# Patient Record
Sex: Female | Born: 1950 | Race: White | Hispanic: No | Marital: Married | Smoking: Former smoker
Health system: Southern US, Community
[De-identification: ages and names within clinical notes are randomized; demographics above are authoritative.]

## PROBLEM LIST (undated history)

## (undated) DIAGNOSIS — F419 Anxiety disorder, unspecified: Secondary | ICD-10-CM

## (undated) DIAGNOSIS — I10 Essential (primary) hypertension: Secondary | ICD-10-CM

## (undated) DIAGNOSIS — M1990 Unspecified osteoarthritis, unspecified site: Secondary | ICD-10-CM

## (undated) DIAGNOSIS — E7800 Pure hypercholesterolemia, unspecified: Secondary | ICD-10-CM

## (undated) DIAGNOSIS — N183 Chronic kidney disease, stage 3 unspecified: Secondary | ICD-10-CM

## (undated) DIAGNOSIS — E119 Type 2 diabetes mellitus without complications: Secondary | ICD-10-CM

## (undated) DIAGNOSIS — J45909 Unspecified asthma, uncomplicated: Secondary | ICD-10-CM

## (undated) HISTORY — DX: Chronic kidney disease, stage 3 unspecified: N18.30

---

## 1997-10-17 ENCOUNTER — Other Ambulatory Visit: Admission: RE | Admit: 1997-10-17 | Discharge: 1997-10-17 | Payer: Self-pay | Admitting: *Deleted

## 1998-11-12 ENCOUNTER — Other Ambulatory Visit: Admission: RE | Admit: 1998-11-12 | Discharge: 1998-11-12 | Payer: Self-pay | Admitting: *Deleted

## 1999-11-18 ENCOUNTER — Other Ambulatory Visit: Admission: RE | Admit: 1999-11-18 | Discharge: 1999-11-18 | Payer: Self-pay | Admitting: *Deleted

## 2000-11-23 ENCOUNTER — Other Ambulatory Visit: Admission: RE | Admit: 2000-11-23 | Discharge: 2000-11-23 | Payer: Self-pay | Admitting: *Deleted

## 2016-08-04 NOTE — Patient Instructions (Addendum)
Jeanne Medina  08/04/2016     @   Your procedure is scheduled on 08/18/2016.  Report to [facility]ina at 7:30 A.M.  Call this number if you have problems the morning of surgery:  [phone]   Remember:  Do not eat food or drink liquids after midnight.  Take these medicines the morning of surgery with A SIP OF WATER Amlodipine, Celexa   Do not wear jewelry, make-up or nail polish.  Do not wear lotions, powders, or perfumes, or deoderant.  Do not shave 48 hours prior to surgery.  Men may shave face and neck.  Do not bring valuables to the hospital.  [facility] is not responsible for any belongings or valuables.  Contacts, dentures or bridgework may not be worn into surgery.  Leave your suitcase in the car.  After surgery it may be brought to your room.  For patients admitted to the hospital, discharge time will be determined by your treatment team.  Patients discharged the day of surgery will not be allowed to drive home.    Please read over the following fact sheets that you were given. Anesthesia Post-op Instructions     PATIENT INSTRUCTIONS POST-ANESTHESIA  IMMEDIATELY FOLLOWING SURGERY:  Do not drive or operate machinery for the first twenty four hours after surgery.  Do not make any important decisions for twenty four hours after surgery or while taking narcotic pain medications or sedatives.  If you develop intractable nausea and vomiting or a severe headache please notify your doctor immediately.  FOLLOW-UP:  Please make an appointment with your surgeon as instructed. You do not need to follow up with anesthesia unless specifically instructed to do so.  WOUND CARE INSTRUCTIONS (if applicable):  Keep a dry clean dressing on the anesthesia/puncture wound site if there is drainage.  Once the wound has quit draining you may leave it open to air.  Generally you should leave the bandage intact for twenty four hours unless there is drainage.  If the  epidural site drains for more than 36-48 hours please call the anesthesia department.  QUESTIONS?:  Please feel free to call your physician or the hospital operator if you have any questions, and they will be happy to assist you.      Cataract Surgery Cataract surgery is a procedure to remove a cataract from your eye. A cataract is cloudiness on the lens of your eye. The lens focuses light inside the eye. When a lens becomes cloudy, your vision is affected. Cataract surgery is a procedure to remove the cloudy lens. A substitute lens (intraocular lens or IOL) is usually inserted as a replacement for the cloudy lens. Tell a health care provider about:  Any allergies you have.  All medicines you are taking, including vitamins, herbs, eye drops, creams, and over-the-counter medicines.  Any problems you or family members have had with anesthetic medicines.  Any blood disorders you have.  Any surgeries you have had, especially eye surgeries that include refractive surgery, such as PRK and LASIK.  Any medical conditions you have.  Whether you are pregnant or may be pregnant. What are the risks? Generally, this is a safe procedure. However, problems may occur, including:  Infection.  Bleeding.  Glaucoma.  Retinal detachment.  Allergic reactions to medicines.  Damage to other structures or organs.  Inflammation of the eye.  Clouding of the part of your eye that holds an IOL in place (after-cataract), if an IOL was inserted. This is fairly common.  An IOL moving out of position, if an IOL was inserted. This is very rare.  Loss of vision. This is rare. What happens before the procedure?  Follow instructions from your health care provider about eating or drinking restrictions.  Ask your health care provider about:  Changing or stopping your regular medicines, including any eye drops you have been prescribed. This is especially important if you are taking diabetes medicines or  blood thinners.  Taking medicines such as aspirin and ibuprofen. These medicines can thin your blood. Do not take these medicines before your procedure if your health care provider instructs you not to.  Do not put contact lenses in either eye on the day of your surgery.  Plan for someone to drive you to and from the procedure.  If you will be going home right after the procedure, plan to have someone with you for 24 hours. What happens during the procedure?  An IV tube may be inserted into one of your veins.  You will be given one or more of the following:  A medicine to help you relax (sedative).  A medicine to numb the area (local anesthetic). This may be numbing eye drops or an injection that is given behind the eye.  A small cut (incision) will be made to the edge of the clear, dome-shaped surface that covers the front of the eye (cornea).  A small probe will be inserted into the eye. This device gives off ultrasound waves that soften and break up the cloudy center of the lens. This makes it easier for the cloudy lens to be removed by suction.  An IOL may be implanted.  Part of the capsule that surrounds the lens will be left in the eye to support the IOL.  Your surgeon may use stitches (sutures) to close the incision. The procedure may vary among health care providers and hospitals. What happens after the procedure?  Your blood pressure, heart rate, breathing rate, and blood oxygen level will be monitored often until the medicines you were given have worn off.  You may be given a protective shield to wear over your eyes.  Do not drive for 24 hours if you received a sedative. This information is not intended to replace advice given to you by your health care provider. Make sure you discuss any questions you have with your health care provider. Document Released: 04/02/2011 Document Revised: 09/19/2015 Document Reviewed: 02/21/2015 Elsevier Interactive Patient Education   2017 [employer].

## 2016-08-10 ENCOUNTER — Encounter (HOSPITAL_COMMUNITY)
Admission: RE | Admit: 2016-08-10 | Discharge: 2016-08-10 | Disposition: A | Discharge status: Home / Self Care | Payer: Medicare Other | Source: Ambulatory Visit | Attending: Ophthalmology | Admitting: Ophthalmology

## 2016-08-10 ENCOUNTER — Encounter (HOSPITAL_COMMUNITY): Payer: Self-pay

## 2016-08-10 DIAGNOSIS — Z01810 Encounter for preprocedural cardiovascular examination: Secondary | ICD-10-CM | POA: Insufficient documentation

## 2016-08-10 DIAGNOSIS — Z01812 Encounter for preprocedural laboratory examination: Secondary | ICD-10-CM | POA: Insufficient documentation

## 2016-08-10 DIAGNOSIS — H269 Unspecified cataract: Secondary | ICD-10-CM | POA: Insufficient documentation

## 2016-08-12 DIAGNOSIS — Z01810 Encounter for preprocedural cardiovascular examination: Secondary | ICD-10-CM | POA: Diagnosis present

## 2016-08-12 DIAGNOSIS — Z01812 Encounter for preprocedural laboratory examination: Secondary | ICD-10-CM | POA: Diagnosis present

## 2016-08-12 DIAGNOSIS — H269 Unspecified cataract: Secondary | ICD-10-CM | POA: Diagnosis not present

## 2016-08-12 MED ORDER — KETOROLAC TROMETHAMINE 0.5 % OP SOLN: 1.0000 [drp] | OPHTHALMIC | Status: DC

## 2016-08-12 MED ORDER — CYCLOPENTOLATE-PHENYLEPHRINE 0.2-1 % OP SOLN: 1.0000 [drp] | OPHTHALMIC | Status: DC

## 2016-08-12 MED ORDER — PHENYLEPHRINE HCL 2.5 % OP SOLN: 1.0000 [drp] | OPHTHALMIC | Status: DC

## 2016-08-12 MED ORDER — TETRACAINE HCL 0.5 % OP SOLN: 1.0000 [drp] | OPHTHALMIC | Status: DC

## 2016-08-12 NOTE — Patient Instructions (Signed)
Your procedure is scheduled on: 08/18/2016  Report to [facility] at  730  AM.  Call this number if you have problems the morning of surgery: [phone]   Do not eat food or drink liquids :After Midnight.      Take these medicines the morning of surgery with A SIP OF WATER: amlodipine, celexa, lisinopril.   Do not wear jewelry, make-up or nail polish.  Do not wear lotions, powders, or perfumes. You may wear deodorant.  Do not shave 48 hours prior to surgery.  Do not bring valuables to the hospital.  Contacts, dentures or bridgework may not be worn into surgery.  Leave suitcase in the car. After surgery it may be brought to your room.  For patients admitted to the hospital, checkout time is 11:00 AM the day of discharge.   Patients discharged the day of surgery will not be allowed to drive home.  :     Please read over the following fact sheets that you were given: Coughing and Deep Breathing, Surgical Site Infection Prevention, Anesthesia Post-op Instructions and Care and Recovery After Surgery    Cataract A cataract is a clouding of the lens of the eye. When a lens becomes cloudy, vision is reduced based on the degree and nature of the clouding. Many cataracts reduce vision to some degree. Some cataracts make people more near-sighted as they develop. Other cataracts increase glare. Cataracts that are ignored and become worse can sometimes look white. The white color can be seen through the pupil. CAUSES   Aging. However, cataracts may occur at any age, even in newborns.   Certain drugs.   Trauma to the eye.   Certain diseases such as diabetes.   Specific eye diseases such as chronic inflammation inside the eye or a sudden attack of a rare form of glaucoma.   Inherited or acquired medical problems.  SYMPTOMS   Gradual, progressive drop in vision in the affected eye.   Severe, rapid visual loss. This most often happens when trauma is the cause.  DIAGNOSIS  To detect a  cataract, an eye doctor examines the lens. Cataracts are best diagnosed with an exam of the eyes with the pupils enlarged (dilated) by drops.  TREATMENT  For an early cataract, vision may improve by using different eyeglasses or stronger lighting. If that does not help your vision, surgery is the only effective treatment. A cataract needs to be surgically removed when vision loss interferes with your everyday activities, such as driving, reading, or watching TV. A cataract may also have to be removed if it prevents examination or treatment of another eye problem. Surgery removes the cloudy lens and usually replaces it with a substitute lens (intraocular lens, IOL).  At a time when both you and your doctor agree, the cataract will be surgically removed. If you have cataracts in both eyes, only one is usually removed at a time. This allows the operated eye to heal and be out of danger from any possible problems after surgery (such as infection or poor wound healing). In rare cases, a cataract may be doing damage to your eye. In these cases, your caregiver may advise surgical removal right away. The vast majority of people who have cataract surgery have better vision afterward. HOME CARE INSTRUCTIONS  If you are not planning surgery, you may be asked to do the following:  Use different eyeglasses.   Use stronger or brighter lighting.   Ask your eye doctor about reducing your medicine  dose or changing medicines if it is thought that a medicine caused your cataract. Changing medicines does not make the cataract go away on its own.   Become familiar with your surroundings. Poor vision can lead to injury. Avoid bumping into things on the affected side. You are at a higher risk for tripping or falling.   Exercise extreme care when driving or operating machinery.   Wear sunglasses if you are sensitive to bright light or experiencing problems with glare.  SEEK IMMEDIATE MEDICAL CARE IF:   You have a  worsening or sudden vision loss.   You notice redness, swelling, or increasing pain in the eye.   You have a fever.  Document Released: 04/13/2005 Document Revised: 04/02/2011 Document Reviewed: 12/05/2010 [facility] Patient Information 2012 [place].PATIENT INSTRUCTIONS POST-ANESTHESIA  IMMEDIATELY FOLLOWING SURGERY:  Do not drive or operate machinery for the first twenty four hours after surgery.  Do not make any important decisions for twenty four hours after surgery or while taking narcotic pain medications or sedatives.  If you develop intractable nausea and vomiting or a severe headache please notify your doctor immediately.  FOLLOW-UP:  Please make an appointment with your surgeon as instructed. You do not need to follow up with anesthesia unless specifically instructed to do so.  WOUND CARE INSTRUCTIONS (if applicable):  Keep a dry clean dressing on the anesthesia/puncture wound site if there is drainage.  Once the wound has quit draining you may leave it open to air.  Generally you should leave the bandage intact for twenty four hours unless there is drainage.  If the epidural site drains for more than 36-48 hours please call the anesthesia department.  QUESTIONS?:  Please feel free to call your physician or the hospital operator if you have any questions, and they will be happy to assist you.

## 2016-08-13 ENCOUNTER — Encounter (HOSPITAL_COMMUNITY): Payer: Self-pay

## 2016-08-13 ENCOUNTER — Other Ambulatory Visit: Payer: Self-pay

## 2016-08-13 ENCOUNTER — Encounter (HOSPITAL_COMMUNITY)
Admission: RE | Admit: 2016-08-13 | Discharge: 2016-08-13 | Disposition: A | Discharge status: Home / Self Care | Payer: Medicare Other | Source: Ambulatory Visit | Attending: Ophthalmology | Admitting: Ophthalmology

## 2016-08-13 DIAGNOSIS — Z01812 Encounter for preprocedural laboratory examination: Secondary | ICD-10-CM | POA: Diagnosis not present

## 2016-08-13 DIAGNOSIS — H269 Unspecified cataract: Secondary | ICD-10-CM | POA: Diagnosis not present

## 2016-08-13 DIAGNOSIS — Z01810 Encounter for preprocedural cardiovascular examination: Secondary | ICD-10-CM | POA: Diagnosis not present

## 2016-08-13 HISTORY — DX: Anxiety disorder, unspecified: F41.9

## 2016-08-13 HISTORY — DX: Unspecified asthma, uncomplicated: J45.909

## 2016-08-13 HISTORY — DX: Unspecified osteoarthritis, unspecified site: M19.90

## 2016-08-13 HISTORY — DX: Pure hypercholesterolemia, unspecified: E78.00

## 2016-08-13 HISTORY — DX: Type 2 diabetes mellitus without complications: E11.9

## 2016-08-13 HISTORY — DX: Essential (primary) hypertension: I10

## 2016-08-13 LAB — CBC WITH DIFFERENTIAL/PLATELET
BASOS ABS: 0.1 10*3/uL (ref 0.0–0.1)
Basophils Relative: 1 %
EOS PCT: 5 %
Eosinophils Absolute: 0.5 10*3/uL (ref 0.0–0.7)
HEMATOCRIT: 40.3 % (ref 36.0–46.0)
HEMOGLOBIN: 13.6 g/dL (ref 12.0–15.0)
LYMPHS ABS: 2.9 10*3/uL (ref 0.7–4.0)
LYMPHS PCT: 27 %
MCH: 29.1 pg (ref 26.0–34.0)
MCHC: 33.7 g/dL (ref 30.0–36.0)
MCV: 86.3 fL (ref 78.0–100.0)
Monocytes Absolute: 0.7 10*3/uL (ref 0.1–1.0)
Monocytes Relative: 7 %
NEUTROS ABS: 6.4 10*3/uL (ref 1.7–7.7)
Neutrophils Relative %: 60 %
Platelets: 422 10*3/uL — ABNORMAL HIGH (ref 150–400)
RBC: 4.67 MIL/uL (ref 3.87–5.11)
RDW: 13.4 % (ref 11.5–15.5)
WBC: 10.5 10*3/uL (ref 4.0–10.5)

## 2016-08-13 LAB — BASIC METABOLIC PANEL
ANION GAP: 12 (ref 5–15)
BUN: 16 mg/dL (ref 6–20)
CHLORIDE: 100 mmol/L — AB (ref 101–111)
CO2: 25 mmol/L (ref 22–32)
Calcium: 9.5 mg/dL (ref 8.9–10.3)
Creatinine, Ser: 0.97 mg/dL (ref 0.44–1.00)
GFR calc Af Amer: >60 mL/min (ref >60)
GFR, EST NON AFRICAN AMERICAN: 60 mL/min — AB (ref >60)
GLUCOSE: 198 mg/dL — AB (ref 65–99)
POTASSIUM: 4 mmol/L (ref 3.5–5.1)
Sodium: 137 mmol/L (ref 135–145)

## 2016-08-18 ENCOUNTER — Encounter (HOSPITAL_COMMUNITY): Payer: Self-pay | Admitting: *Deleted

## 2016-08-18 ENCOUNTER — Ambulatory Visit (HOSPITAL_COMMUNITY)
Admission: RE | Admit: 2016-08-18 | Discharge: 2016-08-18 | Disposition: A | Discharge status: Home / Self Care | Payer: Medicare Other | Source: Ambulatory Visit | Attending: Ophthalmology | Admitting: Ophthalmology

## 2016-08-18 ENCOUNTER — Ambulatory Visit (HOSPITAL_COMMUNITY): Payer: Medicare Other | Admitting: Anesthesiology

## 2016-08-18 ENCOUNTER — Encounter (HOSPITAL_COMMUNITY)
Admission: RE | Disposition: A | Discharge status: Home / Self Care | Payer: Self-pay | Source: Ambulatory Visit | Attending: Ophthalmology

## 2016-08-18 DIAGNOSIS — Z87891 Personal history of nicotine dependence: Secondary | ICD-10-CM | POA: Diagnosis not present

## 2016-08-18 DIAGNOSIS — I10 Essential (primary) hypertension: Secondary | ICD-10-CM | POA: Diagnosis not present

## 2016-08-18 DIAGNOSIS — Z79899 Other long term (current) drug therapy: Secondary | ICD-10-CM | POA: Insufficient documentation

## 2016-08-18 DIAGNOSIS — F419 Anxiety disorder, unspecified: Secondary | ICD-10-CM | POA: Diagnosis not present

## 2016-08-18 DIAGNOSIS — J45909 Unspecified asthma, uncomplicated: Secondary | ICD-10-CM | POA: Insufficient documentation

## 2016-08-18 DIAGNOSIS — E1136 Type 2 diabetes mellitus with diabetic cataract: Secondary | ICD-10-CM | POA: Diagnosis not present

## 2016-08-18 HISTORY — PX: CATARACT EXTRACTION W/PHACO: SHX586

## 2016-08-18 LAB — GLUCOSE, CAPILLARY: GLUCOSE-CAPILLARY: 137 mg/dL — AB (ref 65–99)

## 2016-08-18 SURGERY — PHACOEMULSIFICATION, CATARACT, WITH IOL INSERTION
Anesthesia: Monitor Anesthesia Care | Site: Eye | Laterality: Left

## 2016-08-18 MED ORDER — FENTANYL CITRATE (PF) 100 MCG/2ML IJ SOLN
INTRAMUSCULAR | Status: AC
  Filled 2016-08-18: qty 2

## 2016-08-18 MED ORDER — TETRACAINE HCL 0.5 % OP SOLN
1.0000 [drp] | OPHTHALMIC | Status: AC
  Administered 2016-08-18 (×3): 1 [drp] via OPHTHALMIC

## 2016-08-18 MED ORDER — LIDOCAINE HCL (PF) 1 % IJ SOLN
INTRAMUSCULAR | Status: DC | PRN
  Administered 2016-08-18: 1 mL

## 2016-08-18 MED ORDER — CYCLOPENTOLATE-PHENYLEPHRINE 0.2-1 % OP SOLN
1.0000 [drp] | OPHTHALMIC | Status: AC
Start: 2016-08-18 — End: 2016-08-18
  Administered 2016-08-18 (×3): 1 [drp] via OPHTHALMIC

## 2016-08-18 MED ORDER — PROVISC 10 MG/ML IO SOLN
INTRAOCULAR | Status: DC | PRN
  Administered 2016-08-18: 0.85 mL via INTRAOCULAR

## 2016-08-18 MED ORDER — EPINEPHRINE PF 1 MG/ML IJ SOLN
INTRAMUSCULAR | Status: DC | PRN
  Administered 2016-08-18: 09:00:00

## 2016-08-18 MED ORDER — MIDAZOLAM HCL 2 MG/2ML IJ SOLN
INTRAMUSCULAR | Status: AC
  Filled 2016-08-18: qty 2

## 2016-08-18 MED ORDER — LIDOCAINE HCL (PF) 1 % IJ SOLN
INTRAMUSCULAR | Status: AC
Start: 2016-08-18 — End: ?
  Filled 2016-08-18: qty 2

## 2016-08-18 MED ORDER — LACTATED RINGERS IV SOLN
INTRAVENOUS | Status: DC
  Administered 2016-08-18: 09:00:00 via INTRAVENOUS

## 2016-08-18 MED ORDER — PHENYLEPHRINE HCL 2.5 % OP SOLN
1.0000 [drp] | OPHTHALMIC | Status: AC
  Administered 2016-08-18 (×3): 1 [drp] via OPHTHALMIC

## 2016-08-18 MED ORDER — MIDAZOLAM HCL 2 MG/2ML IJ SOLN
1.0000 mg | INTRAMUSCULAR | Status: AC
Start: 2016-08-18 — End: 2016-08-18
  Administered 2016-08-18: 2 mg via INTRAVENOUS

## 2016-08-18 MED ORDER — FENTANYL CITRATE (PF) 100 MCG/2ML IJ SOLN
25.0000 ug | Freq: Once | INTRAMUSCULAR | Status: AC
  Administered 2016-08-18: 25 ug via INTRAVENOUS

## 2016-08-18 MED ORDER — KETOROLAC TROMETHAMINE 0.5 % OP SOLN
1.0000 [drp] | OPHTHALMIC | Status: AC
  Administered 2016-08-18 (×3): 1 [drp] via OPHTHALMIC

## 2016-08-18 MED ORDER — BSS IO SOLN
INTRAOCULAR | Status: DC | PRN
  Administered 2016-08-18: 15 mL via INTRAOCULAR

## 2016-08-18 SURGICAL SUPPLY — 9 items
CLOTH BEACON ORANGE TIMEOUT ST (SAFETY) ×2 IMPLANT
EYE SHIELD UNIVERSAL CLEAR (GAUZE/BANDAGES/DRESSINGS) ×2 IMPLANT
GLOVE EXAM NITRILE MD LF STRL (GLOVE) ×2 IMPLANT
LENS ALC ACRYL/TECN (Ophthalmic Related) ×3 IMPLANT
PAD ARMBOARD 7.5X6 YLW CONV (MISCELLANEOUS) ×2 IMPLANT
RING MALYGIN (MISCELLANEOUS) ×2 IMPLANT
TAPE SURG TRANSPARENT 2IN (GAUZE/BANDAGES/DRESSINGS) IMPLANT
TAPE TRANSPARENT 2IN (GAUZE/BANDAGES/DRESSINGS) ×2
WATER STERILE IRR 250ML POUR (IV SOLUTION) ×2 IMPLANT

## 2016-08-18 NOTE — Discharge Instructions (Signed)
PATIENT INSTRUCTIONS °POST-ANESTHESIA ° °IMMEDIATELY FOLLOWING SURGERY:  Do not drive or operate machinery for the first twenty four hours after surgery.  Do not make any important decisions for twenty four hours after surgery or while taking narcotic pain medications or sedatives.  If you develop intractable nausea and vomiting or a severe headache please notify your doctor immediately. ° °FOLLOW-UP:  Please make an appointment with your surgeon as instructed. You do not need to follow up with anesthesia unless specifically instructed to do so. ° °WOUND CARE INSTRUCTIONS (if applicable):  Keep a dry clean dressing on the anesthesia/puncture wound site if there is drainage.  Once the wound has quit draining you may leave it open to air.  Generally you should leave the bandage intact for twenty four hours unless there is drainage.  If the epidural site drains for more than 36-48 hours please call the anesthesia department. ° ° ° °Cataract °A cataract is cloudiness on the lens of your eye. The lens is the clear part of your eye that is behind your iris and pupil. The lens focuses light on the retina, which lets you see clearly. °When a lens becomes cloudy, vision may become blurry. The clouding can range from a tiny dot to complete cloudiness. As some cataracts develop, they make a person more nearsighted. Other cataracts increase glare. Cataracts can worsen over time, and sometimes the pupil can look white. Cataracts get bigger and they cloud more of the lens, making it difficult to see. Cataracts can affect one eye or both eyes. °What are the causes? °Most cataracts are associated with age-related eye changes. The eye lens is mostly made up of water and protein. Normally, this protein is arranged in a way that keeps the lens clear. Cataracts develop when protein begins to clump together over time. This clouds the lens and lets less light pass through to the retina, which causes blurry vision. °What increases the  risk? °This condition is more likely to develop in people who: °· Are 60 years of age or older. °· Have diabetes. °· Have high blood pressure. °· Take certain medicines, such as steroids or hormone replacement therapy. °· Have had an eye injury. °· Have or have had eye inflammation. °· Have a family history of cataracts. °· Smoke. °· Drink alcohol heavily. °· Are frequently exposed to sun or very strong light without eye protection. °· Are obese. °· Have been exposed to large amounts of radiation, lead, or other toxic substances. °· Have had eye surgery. °What are the signs or symptoms? °The main symptom of a cataract is blurry vision. Your vision may change or get worse over time. Other symptoms include: °· Increased glare. °· Seeing a bright ring or halo around light. °· Poor night vision. °· Double vision in one eye. °· Having trouble seeing, even while wearing contact lenses or glasses. °· Seeing colors that appear faded. °· Trouble telling the difference between blue and purple. °· Needing frequent changes to your prescription glasses or contacts. °How is this diagnosed? °This condition is diagnosed with a medical history and eye exam. You may need to see an eye specialist (optometrist or ophthalmologist). Your health care provider may enlarge (dilate) your pupils with eye drops to see the back of your eye more clearly and look for signs of cataracts or other damage. °You may also have tests, including: °· A visual acuity test. This uses a chart to determine the smallest letters that you can see from a specific distance. °·   A slit-lamp exam. This uses a microscope to examine small sections of your eye for abnormalities. °· Tonometry. This test measures the pressure of the fluid inside your eye. °How is this treated? °Treatment depends on the stage of your cataract. For an early cataract, vision may improve by using different eyeglasses or stronger lighting. If that does not help your vision, surgery may be  recommended to remove the cataract. °If your health care provider thinks your cataract may be linked to any medicines that you are taking, he or she may change your medicines. °Follow these instructions at home: °Lifestyle  °· Use stronger or brighter lighting. °· Consider using a magnifying glass for reading or other activities. °· Become familiar with your surroundings. Having poor vision can put you at a greater risk for tripping, falling, or bumping into things. °· Wear sunglasses and a hat if you are sensitive to bright light or are having problems with glare. °· Quit smoking if you smoke. If you need help quitting, talk with your health care provider. °General instructions  °· If you are prescribed new eyeglasses, wear them as told by your health care provider. °· Take over-the-counter and prescription medicines only as told by your health care provider. Do not change your medicines unless told by your health care provider. °· Do not drive or operate heavy machinery if your vision is blurry, particularly at night. °· Keep your blood sugar under control, if you have diabetes. °· Keep all follow-up visits as told by your health care provider. This is important. °Contact a health care provider if: °· Your symptoms get worse. °· Your vision affects your ability to perform daily activities. °· You have new symptoms. °· You have a fever. °Get help right away if: °· You have sudden vision loss. °· You have redness, swelling, or increasing pain in your eye. °· You develop a headache and sensitivity to light. °This information is not intended to replace advice given to you by your health care provider. Make sure you discuss any questions you have with your health care provider. °Document Released: 04/13/2005 Document Revised: 08/22/2015 Document Reviewed: 10/17/2014 °Elsevier Interactive Patient Education © 2017 [employer] ° °QUESTIONS?:  Please feel free to call your physician or the hospital operator if you have  any questions, and they will be happy to assist you.    ° °  °  °        [facility] Instructions °[address]- [place] [address] °    ° °1. Avoid closing eyes tightly. One often closes the eye tightly when laughing, talking, sneezing, coughing or if they feel irritated. At these times, you should be careful not to close your eyes tightly. ° °2. Instill eye drops as instructed. To instill drops in your eye, open it, look up and have someone gently pull the lower lid down and instill a couple of drops inside the lower lid. ° °3. Do not touch upper lid. ° °4. Take Advil or Tylenol for pain. ° °5. You may use either eye for near work, such as reading or sewing and you may watch television. ° °6. You may have your hair done at the beauty parlor at any time. ° °7. Wear dark glasses with or without your own glasses if you are in bright light. ° °8. Call our office at [phone] or [phone] if you have sharp pain in your eye or unusual symptoms. ° °9.  FOLLOW UP WITH DR. SHAPIRO   TODAY IN HIS [place] OFFICE AT 3:15 pm. ° °  °I have received a copy of the above instructions and will follow them.  ° ° ° °IF YOU ARE IN IMMEDIATE DANGER CALL 911! ° °It is important for you to keep your follow-up appointment with your physician after discharge, OR, for you /your caregiver to make a follow-up appointment with your physician / medical provider after discharge. ° °Show these instructions to the next healthcare provider you see. ° ° ° °

## 2016-08-18 NOTE — Anesthesia Preprocedure Evaluation (Signed)
Anesthesia Evaluation  Patient identified by MRN, date of birth, ID band Patient awake    Reviewed: Allergy & Precautions, NPO status , Patient's Chart, lab work & pertinent test results  Airway Mallampati: II  TM Distance: >3 FB     Dental  (+) Teeth Intact   Pulmonary asthma , former smoker,    breath sounds clear to auscultation       Cardiovascular hypertension, Pt. on medications  Rhythm:Regular Rate:Normal     Neuro/Psych PSYCHIATRIC DISORDERS Anxiety    GI/Hepatic negative GI ROS,   Endo/Other  diabetes, Type 2  Renal/GU      Musculoskeletal   Abdominal   Peds  Hematology   Anesthesia Other Findings   Reproductive/Obstetrics                             Anesthesia Physical Anesthesia Plan  ASA: III  Anesthesia Plan: MAC   Post-op Pain Management:    Induction: Intravenous  Airway Management Planned: Nasal Cannula  Additional Equipment:   Intra-op Plan:   Post-operative Plan:   Informed Consent: I have reviewed the patients History and Physical, chart, labs and discussed the procedure including the risks, benefits and alternatives for the proposed anesthesia with the patient or authorized representative who has indicated his/her understanding and acceptance.     Plan Discussed with:   Anesthesia Plan Comments:         Anesthesia Quick Evaluation  

## 2016-08-18 NOTE — H&P (Signed)
The patient was re examined and there is no change in the patients condition since the original H and P. 

## 2016-08-18 NOTE — Transfer of Care (Signed)
Immediate Anesthesia Transfer of Care Note  Patient: Jeanne Medina  Procedure(s) Performed: Procedure(s) with comments: CATARACT EXTRACTION PHACO AND INTRAOCULAR LENS PLACEMENT (IOC) (Left) - CDE: 6.05  Patient Location: Short Stay  Anesthesia Type:MAC  Level of Consciousness: awake and alert   Airway & Oxygen Therapy: Patient Spontanous Breathing  Post-op Assessment: Report given to RN  Post vital signs: Reviewed  Last Vitals:  Vitals:   08/18/16 0915 08/18/16 0920  BP: 124/71 (!) 148/67  Resp: 11 19  Temp:      Last Pain:  Vitals:   08/18/16 0848  TempSrc: Oral         Complications: No apparent anesthesia complications

## 2016-08-18 NOTE — Anesthesia Postprocedure Evaluation (Signed)
Anesthesia Post Note  Patient: Jeanne Medina  Procedure(s) Performed: Procedure(s) (LRB): CATARACT EXTRACTION PHACO AND INTRAOCULAR LENS PLACEMENT (IOC) (Left)  Patient location during evaluation: Short Stay Anesthesia Type: MAC Level of consciousness: awake and alert and oriented Pain management: pain level controlled Vital Signs Assessment: post-procedure vital signs reviewed and stable Respiratory status: spontaneous breathing Cardiovascular status: blood pressure returned to baseline Postop Assessment: no signs of nausea or vomiting Anesthetic complications: no     Last Vitals:  Vitals:   08/18/16 0915 08/18/16 0920  BP: 124/71 (!) 148/67  Resp: 11 19  Temp:      Last Pain:  Vitals:   08/18/16 0848  TempSrc: Oral                 Paralee Pendergrass

## 2016-08-18 NOTE — Op Note (Signed)
Patient brought to the operating room and prepped and draped in the usual manner.  Lid speculum inserted in left eye.  Stab incision made at the twelve o'clock position.  Intraocular Xylocaine instilled. Provisc instilled in the anterior chamber.   A 2.4 mm. Stab incision was made temporally. Due to a small pupil, a Malugyn Ring was inserted.  An anterior capsulotomy was done with a bent 25 gauge needle.  The nucleus was hydrodissected.  The Phaco tip was inserted in the anterior chamber and the nucleus was emulsified.  CDE was 6.05.  The cortical material was then removed with the I and A tip.  Posterior capsule was the polished.  The anterior chamber was deepened with Provisc.  A 19.5 Diopter Alcon [id number] IOL was then inserted in the capsular bag.  Provisc was then removed with the I and A tip.  The wound was then hydrated.  Patient sent to the Recovery Room in good condition with follow up in my office.  Preoperative Diagnosis:  Nuclear and PSC Cataract OS Postoperative Diagnosis:  Same Procedure name: Kelman Phacoemulsification OS with  IOL

## 2016-08-19 ENCOUNTER — Encounter (HOSPITAL_COMMUNITY): Payer: Self-pay | Admitting: Ophthalmology

## 2016-08-27 ENCOUNTER — Encounter (HOSPITAL_COMMUNITY)
Admission: RE | Admit: 2016-08-27 | Discharge: 2016-08-27 | Disposition: A | Discharge status: Home / Self Care | Payer: Medicare Other | Source: Ambulatory Visit | Attending: Ophthalmology | Admitting: Ophthalmology

## 2016-08-27 ENCOUNTER — Encounter (HOSPITAL_COMMUNITY): Payer: Self-pay

## 2016-08-27 ENCOUNTER — Inpatient Hospital Stay (HOSPITAL_COMMUNITY): Admission: RE | Admit: 2016-08-27 | Payer: Self-pay | Source: Ambulatory Visit

## 2016-08-31 MED ORDER — KETOROLAC TROMETHAMINE 0.5 % OP SOLN
OPHTHALMIC | Status: AC
  Filled 2016-08-31: qty 5

## 2016-08-31 MED ORDER — PHENYLEPHRINE HCL 2.5 % OP SOLN
OPHTHALMIC | Status: AC
  Filled 2016-08-31: qty 15

## 2016-08-31 MED ORDER — CYCLOPENTOLATE-PHENYLEPHRINE 0.2-1 % OP SOLN
OPHTHALMIC | Status: AC
  Filled 2016-08-31: qty 2

## 2016-08-31 MED ORDER — TETRACAINE HCL 0.5 % OP SOLN
OPHTHALMIC | Status: AC
  Filled 2016-08-31: qty 4

## 2016-09-01 ENCOUNTER — Ambulatory Visit (HOSPITAL_COMMUNITY): Payer: Medicare Other | Admitting: Anesthesiology

## 2016-09-01 ENCOUNTER — Ambulatory Visit (HOSPITAL_COMMUNITY)
Admission: RE | Admit: 2016-09-01 | Discharge: 2016-09-01 | Disposition: A | Discharge status: Home / Self Care | Payer: Medicare Other | Source: Ambulatory Visit | Attending: Ophthalmology | Admitting: Ophthalmology

## 2016-09-01 ENCOUNTER — Encounter (HOSPITAL_COMMUNITY): Payer: Self-pay | Admitting: *Deleted

## 2016-09-01 ENCOUNTER — Encounter (HOSPITAL_COMMUNITY)
Admission: RE | Disposition: A | Discharge status: Home / Self Care | Payer: Self-pay | Source: Ambulatory Visit | Attending: Ophthalmology

## 2016-09-01 DIAGNOSIS — F419 Anxiety disorder, unspecified: Secondary | ICD-10-CM | POA: Diagnosis not present

## 2016-09-01 DIAGNOSIS — Z87891 Personal history of nicotine dependence: Secondary | ICD-10-CM | POA: Diagnosis not present

## 2016-09-01 DIAGNOSIS — Z79899 Other long term (current) drug therapy: Secondary | ICD-10-CM | POA: Insufficient documentation

## 2016-09-01 DIAGNOSIS — J45909 Unspecified asthma, uncomplicated: Secondary | ICD-10-CM | POA: Diagnosis not present

## 2016-09-01 DIAGNOSIS — I10 Essential (primary) hypertension: Secondary | ICD-10-CM | POA: Diagnosis not present

## 2016-09-01 DIAGNOSIS — E1136 Type 2 diabetes mellitus with diabetic cataract: Secondary | ICD-10-CM | POA: Insufficient documentation

## 2016-09-01 HISTORY — PX: CATARACT EXTRACTION W/PHACO: SHX586

## 2016-09-01 LAB — GLUCOSE, CAPILLARY: Glucose-Capillary: 157 mg/dL — ABNORMAL HIGH (ref 65–99)

## 2016-09-01 SURGERY — PHACOEMULSIFICATION, CATARACT, WITH IOL INSERTION
Anesthesia: Monitor Anesthesia Care | Laterality: Right

## 2016-09-01 SURGERY — PHACOEMULSIFICATION, CATARACT, WITH IOL INSERTION
Anesthesia: Monitor Anesthesia Care | Site: Eye | Laterality: Left

## 2016-09-01 MED ORDER — KETOROLAC TROMETHAMINE 0.5 % OP SOLN
1.0000 [drp] | OPHTHALMIC | Status: AC
  Administered 2016-09-01 (×3): 1 [drp] via OPHTHALMIC

## 2016-09-01 MED ORDER — FENTANYL CITRATE (PF) 100 MCG/2ML IJ SOLN
INTRAMUSCULAR | Status: AC
  Filled 2016-09-01: qty 2

## 2016-09-01 MED ORDER — TETRACAINE HCL 0.5 % OP SOLN
1.0000 [drp] | OPHTHALMIC | Status: AC
  Administered 2016-09-01 (×3): 1 [drp] via OPHTHALMIC

## 2016-09-01 MED ORDER — LACTATED RINGERS IV SOLN
INTRAVENOUS | Status: DC
  Administered 2016-09-01: 1000 mL via INTRAVENOUS

## 2016-09-01 MED ORDER — EPINEPHRINE PF 1 MG/ML IJ SOLN
INTRAMUSCULAR | Status: DC | PRN
  Administered 2016-09-01: 500 mL

## 2016-09-01 MED ORDER — TETRACAINE 0.5 % OP SOLN OPTIME - NO CHARGE
OPHTHALMIC | Status: DC | PRN
  Administered 2016-09-01: 2 [drp] via OPHTHALMIC

## 2016-09-01 MED ORDER — MIDAZOLAM HCL 2 MG/2ML IJ SOLN
INTRAMUSCULAR | Status: AC
  Filled 2016-09-01: qty 2

## 2016-09-01 MED ORDER — PROVISC 10 MG/ML IO SOLN
INTRAOCULAR | Status: DC | PRN
  Administered 2016-09-01: 0.85 mL via INTRAOCULAR

## 2016-09-01 MED ORDER — BSS IO SOLN
INTRAOCULAR | Status: DC | PRN
Start: 2016-09-01 — End: 2016-09-01
  Administered 2016-09-01: 15 mL

## 2016-09-01 MED ORDER — MIDAZOLAM HCL 2 MG/2ML IJ SOLN
1.0000 mg | INTRAMUSCULAR | Status: AC
  Administered 2016-09-01: 2 mg via INTRAVENOUS

## 2016-09-01 MED ORDER — CYCLOPENTOLATE-PHENYLEPHRINE 0.2-1 % OP SOLN
1.0000 [drp] | OPHTHALMIC | Status: AC
  Administered 2016-09-01 (×3): 1 [drp] via OPHTHALMIC

## 2016-09-01 MED ORDER — FENTANYL CITRATE (PF) 100 MCG/2ML IJ SOLN
25.0000 ug | Freq: Once | INTRAMUSCULAR | Status: AC
  Administered 2016-09-01: 25 ug via INTRAVENOUS

## 2016-09-01 MED ORDER — PHENYLEPHRINE HCL 2.5 % OP SOLN
1.0000 [drp] | OPHTHALMIC | Status: AC
  Administered 2016-09-01 (×3): 1 [drp] via OPHTHALMIC

## 2016-09-01 SURGICAL SUPPLY — 11 items
CLOTH BEACON ORANGE TIMEOUT ST (SAFETY) ×2 IMPLANT
EYE SHIELD UNIVERSAL CLEAR (GAUZE/BANDAGES/DRESSINGS) ×2 IMPLANT
GLOVE BIO SURGEON STRL SZ 6.5 (GLOVE) ×1 IMPLANT
GLOVE BIO SURGEONS STRL SZ 6.5 (GLOVE) ×1
GLOVE EXAM NITRILE MD LF STRL (GLOVE) ×2 IMPLANT
PAD ARMBOARD 7.5X6 YLW CONV (MISCELLANEOUS) ×2 IMPLANT
PROC W NO LENS (INTRAOCULAR LENS) ×3
PROCESS W NO LENS (INTRAOCULAR LENS) IMPLANT
TAPE SURG TRANSPORE 1 IN (GAUZE/BANDAGES/DRESSINGS) IMPLANT
TAPE SURGICAL TRANSPORE 1 IN (GAUZE/BANDAGES/DRESSINGS) ×2
WATER STERILE IRR 250ML POUR (IV SOLUTION) ×2 IMPLANT

## 2016-09-01 NOTE — Anesthesia Postprocedure Evaluation (Signed)
Anesthesia Post Note  Patient: Jeanne Medina  Procedure(s) Performed: Procedure(s) (LRB): REPOSITIONING OF LEFT IOL LENS (Left)  Patient location during evaluation: Short Stay Anesthesia Type: MAC Level of consciousness: awake and alert and oriented Pain management: pain level controlled Vital Signs Assessment: post-procedure vital signs reviewed and stable Respiratory status: spontaneous breathing Cardiovascular status: stable Postop Assessment: no signs of nausea or vomiting Anesthetic complications: no     Last Vitals:  Vitals:   09/01/16 0645 09/01/16 0700  BP: (!) 141/73 136/70  Resp: 16 16    Last Pain: There were no vitals filed for this visit.               Toshika Parrow A

## 2016-09-01 NOTE — Anesthesia Procedure Notes (Signed)
Procedure Name: MAC Date/Time: 09/01/2016 7:20 AM Performed by: Andree Elk, AMY A Pre-anesthesia Checklist: Patient identified, Timeout performed, Emergency Drugs available, Suction available and Patient being monitored Oxygen Delivery Method: Nasal cannula

## 2016-09-01 NOTE — H&P (Signed)
The patient was re examined and there is no change in the patients condition since the original H and P. 

## 2016-09-01 NOTE — Transfer of Care (Signed)
Immediate Anesthesia Transfer of Care Note  Patient: Jeanne Medina  Procedure(s) Performed: Procedure(s): REPOSITIONING OF LEFT IOL LENS (Left)  Patient Location: Short Stay  Anesthesia Type:MAC  Level of Consciousness: awake, alert , oriented and patient cooperative  Airway & Oxygen Therapy: Patient Spontanous Breathing  Post-op Assessment: Report given to RN and Post -op Vital signs reviewed and stable  Post vital signs: Reviewed and stable  Last Vitals:  Vitals:   09/01/16 0645 09/01/16 0700  BP: (!) 141/73 136/70  Resp: 16 16    Last Pain: There were no vitals filed for this visit.       Complications: No apparent anesthesia complications

## 2016-09-01 NOTE — Anesthesia Preprocedure Evaluation (Signed)
Anesthesia Evaluation  Patient identified by MRN, date of birth, ID band Patient awake    Reviewed: Allergy & Precautions, NPO status , Patient's Chart, lab work & pertinent test results  Airway Mallampati: II  TM Distance: >3 FB     Dental  (+) Teeth Intact   Pulmonary asthma , former smoker,    breath sounds clear to auscultation       Cardiovascular hypertension, Pt. on medications  Rhythm:Regular Rate:Normal     Neuro/Psych PSYCHIATRIC DISORDERS Anxiety    GI/Hepatic negative GI ROS,   Endo/Other  diabetes, Type 2  Renal/GU      Musculoskeletal   Abdominal   Peds  Hematology   Anesthesia Other Findings   Reproductive/Obstetrics                             Anesthesia Physical Anesthesia Plan  ASA: III  Anesthesia Plan: MAC   Post-op Pain Management:    Induction: Intravenous  Airway Management Planned: Nasal Cannula  Additional Equipment:   Intra-op Plan:   Post-operative Plan:   Informed Consent: I have reviewed the patients History and Physical, chart, labs and discussed the procedure including the risks, benefits and alternatives for the proposed anesthesia with the patient or authorized representative who has indicated his/her understanding and acceptance.     Plan Discussed with:   Anesthesia Plan Comments:         Anesthesia Quick Evaluation

## 2016-09-01 NOTE — Op Note (Signed)
Patient brought to the operating room and prepped and draped in the usual manner.  Lid speculum inserted in left eye.  Stab incision made at the twelve o'clock position.  Provisc instilled in the anterior chamber.   A 2.4 mm. Stab incision was made temporally. The previous IOL was partially in the anterior chamber.  A Kuglen hook was used to prolapse the IOL into the anterior chamber and was then reinserted in the capsular bag was was well positioned and centered.  Provisc was then removed with the I and A tip.  The wound was then hydrated.  Patient sent to the Recovery Room in good condition with follow up in my office.  Preoperative Diagnosis: Dislocated IOL OS Postoperative Diagnosis:  Same Procedure name: Reposition IOL OS

## 2016-09-01 NOTE — Discharge Instructions (Signed)
°  °          [facility] Instructions °[address]- [place] [address]-[place] °    ° °1. Avoid closing eyes tightly. One often closes the eye tightly when laughing, talking, sneezing, coughing or if they feel irritated. At these times, you should be careful not to close your eyes tightly. ° °2. Instill eye drops as instructed. To instill drops in your eye, open it, look up and have someone gently pull the lower lid down and instill a couple of drops inside the lower lid. ° °3. Do not touch upper lid. ° °4. Take Advil or Tylenol for pain. ° °5. You may use either eye for near work, such as reading or sewing and you may watch television. ° °6. You may have your hair done at the beauty parlor at any time. ° °7. Wear dark glasses with or without your own glasses if you are in bright light. ° °8. Call our office at [phone] or [phone] if you have sharp pain in your eye or unusual symptoms. ° °9.  FOLLOW UP WITH DR. SHAPIRO TODAY IN HIS [place] OFFICE AT 2:45pm. ° °  °I have received a copy of the above instructions and will follow them.  ° ° ° °IF YOU ARE IN IMMEDIATE DANGER CALL 911! ° °It is important for you to keep your follow-up appointment with your physician after discharge, OR, for you /your caregiver to make a follow-up appointment with your physician / medical provider after discharge. ° °Show these instructions to the next healthcare provider you see. ° °

## 2016-09-02 ENCOUNTER — Encounter (HOSPITAL_COMMUNITY): Payer: Self-pay | Admitting: Ophthalmology

## 2016-09-30 ENCOUNTER — Encounter (HOSPITAL_COMMUNITY)
Admission: RE | Admit: 2016-09-30 | Discharge: 2016-09-30 | Disposition: A | Discharge status: Home / Self Care | Payer: Medicare Other | Source: Ambulatory Visit | Attending: Ophthalmology | Admitting: Ophthalmology

## 2016-09-30 ENCOUNTER — Encounter (HOSPITAL_COMMUNITY): Payer: Self-pay

## 2016-10-06 ENCOUNTER — Ambulatory Visit (HOSPITAL_COMMUNITY): Payer: Medicare Other | Admitting: Anesthesiology

## 2016-10-06 ENCOUNTER — Encounter (HOSPITAL_COMMUNITY)
Admission: RE | Disposition: A | Discharge status: Home / Self Care | Payer: Self-pay | Source: Ambulatory Visit | Attending: Ophthalmology

## 2016-10-06 ENCOUNTER — Ambulatory Visit (HOSPITAL_COMMUNITY)
Admission: RE | Admit: 2016-10-06 | Discharge: 2016-10-06 | Disposition: A | Discharge status: Home / Self Care | Payer: Medicare Other | Source: Ambulatory Visit | Attending: Ophthalmology | Admitting: Ophthalmology

## 2016-10-06 DIAGNOSIS — E1136 Type 2 diabetes mellitus with diabetic cataract: Secondary | ICD-10-CM | POA: Diagnosis not present

## 2016-10-06 DIAGNOSIS — I10 Essential (primary) hypertension: Secondary | ICD-10-CM | POA: Diagnosis not present

## 2016-10-06 DIAGNOSIS — Z87891 Personal history of nicotine dependence: Secondary | ICD-10-CM | POA: Diagnosis not present

## 2016-10-06 DIAGNOSIS — Z7984 Long term (current) use of oral hypoglycemic drugs: Secondary | ICD-10-CM | POA: Diagnosis not present

## 2016-10-06 DIAGNOSIS — H269 Unspecified cataract: Secondary | ICD-10-CM | POA: Insufficient documentation

## 2016-10-06 DIAGNOSIS — Z79899 Other long term (current) drug therapy: Secondary | ICD-10-CM | POA: Diagnosis not present

## 2016-10-06 HISTORY — PX: CATARACT EXTRACTION W/PHACO: SHX586

## 2016-10-06 LAB — GLUCOSE, CAPILLARY: Glucose-Capillary: 145 mg/dL — ABNORMAL HIGH (ref 65–99)

## 2016-10-06 SURGERY — PHACOEMULSIFICATION, CATARACT, WITH IOL INSERTION
Anesthesia: Monitor Anesthesia Care | Site: Eye | Laterality: Right

## 2016-10-06 MED ORDER — PROVISC 10 MG/ML IO SOLN
INTRAOCULAR | Status: DC | PRN
  Administered 2016-10-06: 0.85 mL via INTRAOCULAR

## 2016-10-06 MED ORDER — KETOROLAC TROMETHAMINE 0.5 % OP SOLN
1.0000 [drp] | OPHTHALMIC | Status: AC
  Administered 2016-10-06 (×3): 1 [drp] via OPHTHALMIC

## 2016-10-06 MED ORDER — TETRACAINE 0.5 % OP SOLN OPTIME - NO CHARGE
OPHTHALMIC | Status: DC | PRN
  Administered 2016-10-06: 2 [drp] via OPHTHALMIC

## 2016-10-06 MED ORDER — TETRACAINE HCL 0.5 % OP SOLN
1.0000 [drp] | OPHTHALMIC | Status: AC
  Administered 2016-10-06 (×3): 1 [drp] via OPHTHALMIC

## 2016-10-06 MED ORDER — LACTATED RINGERS IV SOLN
INTRAVENOUS | Status: DC
  Administered 2016-10-06: 07:00:00 via INTRAVENOUS

## 2016-10-06 MED ORDER — FENTANYL CITRATE (PF) 100 MCG/2ML IJ SOLN
25.0000 ug | Freq: Once | INTRAMUSCULAR | Status: AC
  Administered 2016-10-06: 25 ug via INTRAVENOUS

## 2016-10-06 MED ORDER — CYCLOPENTOLATE-PHENYLEPHRINE 0.2-1 % OP SOLN
1.0000 [drp] | OPHTHALMIC | Status: AC
  Administered 2016-10-06 (×3): 1 [drp] via OPHTHALMIC

## 2016-10-06 MED ORDER — MIDAZOLAM HCL 2 MG/2ML IJ SOLN
INTRAMUSCULAR | Status: AC
Start: 2016-10-06 — End: ?
  Filled 2016-10-06: qty 2

## 2016-10-06 MED ORDER — MIDAZOLAM HCL 2 MG/2ML IJ SOLN
1.0000 mg | INTRAMUSCULAR | Status: AC
  Administered 2016-10-06: 2 mg via INTRAVENOUS

## 2016-10-06 MED ORDER — FENTANYL CITRATE (PF) 100 MCG/2ML IJ SOLN
INTRAMUSCULAR | Status: AC
  Filled 2016-10-06: qty 2

## 2016-10-06 MED ORDER — EPINEPHRINE PF 1 MG/ML IJ SOLN
INTRAOCULAR | Status: DC | PRN
  Administered 2016-10-06: 500 mL

## 2016-10-06 MED ORDER — BSS IO SOLN
INTRAOCULAR | Status: DC | PRN
  Administered 2016-10-06: 15 mL

## 2016-10-06 MED ORDER — PHENYLEPHRINE HCL 2.5 % OP SOLN
1.0000 [drp] | OPHTHALMIC | Status: AC
  Administered 2016-10-06 (×3): 1 [drp] via OPHTHALMIC

## 2016-10-06 SURGICAL SUPPLY — 9 items
CLOTH BEACON ORANGE TIMEOUT ST (SAFETY) ×2 IMPLANT
EYE SHIELD UNIVERSAL CLEAR (GAUZE/BANDAGES/DRESSINGS) ×2 IMPLANT
GLOVE BIOGEL PI IND STRL 7.0 (GLOVE) IMPLANT
GLOVE BIOGEL PI INDICATOR 7.0 (GLOVE) ×4
LENS ALC ACRYL/TECN (Ophthalmic Related) ×3 IMPLANT
PAD ARMBOARD 7.5X6 YLW CONV (MISCELLANEOUS) ×2 IMPLANT
TAPE SURG TRANSPORE 1 IN (GAUZE/BANDAGES/DRESSINGS) IMPLANT
TAPE SURGICAL TRANSPORE 1 IN (GAUZE/BANDAGES/DRESSINGS) ×2
WATER STERILE IRR 250ML POUR (IV SOLUTION) ×2 IMPLANT

## 2016-10-06 NOTE — H&P (Signed)
The patient was re examined and there is no change in the patients condition since the original H and P. 

## 2016-10-06 NOTE — Op Note (Signed)
Patient brought to the operating room and prepped and draped in the usual manner.  Lid speculum inserted in right eye.  Stab incision made at the twelve o'clock position.  Provisc instilled in the anterior chamber.   A 2.4 mm. Stab incision was made temporally.  An anterior capsulotomy was done with a bent 25 gauge needle.  The nucleus was hydrodissected.  The Phaco tip was inserted in the anterior chamber and the nucleus was emulsified.  CDE was 6.21.  The cortical material was then removed with the I and A tip.  Posterior capsule was the polished.  The anterior chamber was deepened with Provisc.  A 18.0 Diopter Alcon [id number] IOL was then inserted in the capsular bag.  Provisc was then removed with the I and A tip.  The wound was then hydrated.  Patient sent to the Recovery Room in good condition with follow up in my office.  Preoperative Diagnosis:  Nuclear Cataract OD Postoperative Diagnosis:  Same Procedure name: Kelman Phacoemulsification OD with IOL

## 2016-10-06 NOTE — Anesthesia Preprocedure Evaluation (Signed)
Anesthesia Evaluation  Patient identified by MRN, date of birth, ID band Patient awake    Reviewed: Allergy & Precautions, NPO status , Patient's Chart, lab work & pertinent test results  Airway Mallampati: II  TM Distance: >3 FB     Dental  (+) Teeth Intact   Pulmonary asthma , former smoker,    breath sounds clear to auscultation       Cardiovascular hypertension, Pt. on medications  Rhythm:Regular Rate:Normal     Neuro/Psych PSYCHIATRIC DISORDERS Anxiety    GI/Hepatic negative GI ROS,   Endo/Other  diabetes, Type 2  Renal/GU      Musculoskeletal   Abdominal   Peds  Hematology   Anesthesia Other Findings   Reproductive/Obstetrics                             Anesthesia Physical Anesthesia Plan  ASA: III  Anesthesia Plan: MAC   Post-op Pain Management:    Induction: Intravenous  PONV Risk Score and Plan:   Airway Management Planned: Nasal Cannula  Additional Equipment:   Intra-op Plan:   Post-operative Plan:   Informed Consent: I have reviewed the patients History and Physical, chart, labs and discussed the procedure including the risks, benefits and alternatives for the proposed anesthesia with the patient or authorized representative who has indicated his/her understanding and acceptance.     Plan Discussed with:   Anesthesia Plan Comments:         Anesthesia Quick Evaluation

## 2016-10-06 NOTE — Anesthesia Postprocedure Evaluation (Signed)
Anesthesia Post Note  Patient: Jeanne Medina  Procedure(s) Performed: Procedure(s) (LRB): CATARACT EXTRACTION PHACO AND INTRAOCULAR LENS PLACEMENT (IOC) (Right)  Patient location during evaluation: Short Stay Anesthesia Type: MAC Level of consciousness: awake and alert Pain management: satisfactory to patient Vital Signs Assessment: post-procedure vital signs reviewed and stable Respiratory status: spontaneous breathing Cardiovascular status: stable Postop Assessment: no signs of nausea or vomiting Anesthetic complications: no     Last Vitals:  Vitals:   10/06/16 0805 10/06/16 0810  BP: (!) 119/58 111/62  Pulse:    Resp: 17 15  Temp:      Last Pain:  Vitals:   10/06/16 0733  TempSrc: Oral                 Yaakov Saindon

## 2016-10-06 NOTE — Transfer of Care (Signed)
Immediate Anesthesia Transfer of Care Note  Patient: Jeanne Medina  Procedure(s) Performed: Procedure(s) (LRB): CATARACT EXTRACTION PHACO AND INTRAOCULAR LENS PLACEMENT (IOC) (Right)  Patient Location: Shortstay  Anesthesia Type: MAC  Level of Consciousness: awake  Airway & Oxygen Therapy: Patient Spontanous Breathing   Post-op Assessment: Report given to PACU RN, Post -op Vital signs reviewed and stable and Patient moving all extremities  Post vital signs: Reviewed and stable  Complications: No apparent anesthesia complications

## 2016-10-06 NOTE — Anesthesia Procedure Notes (Signed)
Procedure Name: MAC Date/Time: 10/06/2016 8:14 AM Performed by: Vista Deck Pre-anesthesia Checklist: Patient identified, Emergency Drugs available, Suction available, Timeout performed and Patient being monitored Patient Re-evaluated:Patient Re-evaluated prior to inductionOxygen Delivery Method: Nasal Cannula

## 2016-10-06 NOTE — Discharge Instructions (Signed)
°  °          [facility] Instructions °[address]- [place] [address]-[place] °    ° °1. Avoid closing eyes tightly. One often closes the eye tightly when laughing, talking, sneezing, coughing or if they feel irritated. At these times, you should be careful not to close your eyes tightly. ° °2. Instill eye drops as instructed. To instill drops in your eye, open it, look up and have someone gently pull the lower lid down and instill a couple of drops inside the lower lid. ° °3. Do not touch upper lid. ° °4. Take Advil or Tylenol for pain. ° °5. You may use either eye for near work, such as reading or sewing and you may watch television. ° °6. You may have your hair done at the beauty parlor at any time. ° °7. Wear dark glasses with or without your own glasses if you are in bright light. ° °8. Call our office at [phone] or [phone] if you have sharp pain in your eye or unusual symptoms. ° °9.  FOLLOW UP WITH DR. SHAPIRO TODAY IN HIS [place] OFFICE AT 2:45pm. ° °  °I have received a copy of the above instructions and will follow them.  ° ° ° °IF YOU ARE IN IMMEDIATE DANGER CALL 911! ° °It is important for you to keep your follow-up appointment with your physician after discharge, OR, for you /your caregiver to make a follow-up appointment with your physician / medical provider after discharge. ° °Show these instructions to the next healthcare provider you see. °PATIENT INSTRUCTIONS °POST-ANESTHESIA ° °IMMEDIATELY FOLLOWING SURGERY:  Do not drive or operate machinery for the first twenty four hours after surgery.  Do not make any important decisions for twenty four hours after surgery or while taking narcotic pain medications or sedatives.  If you develop intractable nausea and vomiting or a severe headache please notify your doctor immediately. ° °FOLLOW-UP:  Please make an appointment with your surgeon as instructed. You do not need to follow up with anesthesia unless  specifically instructed to do so. ° °WOUND CARE INSTRUCTIONS (if applicable):  Keep a dry clean dressing on the anesthesia/puncture wound site if there is drainage.  Once the wound has quit draining you may leave it open to air.  Generally you should leave the bandage intact for twenty four hours unless there is drainage.  If the epidural site drains for more than 36-48 hours please call the anesthesia department. ° °QUESTIONS?:  Please feel free to call your physician or the hospital operator if you have any questions, and they will be happy to assist you.    ° ° ° °

## 2016-10-07 ENCOUNTER — Encounter (HOSPITAL_COMMUNITY): Payer: Self-pay | Admitting: Ophthalmology

## 2017-12-23 LAB — HM HEPATITIS C SCREENING LAB: HM Hepatitis Screen: NEGATIVE

## 2018-04-29 DIAGNOSIS — H40033 Anatomical narrow angle, bilateral: Secondary | ICD-10-CM | POA: Diagnosis not present

## 2018-04-29 DIAGNOSIS — E119 Type 2 diabetes mellitus without complications: Secondary | ICD-10-CM | POA: Diagnosis not present

## 2018-04-29 LAB — HM DIABETES EYE EXAM

## 2018-06-23 DIAGNOSIS — I10 Essential (primary) hypertension: Secondary | ICD-10-CM | POA: Diagnosis not present

## 2018-06-23 DIAGNOSIS — L309 Dermatitis, unspecified: Secondary | ICD-10-CM | POA: Diagnosis not present

## 2018-07-05 DIAGNOSIS — Z1231 Encounter for screening mammogram for malignant neoplasm of breast: Secondary | ICD-10-CM | POA: Diagnosis not present

## 2018-07-11 DIAGNOSIS — E119 Type 2 diabetes mellitus without complications: Secondary | ICD-10-CM | POA: Diagnosis not present

## 2018-11-01 ENCOUNTER — Other Ambulatory Visit: Payer: Self-pay

## 2018-11-02 ENCOUNTER — Encounter: Payer: Self-pay | Admitting: Family Medicine

## 2018-11-04 ENCOUNTER — Other Ambulatory Visit: Payer: Self-pay

## 2018-11-04 ENCOUNTER — Ambulatory Visit (INDEPENDENT_AMBULATORY_CARE_PROVIDER_SITE_OTHER): Payer: Medicare Other | Admitting: Family Medicine

## 2018-11-04 ENCOUNTER — Encounter: Payer: Self-pay | Admitting: Family Medicine

## 2018-11-04 VITALS — BP 142/64 | HR 93 | Temp 97.0°F | Ht 63.0 in | Wt 208.0 lb

## 2018-11-04 DIAGNOSIS — E7800 Pure hypercholesterolemia, unspecified: Secondary | ICD-10-CM

## 2018-11-04 DIAGNOSIS — I10 Essential (primary) hypertension: Secondary | ICD-10-CM | POA: Insufficient documentation

## 2018-11-04 DIAGNOSIS — E1159 Type 2 diabetes mellitus with other circulatory complications: Secondary | ICD-10-CM | POA: Insufficient documentation

## 2018-11-04 DIAGNOSIS — Z130 Encounter for screening for diseases of the blood and blood-forming organs and certain disorders involving the immune mechanism: Secondary | ICD-10-CM | POA: Diagnosis not present

## 2018-11-04 DIAGNOSIS — E119 Type 2 diabetes mellitus without complications: Secondary | ICD-10-CM | POA: Insufficient documentation

## 2018-11-04 DIAGNOSIS — Z7689 Persons encountering health services in other specified circumstances: Secondary | ICD-10-CM

## 2018-11-04 DIAGNOSIS — E785 Hyperlipidemia, unspecified: Secondary | ICD-10-CM | POA: Insufficient documentation

## 2018-11-04 DIAGNOSIS — J4520 Mild intermittent asthma, uncomplicated: Secondary | ICD-10-CM | POA: Insufficient documentation

## 2018-11-04 DIAGNOSIS — F419 Anxiety disorder, unspecified: Secondary | ICD-10-CM

## 2018-11-04 DIAGNOSIS — J45909 Unspecified asthma, uncomplicated: Secondary | ICD-10-CM | POA: Insufficient documentation

## 2018-11-04 DIAGNOSIS — M1990 Unspecified osteoarthritis, unspecified site: Secondary | ICD-10-CM | POA: Insufficient documentation

## 2018-11-04 DIAGNOSIS — G479 Sleep disorder, unspecified: Secondary | ICD-10-CM | POA: Diagnosis not present

## 2018-11-04 DIAGNOSIS — N952 Postmenopausal atrophic vaginitis: Secondary | ICD-10-CM | POA: Insufficient documentation

## 2018-11-04 LAB — BAYER DCA HB A1C WAIVED: HB A1C (BAYER DCA - WAIVED): 6.6 % (ref <7.0)

## 2018-11-04 MED ORDER — AMLODIPINE BESYLATE 10 MG PO TABS: 10.0000 mg | ORAL_TABLET | Freq: Every day | ORAL | 1 refills | Status: DC

## 2018-11-04 MED ORDER — GLIPIZIDE 10 MG PO TABS: 10.0000 mg | ORAL_TABLET | Freq: Two times a day (BID) | ORAL | 1 refills | Status: DC

## 2018-11-04 MED ORDER — METFORMIN HCL 500 MG PO TABS: 1000.0000 mg | ORAL_TABLET | Freq: Two times a day (BID) | ORAL | 1 refills | Status: DC

## 2018-11-04 MED ORDER — DAPAGLIFLOZIN PROPANEDIOL 5 MG PO TABS: 5.0000 mg | ORAL_TABLET | Freq: Every day | ORAL | 2 refills | Status: DC

## 2018-11-04 MED ORDER — CITALOPRAM HYDROBROMIDE 40 MG PO TABS: 40.0000 mg | ORAL_TABLET | Freq: Every day | ORAL | 1 refills | Status: DC

## 2018-11-04 MED ORDER — TRAZODONE HCL 50 MG PO TABS: 50.0000 mg | ORAL_TABLET | Freq: Every evening | ORAL | 2 refills | Status: DC | PRN

## 2018-11-04 MED ORDER — LOVASTATIN 40 MG PO TABS: 40.0000 mg | ORAL_TABLET | Freq: Every day | ORAL | 1 refills | Status: DC

## 2018-11-04 MED ORDER — LISINOPRIL-HYDROCHLOROTHIAZIDE 20-25 MG PO TABS: 1.0000 | ORAL_TABLET | Freq: Every day | ORAL | 1 refills | Status: DC

## 2018-11-04 NOTE — Patient Instructions (Signed)

## 2018-11-04 NOTE — Progress Notes (Signed)
New Patient Office Visit  Subjective:  Patient ID: Jeanne Medina, female    DOB: 1950-12-18  Age: 68 y.o. MRN: [id number]  CC:  Chief Complaint  Patient presents with  . New Patient (Initial Visit)    Patient states she was placed on Pioglitazone and she is now having weight pain and swelling in her right foot.  . Establish Care  . trouble sleeping    6 months     HPI DAYSIE HELF presents to establish care.   Diabetes Mellitus: Patient presents for follow up of diabetes. Current symptoms include: none. Known diabetic complications: none. Cardiovascular risk factors: advanced age (older than 54 for men, 84 for women), diabetes mellitus, hypertension and obesity (BMI >= 30 kg/m2). Current diabetic medications include oral agents (triple therapy): glipizide (generic), metformin (generic), pioglitazone (Actos). She reports since starting on the Actos she has developed right ankle/foot swelling and has gained weight. Eye exam current (within one year): Yes - record requested. Weight trend: increasing steadily. Current monitoring regimen: home blood tests - daily. Home blood sugar records: fasting range: low 100s. Any episodes of hypoglycemia? No. Is She on ACE inhibitor or angiotensin II receptor blocker? Yes,  lisinopril (generic). Last A1c 7.2 in January 2020; A1c today 6.6.   Hypertension: patient reports BP is < 140/90 when she takes it at home but is always elevated when she goes to the doctor.  Difficulty Sleeping: Patient reports for the past 6 months she is having a difficult time falling asleep and then she wakes up frequently throughout the night and has a hard time returning to sleep. She has tried melatonin and unisom, which were somewhat effective, but not nearly enough.   Review of Systems  Constitutional: Negative for chills, fever, malaise/fatigue and weight loss.  HENT: Negative for congestion, ear discharge, ear pain, nosebleeds, sinus pain, sore throat and tinnitus.    Eyes: Negative for blurred vision, double vision, pain, discharge and redness.  Respiratory: Negative for cough, shortness of breath and wheezing.   Cardiovascular: Positive for leg swelling. Negative for chest pain and palpitations.  Gastrointestinal: Negative for abdominal pain, constipation, diarrhea, heartburn, nausea and vomiting.  Genitourinary: Negative for dysuria, frequency and urgency.  Musculoskeletal: Negative for myalgias.  Skin: Negative for rash.  Neurological: Negative for dizziness, seizures, weakness and headaches.  Psychiatric/Behavioral: Negative for depression, substance abuse and suicidal ideas. The patient has insomnia. The patient is not nervous/anxious.     Current Outpatient Medications:  .  amLODipine (NORVASC) 10 MG tablet, Take 1 tablet (10 mg total) by mouth daily., Disp: 90 tablet, Rfl: 1 .  citalopram (CELEXA) 40 MG tablet, Take 1 tablet (40 mg total) by mouth daily., Disp: 90 tablet, Rfl: 1 .  diphenhydrAMINE (ALLERGY RELIEF) 25 MG tablet, Take 25 mg by mouth at bedtime as needed (for allergies.)., Disp: , Rfl:  .  glipiZIDE (GLUCOTROL) 10 MG tablet, Take 1 tablet (10 mg total) by mouth 2 (two) times a day., Disp: 180 tablet, Rfl: 1 .  ibuprofen (ADVIL,MOTRIN) 200 MG tablet, Take 200 mg by mouth every 8 (eight) hours as needed (for pain/headaches.)., Disp: , Rfl:  .  lisinopril-hydrochlorothiazide (ZESTORETIC) 20-25 MG tablet, Take 1 tablet by mouth daily., Disp: 90 tablet, Rfl: 1 .  lovastatin (MEVACOR) 40 MG tablet, Take 1 tablet (40 mg total) by mouth at bedtime., Disp: 90 tablet, Rfl: 1 .  metFORMIN (GLUCOPHAGE) 500 MG tablet, Take 2 tablets (1,000 mg total) by mouth 2 (two) times daily., Disp:  180 tablet, Rfl: 1 .  dapagliflozin propanediol (FARXIGA) 5 MG TABS tablet, Take 5 mg by mouth daily., Disp: 30 tablet, Rfl: 2 .  traZODone (DESYREL) 50 MG tablet, Take 1 tablet (50 mg total) by mouth at bedtime as needed for sleep., Disp: 30 tablet, Rfl: 2   Past  Medical History:  Diagnosis Date  . Anxiety   . Arthritis    osteoarthritis  . Asthma   . Diabetes mellitus without complication ([place])   . Hypercholesteremia   . Hypertension     Past Surgical History:  Procedure Laterality Date  . CATARACT EXTRACTION W/PHACO Left 08/18/2016   Procedure: CATARACT EXTRACTION PHACO AND INTRAOCULAR LENS PLACEMENT (IOC);  Surgeon: Rutherford Guys, MD;  Location: [facility];  Service: Ophthalmology;  Laterality: Left;  CDE: 6.05  . CATARACT EXTRACTION W/PHACO Left 09/01/2016   Procedure: REPOSITIONING OF LEFT IOL LENS;  Surgeon: Rutherford Guys, MD;  Location: [facility];  Service: Ophthalmology;  Laterality: Left;  . CATARACT EXTRACTION W/PHACO Right 10/06/2016   Procedure: CATARACT EXTRACTION PHACO AND INTRAOCULAR LENS PLACEMENT (IOC);  Surgeon: Rutherford Guys, MD;  Location: [facility];  Service: Ophthalmology;  Laterality: Right;  CDE: 6.21    Family History  Problem Relation Age of Onset  . Heart failure Mother   . Hypertension Mother   . Heart disease Mother   . Stroke Father   . Heart disease Father   . Hypertension Father   . Dementia Sister   . Gallbladder disease Brother   . Pancreatic cancer Brother   . Heart attack Brother   . Rheum arthritis Sister   . Heart disease Sister   . Cancer Sister        tonsils  . Diabetes Sister   . Diabetes Sister   . Heart disease Sister   . Rheum arthritis Sister     Social History   Socioeconomic History  . Marital status: Married    Spouse name: Not on file  . Number of children: Not on file  . Years of education: Not on file  . Highest education level: Not on file  Occupational History  . Not on file  Social Needs  . Financial resource strain: Not on file  . Food insecurity    Worry: Not on file    Inability: Not on file  . Transportation needs    Medical: Not on file    Non-medical: Not on file  Tobacco Use  . Smoking status: Former Smoker    Packs/day: 0.25    Years: 20.00    Pack years: 5.00     Types: Cigarettes    Quit date: 08/13/1980    Years since quitting: 38.2  . Smokeless tobacco: Never Used  Substance and Sexual Activity  . Alcohol use: No  . Drug use: No  . Sexual activity: Yes    Birth control/protection: Post-menopausal  Lifestyle  . Physical activity    Days per week: Not on file    Minutes per session: Not on file  . Stress: Not on file  Relationships  . Social Herbalist on phone: Not on file    Gets together: Not on file    Attends religious service: Not on file    Active member of club or organization: Not on file    Attends meetings of clubs or organizations: Not on file    Relationship status: Not on file  . Intimate partner violence    Fear of current or ex partner:  Not on file    Emotionally abused: Not on file    Physically abused: Not on file    Forced sexual activity: Not on file  Other Topics Concern  . Not on file  Social History Narrative  . Not on file    Objective:   Today's Vitals: BP (!) 142/64 Comment: Manual  Pulse 93   Temp (!) 97 F (36.1 C) (Oral)   Ht '5\' 3"'  (1.6 m)   Wt 208 lb (94.3 kg)   BMI 36.85 kg/m   Physical Exam Vitals signs reviewed.  Constitutional:      General: She is not in acute distress.    Appearance: Normal appearance. She is obese. She is not ill-appearing, toxic-appearing or diaphoretic.  HENT:     Head: Normocephalic and atraumatic.     Right Ear: External ear normal.     Left Ear: External ear normal.  Eyes:     General: No scleral icterus.       Right eye: No discharge.        Left eye: No discharge.     Conjunctiva/sclera: Conjunctivae normal.  Neck:     Musculoskeletal: Normal range of motion.  Cardiovascular:     Rate and Rhythm: Normal rate and regular rhythm.     Heart sounds: Normal heart sounds. No murmur. No friction rub. No gallop.   Pulmonary:     Effort: Pulmonary effort is normal. No respiratory distress.     Breath sounds: Normal breath sounds. No stridor. No  wheezing, rhonchi or rales.  Musculoskeletal: Normal range of motion.     Right lower leg: Edema present.  Skin:    General: Skin is warm and dry.  Neurological:     General: No focal deficit present.     Mental Status: She is alert and oriented to person, place, and time. Mental status is at baseline.  Psychiatric:        Mood and Affect: Mood normal.        Behavior: Behavior normal.        Thought Content: Thought content normal.        Judgment: Judgment normal.    Diabetic Foot Exam - Simple   Simple Foot Form Diabetic Foot exam was performed with the following findings: Yes 11/04/2018  1:20 PM  Visual Inspection No deformities, no ulcerations, no other skin breakdown bilaterally: Yes Sensation Testing Intact to touch and monofilament testing bilaterally: Yes Pulse Check Posterior Tibialis and Dorsalis pulse intact bilaterally: Yes Comments     Assessment & Plan:   1. Diabetes mellitus without complication [facility]) -  Patient is doing great. A1c improved from 7.2 to 6.6 with addition of Actos. We will D/C Actos due let swelling and weight gain, and add Farxiga to keep A1c at goal.  Lab Results  Component Value Date   HGBA1C 6.6 11/04/2018   - Patient is currently taking a statin. Patient is taking an ACE-inhibitor/ARB.  - Last foot exam: 11/04/2018 - Last diabetic eye exam: this year; record requested - Urine Microalbumin/Creat Ratio: 11/04/2018 - Instruction/counseling given: discussed foot care and discussed the need for weight loss - metFORMIN (GLUCOPHAGE) 500 MG tablet; Take 2 tablets (1,000 mg total) by mouth 2 (two) times daily.  Dispense: 180 tablet; Refill: 1 - CMP14+EGFR - Lipid panel - Bayer DCA Hb A1c Waived - Microalbumin / creatinine urine ratio - glipiZIDE (GLUCOTROL) 10 MG tablet; Take 1 tablet (10 mg total) by mouth 2 (two) times a day.  Dispense: 180 tablet; Refill: 1 - dapagliflozin propanediol (FARXIGA) 5 MG TABS tablet; Take 5 mg by mouth daily.   Dispense: 30 tablet; Refill: 2  2. Difficulty sleeping - Since patient has already tried OTC medications we will initiate Trazodone. Education regarding insomnia provided. Discussed good sleep habits.  - traZODone (DESYREL) 50 MG tablet; Take 1 tablet (50 mg total) by mouth at bedtime as needed for sleep.  Dispense: 30 tablet; Refill: 2  3. Essential hypertension - Well controlled on current regimen.  - amLODipine (NORVASC) 10 MG tablet; Take 1 tablet (10 mg total) by mouth daily.  Dispense: 90 tablet; Refill: 1 - CMP14+EGFR - Lipid panel - lisinopril-hydrochlorothiazide (ZESTORETIC) 20-25 MG tablet; Take 1 tablet by mouth daily.  Dispense: 90 tablet; Refill: 1  4. Hypercholesteremia - Controlled on current regimen.  - lovastatin (MEVACOR) 40 MG tablet; Take 1 tablet (40 mg total) by mouth at bedtime.  Dispense: 90 tablet; Refill: 1 - CMP14+EGFR - Lipid panel  5. Mild intermittent asthma without complication - Well controlled.  6. Anxiety - Well controlled on current regimen.  - citalopram (CELEXA) 40 MG tablet; Take 1 tablet (40 mg total) by mouth daily.  Dispense: 90 tablet; Refill: 1  7. Arthritis - Doing well with NSAIDs - CMP14+EGFR  8. Screening for deficiency anemia - CBC with Differential/Platelet  9. Encounter to establish care - My previous patient at [facility] that is following me here.    Follow-up: Return in about 6 months (around 05/07/2019) for annual physical.   Loman Brooklyn, FNP

## 2018-11-05 LAB — CMP14+EGFR
ALT: 11 IU/L (ref 0–32)
AST: 11 IU/L (ref 0–40)
Albumin/Globulin Ratio: 1.6 (ref 1.2–2.2)
Albumin: 4.5 g/dL (ref 3.8–4.8)
Alkaline Phosphatase: 86 IU/L (ref 39–117)
BUN/Creatinine Ratio: 9 — ABNORMAL LOW (ref 12–28)
BUN: 8 mg/dL (ref 8–27)
Bilirubin Total: 0.5 mg/dL (ref 0.0–1.2)
CO2: 23 mmol/L (ref 20–29)
Calcium: 10 mg/dL (ref 8.7–10.3)
Chloride: 95 mmol/L — ABNORMAL LOW (ref 96–106)
Creatinine, Ser: 0.9 mg/dL (ref 0.57–1.00)
GFR calc Af Amer: 76 mL/min/{1.73_m2} (ref >59)
GFR calc non Af Amer: 66 mL/min/{1.73_m2} (ref >59)
Globulin, Total: 2.8 g/dL (ref 1.5–4.5)
Glucose: 134 mg/dL — ABNORMAL HIGH (ref 65–99)
Potassium: 4.4 mmol/L (ref 3.5–5.2)
Sodium: 139 mmol/L (ref 134–144)
Total Protein: 7.3 g/dL (ref 6.0–8.5)

## 2018-11-05 LAB — CBC WITH DIFFERENTIAL/PLATELET
Basophils Absolute: 0.1 10*3/uL (ref 0.0–0.2)
Basos: 1 %
EOS (ABSOLUTE): 0.2 10*3/uL (ref 0.0–0.4)
Eos: 3 %
Hematocrit: 36.8 % (ref 34.0–46.6)
Hemoglobin: 11.9 g/dL (ref 11.1–15.9)
Immature Grans (Abs): 0 10*3/uL (ref 0.0–0.1)
Immature Granulocytes: 0 %
Lymphocytes Absolute: 1.3 10*3/uL (ref 0.7–3.1)
Lymphs: 14 %
MCH: 27.5 pg (ref 26.6–33.0)
MCHC: 32.3 g/dL (ref 31.5–35.7)
MCV: 85 fL (ref 79–97)
Monocytes Absolute: 0.7 10*3/uL (ref 0.1–0.9)
Monocytes: 8 %
Neutrophils Absolute: 6.9 10*3/uL (ref 1.4–7.0)
Neutrophils: 74 %
Platelets: 521 10*3/uL — ABNORMAL HIGH (ref 150–450)
RBC: 4.33 x10E6/uL (ref 3.77–5.28)
RDW: 12.5 % (ref 11.7–15.4)
WBC: 9.2 10*3/uL (ref 3.4–10.8)

## 2018-11-05 LAB — LIPID PANEL
Chol/HDL Ratio: 2.4 ratio (ref 0.0–4.4)
Cholesterol, Total: 158 mg/dL (ref 100–199)
HDL: 66 mg/dL (ref >39)
LDL Calculated: 71 mg/dL (ref 0–99)
Triglycerides: 105 mg/dL (ref 0–149)
VLDL Cholesterol Cal: 21 mg/dL (ref 5–40)

## 2018-11-05 LAB — MICROALBUMIN / CREATININE URINE RATIO
Creatinine, Urine: 35.9 mg/dL
Microalb/Creat Ratio: 111 mg/g creat — ABNORMAL HIGH (ref 0–29)
Microalbumin, Urine: 40 ug/mL

## 2018-11-08 ENCOUNTER — Telehealth: Payer: Self-pay | Admitting: Family Medicine

## 2018-11-08 DIAGNOSIS — E119 Type 2 diabetes mellitus without complications: Secondary | ICD-10-CM

## 2018-11-08 MED ORDER — DAPAGLIFLOZIN PROPANEDIOL 5 MG PO TABS: 5.0000 mg | ORAL_TABLET | Freq: Every day | ORAL | 2 refills | Status: DC

## 2018-11-08 NOTE — Telephone Encounter (Signed)
Jeanne Medina resent

## 2018-11-09 ENCOUNTER — Telehealth: Payer: Self-pay | Admitting: Family Medicine

## 2018-11-09 NOTE — Telephone Encounter (Signed)
Jeanne Medina

## 2018-11-14 ENCOUNTER — Telehealth: Payer: Self-pay | Admitting: Family Medicine

## 2018-11-21 ENCOUNTER — Telehealth: Payer: Self-pay | Admitting: Family Medicine

## 2018-11-21 DIAGNOSIS — E119 Type 2 diabetes mellitus without complications: Secondary | ICD-10-CM

## 2018-11-21 NOTE — Telephone Encounter (Signed)
I have not received a Prior Auth request for this pt. I see where [facility] documented that PA was sent earlier this month but I don't have anything. Did you have a fax or something?

## 2018-11-21 NOTE — Telephone Encounter (Signed)
Pt aware that [place] is not here today. She will address meds on return tomorrow. Prior Auth sent to [employer]

## 2018-11-22 MED ORDER — METFORMIN HCL 500 MG PO TABS: 1000.0000 mg | ORAL_TABLET | Freq: Two times a day (BID) | ORAL | 1 refills | Status: DC

## 2018-11-22 NOTE — Telephone Encounter (Signed)
Left message with all details.  Call for further questions.

## 2018-11-22 NOTE — Telephone Encounter (Signed)
Metformin sent with 360 tablets and 1 refill.  Yes, she can increase Trazodone to 100 mg by taking two tablets.  I have not received anything in my in-basket for the [place].

## 2018-11-24 NOTE — Telephone Encounter (Signed)
Prior Auth for Farxiga 5mg - In Process  ([facility]) Rx #: Lauraine Rinne   Your information has been submitted to [employer]. [employer] [place] will review the request and notify you of the determination decision directly, typically within 3 business days of your submission and once all necessary information is received.  You will also receive your request decision electronically. To check for an update later, open the request again from your dashboard.  If [address] [place] has not responded within the specified timeframe or if you have any questions about your PA submission, contact Lilly Vinton directly at [facility]) [phone] or ([place]) [phone].

## 2018-11-24 NOTE — Telephone Encounter (Signed)
Sure

## 2018-11-24 NOTE — Telephone Encounter (Signed)
Prior Auth for Farxiga 5mg -DENIED  Do you want me to start the appeal process?

## 2018-11-25 NOTE — Telephone Encounter (Signed)
Pt must try and fail Jardiance. If you would like to appeal this you can call [phone].

## 2018-11-28 MED ORDER — JARDIANCE 10 MG PO TABS: 10.0000 mg | ORAL_TABLET | Freq: Every day | ORAL | 2 refills | Status: DC

## 2018-11-28 NOTE — Telephone Encounter (Signed)
Pt aware and voiced understanding 

## 2018-11-28 NOTE — Telephone Encounter (Signed)
Jeanne Medina would like you to call her regarding an issue with the cost of recent prescription. She needs to have an alternative medicine. Thank you!

## 2018-11-29 ENCOUNTER — Encounter: Payer: Self-pay | Admitting: Family Medicine

## 2018-11-29 NOTE — Telephone Encounter (Signed)
Called and got her voicemail so I sent her a MyChart message.

## 2018-12-02 ENCOUNTER — Other Ambulatory Visit: Payer: Self-pay | Admitting: Family Medicine

## 2018-12-02 DIAGNOSIS — Z789 Other specified health status: Secondary | ICD-10-CM

## 2018-12-08 ENCOUNTER — Telehealth: Payer: Medicare Other

## 2018-12-23 ENCOUNTER — Encounter: Payer: Self-pay | Admitting: Physician Assistant

## 2018-12-23 ENCOUNTER — Ambulatory Visit (INDEPENDENT_AMBULATORY_CARE_PROVIDER_SITE_OTHER): Payer: Medicare Other | Admitting: Physician Assistant

## 2018-12-23 DIAGNOSIS — J0110 Acute frontal sinusitis, unspecified: Secondary | ICD-10-CM

## 2018-12-23 MED ORDER — FLUTICASONE PROPIONATE 50 MCG/ACT NA SUSP: 1.0000 | Freq: Two times a day (BID) | NASAL | 5 refills | Status: DC

## 2018-12-23 MED ORDER — FLUCONAZOLE 150 MG PO TABS: 150.0000 mg | ORAL_TABLET | Freq: Once | ORAL | 0 refills | Status: AC

## 2018-12-23 MED ORDER — AMOXICILLIN 500 MG PO CAPS: 500.0000 mg | ORAL_CAPSULE | Freq: Three times a day (TID) | ORAL | 0 refills | Status: DC

## 2018-12-23 MED ORDER — LORATADINE 10 MG PO TABS: 10.0000 mg | ORAL_TABLET | Freq: Every day | ORAL | 11 refills | Status: DC

## 2018-12-25 NOTE — Progress Notes (Signed)
Telephone visit  Subjective: ZO:XWRUEAVWUCC:sinusitis PCP: Gwenlyn FudgeJoyce, Britney F, FNP JWJ:XBJYNWHPI:Lenee Karolee OhsK Moccio is a 68 y.o. female calls for telephone consult today. Patient provides verbal consent for consult held via phone.  Patient is identified with 2 separate identifiers.  At this time the entire area is on COVID-19 social distancing and stay home orders are in place.  Patient is of higher risk and therefore we are performing this by a virtual method.  Location of patient: home Location of provider: [facility] Others present for call: no  This patient has had increased sinus congestion in the sinus passages and upper airway.  She has been using Afrin for about 7 days.  We have had a conversation about the rebound congestion that Afrin can cause.  She does have known allergies.  She has been seeing some blood with the mucus.  And she has had a significant sinus headache.   ROS: Per HPI  Allergies  Allergen Reactions  . Sulfa Antibiotics Hives and Itching    welts   Past Medical History:  Diagnosis Date  . Anxiety   . Arthritis    osteoarthritis  . Asthma   . Diabetes mellitus without complication (HCC)   . Hypercholesteremia   . Hypertension     Current Outpatient Medications:  .  amLODipine (NORVASC) 10 MG tablet, Take 1 tablet (10 mg total) by mouth daily., Disp: 90 tablet, Rfl: 1 .  amoxicillin (AMOXIL) 500 MG capsule, Take 1 capsule (500 mg total) by mouth 3 (three) times daily., Disp: 30 capsule, Rfl: 0 .  citalopram (CELEXA) 40 MG tablet, Take 1 tablet (40 mg total) by mouth daily., Disp: 90 tablet, Rfl: 1 .  diphenhydrAMINE (ALLERGY RELIEF) 25 MG tablet, Take 25 mg by mouth at bedtime as needed (for allergies.)., Disp: , Rfl:  .  empagliflozin (JARDIANCE) 10 MG TABS tablet, Take 10 mg by mouth daily., Disp: 30 tablet, Rfl: 2 .  fluticasone (FLONASE) 50 MCG/ACT nasal spray, Place 1 spray into both nostrils 2 (two) times daily., Disp: 16 g, Rfl: 5 .  glipiZIDE (GLUCOTROL) 10 MG tablet,  Take 1 tablet (10 mg total) by mouth 2 (two) times a day., Disp: 180 tablet, Rfl: 1 .  ibuprofen (ADVIL,MOTRIN) 200 MG tablet, Take 200 mg by mouth every 8 (eight) hours as needed (for pain/headaches.)., Disp: , Rfl:  .  lisinopril-hydrochlorothiazide (ZESTORETIC) 20-25 MG tablet, Take 1 tablet by mouth daily., Disp: 90 tablet, Rfl: 1 .  loratadine (CLARITIN) 10 MG tablet, Take 1 tablet (10 mg total) by mouth daily., Disp: 30 tablet, Rfl: 11 .  lovastatin (MEVACOR) 40 MG tablet, Take 1 tablet (40 mg total) by mouth at bedtime., Disp: 90 tablet, Rfl: 1 .  metFORMIN (GLUCOPHAGE) 500 MG tablet, Take 2 tablets (1,000 mg total) by mouth 2 (two) times daily., Disp: 360 tablet, Rfl: 1 .  traZODone (DESYREL) 50 MG tablet, Take 1 tablet (50 mg total) by mouth at bedtime as needed for sleep., Disp: 30 tablet, Rfl: 2  Assessment/ Plan: 68 y.o. female   1. Acute non-recurrent frontal sinusitis - loratadine (CLARITIN) 10 MG tablet; Take 1 tablet (10 mg total) by mouth daily.  Dispense: 30 tablet; Refill: 11 - fluticasone (FLONASE) 50 MCG/ACT nasal spray; Place 1 spray into both nostrils 2 (two) times daily.  Dispense: 16 g; Refill: 5 - amoxicillin (AMOXIL) 500 MG capsule; Take 1 capsule (500 mg total) by mouth 3 (three) times daily.  Dispense: 30 capsule; Refill: 0 - fluconazole (DIFLUCAN) 150  MG tablet; Take 1 tablet (150 mg total) by mouth once for 1 dose.  Dispense: 1 tablet; Refill: 0   No follow-ups on file.  Continue all other maintenance medications as listed above.  Start time: 11:13 AM End time: 11:23 AM  Meds ordered this encounter  Medications  . loratadine (CLARITIN) 10 MG tablet    Sig: Take 1 tablet (10 mg total) by mouth daily.    Dispense:  30 tablet    Refill:  11    Order Specific Question:   Supervising Provider    Answer:   Janora Norlander [[id number]]  . fluticasone (FLONASE) 50 MCG/ACT nasal spray    Sig: Place 1 spray into both nostrils 2 (two) times daily.    Dispense:   16 g    Refill:  5    Order Specific Question:   Supervising Provider    Answer:   Janora Norlander [[id number]]  . amoxicillin (AMOXIL) 500 MG capsule    Sig: Take 1 capsule (500 mg total) by mouth 3 (three) times daily.    Dispense:  30 capsule    Refill:  0    Order Specific Question:   Supervising Provider    Answer:   Janora Norlander [[id number]]  . fluconazole (DIFLUCAN) 150 MG tablet    Sig: Take 1 tablet (150 mg total) by mouth once for 1 dose.    Dispense:  1 tablet    Refill:  0    Order Specific Question:   Supervising Provider    Answer:   Janora Norlander [[id number]]    Particia Nearing PA-C [place] [phone]

## 2019-01-17 ENCOUNTER — Other Ambulatory Visit: Payer: Self-pay | Admitting: Family Medicine

## 2019-01-17 DIAGNOSIS — G479 Sleep disorder, unspecified: Secondary | ICD-10-CM

## 2019-04-10 DIAGNOSIS — M9901 Segmental and somatic dysfunction of cervical region: Secondary | ICD-10-CM | POA: Diagnosis not present

## 2019-04-10 DIAGNOSIS — S233XXA Sprain of ligaments of thoracic spine, initial encounter: Secondary | ICD-10-CM | POA: Diagnosis not present

## 2019-04-10 DIAGNOSIS — M47812 Spondylosis without myelopathy or radiculopathy, cervical region: Secondary | ICD-10-CM | POA: Diagnosis not present

## 2019-04-11 DIAGNOSIS — S233XXA Sprain of ligaments of thoracic spine, initial encounter: Secondary | ICD-10-CM | POA: Diagnosis not present

## 2019-04-11 DIAGNOSIS — M47812 Spondylosis without myelopathy or radiculopathy, cervical region: Secondary | ICD-10-CM | POA: Diagnosis not present

## 2019-04-11 DIAGNOSIS — M9901 Segmental and somatic dysfunction of cervical region: Secondary | ICD-10-CM | POA: Diagnosis not present

## 2019-04-13 DIAGNOSIS — M47812 Spondylosis without myelopathy or radiculopathy, cervical region: Secondary | ICD-10-CM | POA: Diagnosis not present

## 2019-04-13 DIAGNOSIS — S233XXA Sprain of ligaments of thoracic spine, initial encounter: Secondary | ICD-10-CM | POA: Diagnosis not present

## 2019-04-13 DIAGNOSIS — M9901 Segmental and somatic dysfunction of cervical region: Secondary | ICD-10-CM | POA: Diagnosis not present

## 2019-04-14 DIAGNOSIS — M47812 Spondylosis without myelopathy or radiculopathy, cervical region: Secondary | ICD-10-CM | POA: Diagnosis not present

## 2019-04-14 DIAGNOSIS — M9901 Segmental and somatic dysfunction of cervical region: Secondary | ICD-10-CM | POA: Diagnosis not present

## 2019-04-14 DIAGNOSIS — S233XXA Sprain of ligaments of thoracic spine, initial encounter: Secondary | ICD-10-CM | POA: Diagnosis not present

## 2019-05-29 NOTE — Progress Notes (Signed)
Assessment & Plan:  1. Well adult exam - Preventive health education provided.  Patient declined colonoscopy, DEXA, and COVID-19 vaccine.  She is up-to-date with her foot exam, A1c, eye exam, mammogram, tetanus, pneumonia, hep C screening, and influenza.  We are requesting her eye exam from [facility].  2. Diabetes mellitus without complication ([place]) Lab Results  Component Value Date   HGBA1C 7.8 (H) 05/31/2019   HGBA1C 6.6 11/04/2018  - Diabetes is not at goal of A1c < 7. - Medications: continue current medications, add Januvia 25 mg QD - Patient is currently taking a statin. Patient is taking an ACE-inhibitor/ARB.  - Last foot exam: 11/04/2018 - Last diabetic eye exam: January 2021 - record is being requested from [facility] - Urine Microalbumin/Creat Ratio: 11/04/2018 - CMP14+EGFR - Bayer DCA Hb A1c Waived - metFORMIN (GLUCOPHAGE) 500 MG tablet; Take 2 tablets (1,000 mg total) by mouth 2 (two) times daily.  Dispense: 360 tablet; Refill: 1 - glipiZIDE (GLUCOTROL) 10 MG tablet; Take 1 tablet (10 mg total) by mouth 2 (two) times daily before a meal.  Dispense: 180 tablet; Refill: 3 - sitaGLIPtin (JANUVIA) 25 MG tablet; Take 1 tablet (25 mg total) by mouth daily.  Dispense: 90 tablet; Refill: 1  3. Anxiety - Well controlled on current regimen.  - citalopram (CELEXA) 40 MG tablet; Take 1 tablet (40 mg total) by mouth daily.  Dispense: 90 tablet; Refill: 3  4. Difficulty sleeping - Well controlled on current regimen.  - traZODone (DESYREL) 50 MG tablet; Take 1 tablet (50 mg total) by mouth at bedtime as needed. for sleep  Dispense: 90 tablet; Refill: 3  5. Essential hypertension - Well controlled on current regimen.  - amLODipine (NORVASC) 10 MG tablet; Take 1 tablet (10 mg total) by mouth daily.  Dispense: 90 tablet; Refill: 3 - lisinopril-hydrochlorothiazide (ZESTORETIC) 20-25 MG tablet; Take 1 tablet by mouth daily.  Dispense: 90 tablet; Refill: 3  6.  Hypercholesteremia - Well controlled on current regimen.  - lovastatin (MEVACOR) 40 MG tablet; Take 1 tablet (40 mg total) by mouth at bedtime.  Dispense: 90 tablet; Refill: 3  7. Seasonal allergies - Well controlled on current regimen.  - loratadine (CLARITIN) 10 MG tablet; Take 1 tablet (10 mg total) by mouth daily.  Dispense: 90 tablet; Refill: 3  8. Patient cannot afford medications - Prescription assistance form for the [place] prescription assistance program completed for Januvia.  Patient reports insurance pays for all of her other medications.   Follow-up: Return in about 4 months (around 09/28/2019) for DM.   Hendricks Limes, MSN, APRN, FNP-C [facility]  Subjective:  Patient ID: Jeanne Medina, female    DOB: 04-25-1951  Age: 69 y.o. MRN: [id number]  Patient Care Team: Loman Brooklyn, FNP as PCP - General (Family Medicine)   CC:  Chief Complaint  Patient presents with  . Medical Management of Chronic Issues    6 mth, no complaints    HPI Jeanne BRISSETTE presents for her annual physical.   Occupation: retired, Marital status: married, Substance use: none Diet: healthy, [facility] Last eye exam: January 2021 Last dental exam: 1 year ago Last colonoscopy: declined Last mammogram: 07/05/2018 DEXA: declined Hepatitis C Screening: completed (negative) Immunizations: Flu Vaccine: completed  Tdap Vaccine: UTD; due 06/12/2027  Shingrix Vaccine: patient will be getting very soon Pneumonia Vaccine: completed COVID-19 Vaccine: declined  DEPRESSION SCREENING Depression  screen [facility] 2/9 05/31/2019 11/04/2018  Decreased Interest 0 0  Down, Depressed, Hopeless 0 0  PHQ - 2 Score 0 0  Altered sleeping 0 -  Tired, decreased energy 1 -  Change in appetite 0 -  Feeling bad or failure about yourself  0 -  Trouble concentrating 0 -  Moving slowly or fidgety/restless 0 -  Suicidal thoughts 0 -    PHQ-9 Score 1 -  Difficult doing work/chores Not difficult at all -   GAD 7 : Generalized Anxiety Score 05/31/2019  Nervous, Anxious, on Edge 1  Control/stop worrying 1  Worry too much - different things 0  Trouble relaxing 1  Restless 0  Easily annoyed or irritable 0  Afraid - awful might happen 0  Total GAD 7 Score 3  Anxiety Difficulty Not difficult at all    Review of Systems  Constitutional: Negative for chills, fever, malaise/fatigue and weight loss.  HENT: Negative for congestion, ear discharge, ear pain, nosebleeds, sinus pain, sore throat and tinnitus.   Eyes: Negative for blurred vision, double vision, pain, discharge and redness.  Respiratory: Negative for cough, shortness of breath and wheezing.   Cardiovascular: Negative for chest pain, palpitations and leg swelling.  Gastrointestinal: Negative for abdominal pain, constipation, diarrhea, heartburn, nausea and vomiting.  Genitourinary: Negative for dysuria, frequency and urgency.  Musculoskeletal: Negative for myalgias.  Skin: Negative for rash.  Neurological: Negative for dizziness, seizures, weakness and headaches.  Psychiatric/Behavioral: Negative for depression, substance abuse and suicidal ideas. The patient is not nervous/anxious.     Current Outpatient Medications:  .  amLODipine (NORVASC) 10 MG tablet, Take 1 tablet (10 mg total) by mouth daily., Disp: 90 tablet, Rfl: 3 .  citalopram (CELEXA) 40 MG tablet, Take 1 tablet (40 mg total) by mouth daily., Disp: 90 tablet, Rfl: 3 .  diphenhydrAMINE (ALLERGY RELIEF) 25 MG tablet, Take 25 mg by mouth at bedtime as needed (for allergies.)., Disp: , Rfl:  .  fluticasone (FLONASE) 50 MCG/ACT nasal spray, Place 1 spray into both nostrils 2 (two) times daily., Disp: 16 g, Rfl: 5 .  glipiZIDE (GLUCOTROL) 10 MG tablet, Take 1 tablet (10 mg total) by mouth 2 (two) times daily before a meal., Disp: 180 tablet, Rfl: 3 .  ibuprofen (ADVIL,MOTRIN) 200 MG tablet, Take 200 mg by  mouth every 8 (eight) hours as needed (for pain/headaches.)., Disp: , Rfl:  .  L-Lysine 500 MG CAPS, Take by mouth., Disp: , Rfl:  .  lisinopril-hydrochlorothiazide (ZESTORETIC) 20-25 MG tablet, Take 1 tablet by mouth daily., Disp: 90 tablet, Rfl: 3 .  loratadine (CLARITIN) 10 MG tablet, Take 1 tablet (10 mg total) by mouth daily., Disp: 90 tablet, Rfl: 3 .  lovastatin (MEVACOR) 40 MG tablet, Take 1 tablet (40 mg total) by mouth at bedtime., Disp: 90 tablet, Rfl: 3 .  metFORMIN (GLUCOPHAGE) 500 MG tablet, Take 2 tablets (1,000 mg total) by mouth 2 (two) times daily., Disp: 360 tablet, Rfl: 1 .  traZODone (DESYREL) 50 MG tablet, Take 1 tablet (50 mg total) by mouth at bedtime as needed. for sleep, Disp: 90 tablet, Rfl: 3  Allergies  Allergen Reactions  . Sulfa Antibiotics Hives and Itching    welts    Past Medical History:  Diagnosis Date  . Anxiety   . Arthritis    osteoarthritis  . Asthma   . Diabetes mellitus without complication ([place])   . Hypercholesteremia   . Hypertension     Past Surgical History:  Procedure Laterality  Date  . CATARACT EXTRACTION W/PHACO Left 08/18/2016   Procedure: CATARACT EXTRACTION PHACO AND INTRAOCULAR LENS PLACEMENT (IOC);  Surgeon: Rutherford Guys, MD;  Location: [facility];  Service: Ophthalmology;  Laterality: Left;  CDE: 6.05  . CATARACT EXTRACTION W/PHACO Left 09/01/2016   Procedure: REPOSITIONING OF LEFT IOL LENS;  Surgeon: Rutherford Guys, MD;  Location: [facility];  Service: Ophthalmology;  Laterality: Left;  . CATARACT EXTRACTION W/PHACO Right 10/06/2016   Procedure: CATARACT EXTRACTION PHACO AND INTRAOCULAR LENS PLACEMENT (IOC);  Surgeon: Rutherford Guys, MD;  Location: [facility];  Service: Ophthalmology;  Laterality: Right;  CDE: 6.21    Family History  Problem Relation Age of Onset  . Heart failure Mother   . Hypertension Mother   . Heart disease Mother   . Stroke Father   . Heart disease Father   . Hypertension Father   . Dementia Sister   . Gallbladder  disease Brother   . Pancreatic cancer Brother   . Heart attack Brother   . Rheum arthritis Sister   . Heart disease Sister   . Cancer Sister        tonsils  . Diabetes Sister   . Diabetes Sister   . Heart disease Sister   . Rheum arthritis Sister     Social History   Socioeconomic History  . Marital status: Married    Spouse name: Not on file  . Number of children: Not on file  . Years of education: Not on file  . Highest education level: Not on file  Occupational History  . Not on file  Tobacco Use  . Smoking status: Former Smoker    Packs/day: 0.25    Years: 20.00    Pack years: 5.00    Types: Cigarettes    Quit date: 08/13/1980    Years since quitting: 38.8  . Smokeless tobacco: Never Used  Substance and Sexual Activity  . Alcohol use: No  . Drug use: No  . Sexual activity: Yes    Birth control/protection: Post-menopausal  Other Topics Concern  . Not on file  Social History Narrative  . Not on file   Social Determinants of Health   Financial Resource Strain:   . Difficulty of Paying Living Expenses: Not on file  Food Insecurity:   . Worried About Charity fundraiser in the Last Year: Not on file  . Ran Out of Food in the Last Year: Not on file  Transportation Needs:   . Lack of Transportation (Medical): Not on file  . Lack of Transportation (Non-Medical): Not on file  Physical Activity:   . Days of Exercise per Week: Not on file  . Minutes of Exercise per Session: Not on file  Stress:   . Feeling of Stress : Not on file  Social Connections:   . Frequency of Communication with Friends and Family: Not on file  . Frequency of Social Gatherings with Friends and Family: Not on file  . Attends Religious Services: Not on file  . Active Member of Clubs or Organizations: Not on file  . Attends Archivist Meetings: Not on file  . Marital Status: Not on file  Intimate Partner Violence:   . Fear of Current or Ex-Partner: Not on file  . Emotionally  Abused: Not on file  . Physically Abused: Not on file  . Sexually Abused: Not on file      Objective:    BP 127/70   Pulse (!) 101   Temp 99.1 F (37.3  C) (Temporal)   Ht '5\' 3"'  (1.6 m)   Wt 202 lb 12.8 oz (92 kg)   SpO2 93%   BMI 35.92 kg/m   Wt Readings from Last 3 Encounters:  05/31/19 202 lb 12.8 oz (92 kg)  11/04/18 208 lb (94.3 kg)  10/06/16 187 lb (84.8 kg)    Physical Exam Vitals reviewed.  Constitutional:      General: She is not in acute distress.    Appearance: Normal appearance. She is obese. She is not ill-appearing, toxic-appearing or diaphoretic.  HENT:     Head: Normocephalic and atraumatic.     Right Ear: Tympanic membrane, ear canal and external ear normal. There is no impacted cerumen.     Left Ear: Tympanic membrane, ear canal and external ear normal. There is no impacted cerumen.     Nose: Nose normal. No congestion or rhinorrhea.     Mouth/Throat:     Mouth: Mucous membranes are moist.     Pharynx: Oropharynx is clear. No oropharyngeal exudate or posterior oropharyngeal erythema.  Eyes:     General: No scleral icterus.       Right eye: No discharge.        Left eye: No discharge.     Conjunctiva/sclera: Conjunctivae normal.     Pupils: Pupils are equal, round, and reactive to light.  Cardiovascular:     Rate and Rhythm: Normal rate and regular rhythm.     Heart sounds: Normal heart sounds. No murmur. No friction rub. No gallop.   Pulmonary:     Effort: Pulmonary effort is normal. No respiratory distress.     Breath sounds: Normal breath sounds. No stridor. No wheezing, rhonchi or rales.  Abdominal:     General: Abdomen is flat. Bowel sounds are normal. There is no distension.     Palpations: Abdomen is soft. There is no mass.     Tenderness: There is no abdominal tenderness. There is no guarding or rebound.     Hernia: No hernia is present.  Musculoskeletal:        General: Normal range of motion.     Cervical back: Normal range of motion  and neck supple. No rigidity. No muscular tenderness.  Lymphadenopathy:     Cervical: No cervical adenopathy.  Skin:    General: Skin is warm and dry.     Capillary Refill: Capillary refill takes less than 2 seconds.  Neurological:     General: No focal deficit present.     Mental Status: She is alert and oriented to person, place, and time. Mental status is at baseline.  Psychiatric:        Mood and Affect: Mood normal.        Behavior: Behavior normal.        Thought Content: Thought content normal.        Judgment: Judgment normal.     No results found for: TSH Lab Results  Component Value Date   WBC 9.2 11/04/2018   HGB 11.9 11/04/2018   HCT 36.8 11/04/2018   MCV 85 11/04/2018   PLT 521 (H) 11/04/2018   Lab Results  Component Value Date   NA 139 05/31/2019   K 5.0 05/31/2019   CO2 23 05/31/2019   GLUCOSE 204 (H) 05/31/2019   BUN 15 05/31/2019   CREATININE 1.11 (H) 05/31/2019   BILITOT 0.4 05/31/2019   ALKPHOS 94 05/31/2019   AST 12 05/31/2019   ALT 11 05/31/2019   PROT 6.9 05/31/2019  ALBUMIN 4.6 05/31/2019   CALCIUM 10.2 05/31/2019   ANIONGAP 12 08/13/2016   Lab Results  Component Value Date   CHOL 158 11/04/2018   Lab Results  Component Value Date   HDL 66 11/04/2018   Lab Results  Component Value Date   LDLCALC 71 11/04/2018   Lab Results  Component Value Date   TRIG 105 11/04/2018   Lab Results  Component Value Date   CHOLHDL 2.4 11/04/2018   Lab Results  Component Value Date   HGBA1C 7.8 (H) 05/31/2019

## 2019-05-30 ENCOUNTER — Other Ambulatory Visit: Payer: Self-pay

## 2019-05-31 ENCOUNTER — Encounter: Payer: Self-pay | Admitting: Family Medicine

## 2019-05-31 ENCOUNTER — Ambulatory Visit (INDEPENDENT_AMBULATORY_CARE_PROVIDER_SITE_OTHER): Payer: Medicare Other | Admitting: Family Medicine

## 2019-05-31 VITALS — BP 127/70 | HR 101 | Temp 99.1°F | Ht 63.0 in | Wt 202.8 lb

## 2019-05-31 DIAGNOSIS — Z0001 Encounter for general adult medical examination with abnormal findings: Secondary | ICD-10-CM | POA: Diagnosis not present

## 2019-05-31 DIAGNOSIS — I10 Essential (primary) hypertension: Secondary | ICD-10-CM

## 2019-05-31 DIAGNOSIS — J302 Other seasonal allergic rhinitis: Secondary | ICD-10-CM

## 2019-05-31 DIAGNOSIS — F419 Anxiety disorder, unspecified: Secondary | ICD-10-CM

## 2019-05-31 DIAGNOSIS — E119 Type 2 diabetes mellitus without complications: Secondary | ICD-10-CM

## 2019-05-31 DIAGNOSIS — G479 Sleep disorder, unspecified: Secondary | ICD-10-CM

## 2019-05-31 DIAGNOSIS — E7800 Pure hypercholesterolemia, unspecified: Secondary | ICD-10-CM

## 2019-05-31 DIAGNOSIS — Z0000 Encounter for general adult medical examination without abnormal findings: Secondary | ICD-10-CM

## 2019-05-31 DIAGNOSIS — Z596 Low income: Secondary | ICD-10-CM

## 2019-05-31 LAB — BAYER DCA HB A1C WAIVED: HB A1C (BAYER DCA - WAIVED): 7.8 % — ABNORMAL HIGH (ref <7.0)

## 2019-05-31 MED ORDER — LISINOPRIL-HYDROCHLOROTHIAZIDE 20-25 MG PO TABS: 1.0000 | ORAL_TABLET | Freq: Every day | ORAL | 3 refills | Status: DC

## 2019-05-31 MED ORDER — GLIPIZIDE 10 MG PO TABS: 10.0000 mg | ORAL_TABLET | Freq: Two times a day (BID) | ORAL | 3 refills | Status: DC

## 2019-05-31 MED ORDER — LOVASTATIN 40 MG PO TABS: 40.0000 mg | ORAL_TABLET | Freq: Every day | ORAL | 3 refills | Status: DC

## 2019-05-31 MED ORDER — CITALOPRAM HYDROBROMIDE 40 MG PO TABS: 40.0000 mg | ORAL_TABLET | Freq: Every day | ORAL | 3 refills | Status: DC

## 2019-05-31 MED ORDER — LORATADINE 10 MG PO TABS: 10.0000 mg | ORAL_TABLET | Freq: Every day | ORAL | 3 refills | Status: DC

## 2019-05-31 MED ORDER — AMLODIPINE BESYLATE 10 MG PO TABS: 10.0000 mg | ORAL_TABLET | Freq: Every day | ORAL | 3 refills | Status: DC

## 2019-05-31 MED ORDER — METFORMIN HCL 500 MG PO TABS: 1000.0000 mg | ORAL_TABLET | Freq: Two times a day (BID) | ORAL | 1 refills | Status: DC

## 2019-05-31 MED ORDER — TRAZODONE HCL 50 MG PO TABS: 50.0000 mg | ORAL_TABLET | Freq: Every evening | ORAL | 3 refills | Status: DC | PRN

## 2019-05-31 NOTE — Patient Instructions (Signed)
Preventive Care 62 Years and Older, Female Preventive care refers to lifestyle choices and visits with your health care provider that can promote health and wellness. This includes:  A yearly physical exam. This is also called an annual well check.  Regular dental and eye exams.  Immunizations.  Screening for certain conditions.  Healthy lifestyle choices, such as diet and exercise. What can I expect for my preventive care visit? Physical exam Your health care provider will check:  Height and weight. These may be used to calculate body mass index (BMI), which is a measurement that tells if you are at a healthy weight.  Heart rate and blood pressure.  Your skin for abnormal spots. Counseling Your health care provider may ask you questions about:  Alcohol, tobacco, and drug use.  Emotional well-being.  Home and relationship well-being.  Sexual activity.  Eating habits.  History of falls.  Memory and ability to understand (cognition).  Work and work Statistician.  Pregnancy and menstrual history. What immunizations do I need?  Influenza (flu) vaccine  This is recommended every year. Tetanus, diphtheria, and pertussis (Tdap) vaccine  You may need a Td booster every 10 years. Varicella (chickenpox) vaccine  You may need this vaccine if you have not already been vaccinated. Zoster (shingles) vaccine  You may need this after age 70. Pneumococcal conjugate (PCV13) vaccine  One dose is recommended after age 70. Pneumococcal polysaccharide (PPSV23) vaccine  One dose is recommended after age 70. Measles, mumps, and rubella (MMR) vaccine  You may need at least one dose of MMR if you were born in 1957 or later. You may also need a second dose. Meningococcal conjugate (MenACWY) vaccine  You may need this if you have certain conditions. Hepatitis A vaccine  You may need this if you have certain conditions or if you travel or work in places where you may be exposed  to hepatitis A. Hepatitis B vaccine  You may need this if you have certain conditions or if you travel or work in places where you may be exposed to hepatitis B. Haemophilus influenzae type b (Hib) vaccine  You may need this if you have certain conditions. You may receive vaccines as individual doses or as more than one vaccine together in one shot (combination vaccines). Talk with your health care provider about the risks and benefits of combination vaccines. What tests do I need? Blood tests  Lipid and cholesterol levels. These may be checked every 5 years, or more frequently depending on your overall health.  Hepatitis C test.  Hepatitis B test. Screening  Lung cancer screening. You may have this screening every year starting at age 70 if you have a 30-pack-year history of smoking and currently smoke or have quit within the past 15 years.  Colorectal cancer screening. All adults should have this screening starting at age 70 and continuing until age 70. Your health care provider may recommend screening at age 70 if you are at increased risk. You will have tests every 1-10 years, depending on your results and the type of screening test.  Diabetes screening. This is done by checking your blood sugar (glucose) after you have not eaten for a while (fasting). You may have this done every 1-3 years.  Mammogram. This may be done every 1-2 years. Talk with your health care provider about how often you should have regular mammograms.  BRCA-related cancer screening. This may be done if you have a family history of breast, ovarian, tubal, or peritoneal cancers.  Other tests  Sexually transmitted disease (STD) testing.  Bone density scan. This is done to screen for osteoporosis. You may have this done starting at age 70. Follow these instructions at home: Eating and drinking  Eat a diet that includes fresh fruits and vegetables, whole grains, lean protein, and low-fat dairy products. Limit  your intake of foods with high amounts of sugar, saturated fats, and salt.  Take vitamin and mineral supplements as recommended by your health care provider.  Do not drink alcohol if your health care provider tells you not to drink.  If you drink alcohol: ? Limit how much you have to 0-1 drink a day. ? Be aware of how much alcohol is in your drink. In the [place] one drink equals one 12 oz bottle of beer (355 mL), one 5 oz glass of wine (148 mL), or one 1 oz glass of hard liquor (44 mL). Lifestyle  Take daily care of your teeth and gums.  Stay active. Exercise for at least 30 minutes on 5 or more days each week.  Do not use any products that contain nicotine or tobacco, such as cigarettes, e-cigarettes, and chewing tobacco. If you need help quitting, ask your health care provider.  If you are sexually active, practice safe sex. Use a condom or other form of protection in order to prevent STIs (sexually transmitted infections).  Talk with your health care provider about taking a low-dose aspirin or statin. What's next?  Go to your health care provider once a year for a well check visit.  Ask your health care provider how often you should have your eyes and teeth checked.  Stay up to date on all vaccines. This information is not intended to replace advice given to you by your health care provider. Make sure you discuss any questions you have with your health care provider. Document Revised: 04/07/2018 Document Reviewed: 04/07/2018 Elsevier Patient Education  2020 [employer].

## 2019-06-01 ENCOUNTER — Encounter: Payer: Self-pay | Admitting: Family Medicine

## 2019-06-01 LAB — CMP14+EGFR
ALT: 11 IU/L (ref 0–32)
AST: 12 IU/L (ref 0–40)
Albumin/Globulin Ratio: 2 (ref 1.2–2.2)
Albumin: 4.6 g/dL (ref 3.8–4.8)
Alkaline Phosphatase: 94 IU/L (ref 39–117)
BUN/Creatinine Ratio: 14 (ref 12–28)
BUN: 15 mg/dL (ref 8–27)
Bilirubin Total: 0.4 mg/dL (ref 0.0–1.2)
CO2: 23 mmol/L (ref 20–29)
Calcium: 10.2 mg/dL (ref 8.7–10.3)
Chloride: 98 mmol/L (ref 96–106)
Creatinine, Ser: 1.11 mg/dL — ABNORMAL HIGH (ref 0.57–1.00)
GFR calc Af Amer: 59 mL/min/{1.73_m2} — ABNORMAL LOW (ref >59)
GFR calc non Af Amer: 51 mL/min/{1.73_m2} — ABNORMAL LOW (ref >59)
Globulin, Total: 2.3 g/dL (ref 1.5–4.5)
Glucose: 204 mg/dL — ABNORMAL HIGH (ref 65–99)
Potassium: 5 mmol/L (ref 3.5–5.2)
Sodium: 139 mmol/L (ref 134–144)
Total Protein: 6.9 g/dL (ref 6.0–8.5)

## 2019-06-01 MED ORDER — SITAGLIPTIN PHOSPHATE 25 MG PO TABS: 25.0000 mg | ORAL_TABLET | Freq: Every day | ORAL | 1 refills | Status: DC

## 2019-09-28 ENCOUNTER — Ambulatory Visit (INDEPENDENT_AMBULATORY_CARE_PROVIDER_SITE_OTHER): Payer: Medicare Other | Admitting: Family Medicine

## 2019-09-28 ENCOUNTER — Encounter: Payer: Self-pay | Admitting: Family Medicine

## 2019-09-28 DIAGNOSIS — E119 Type 2 diabetes mellitus without complications: Secondary | ICD-10-CM | POA: Diagnosis not present

## 2019-09-28 LAB — CMP14+EGFR
ALT: 10 IU/L (ref 0–32)
AST: 9 IU/L (ref 0–40)
Albumin/Globulin Ratio: 2 (ref 1.2–2.2)
Albumin: 4.5 g/dL (ref 3.8–4.8)
Alkaline Phosphatase: 89 IU/L (ref 48–121)
BUN/Creatinine Ratio: 10 — ABNORMAL LOW (ref 12–28)
BUN: 13 mg/dL (ref 8–27)
Bilirubin Total: 0.3 mg/dL (ref 0.0–1.2)
CO2: 22 mmol/L (ref 20–29)
Calcium: 9.3 mg/dL (ref 8.7–10.3)
Chloride: 100 mmol/L (ref 96–106)
Creatinine, Ser: 1.26 mg/dL — ABNORMAL HIGH (ref 0.57–1.00)
GFR calc Af Amer: 50 mL/min/{1.73_m2} — ABNORMAL LOW (ref >59)
GFR calc non Af Amer: 44 mL/min/{1.73_m2} — ABNORMAL LOW (ref >59)
Globulin, Total: 2.2 g/dL (ref 1.5–4.5)
Glucose: 161 mg/dL — ABNORMAL HIGH (ref 65–99)
Potassium: 4.8 mmol/L (ref 3.5–5.2)
Sodium: 139 mmol/L (ref 134–144)
Total Protein: 6.7 g/dL (ref 6.0–8.5)

## 2019-09-28 LAB — BAYER DCA HB A1C WAIVED: HB A1C (BAYER DCA - WAIVED): 6.8 % (ref <7.0)

## 2019-09-28 NOTE — Progress Notes (Signed)
Virtual Visit via Telephone Note  I connected with Jeanne Medina on 09/28/19 at 8:54 AM by telephone and verified that I am speaking with the correct person using two identifiers. HARLA MENSCH is currently located at home and nobody is currently with her during this visit. The provider, Loman Brooklyn, FNP is located in their home at time of visit.  I discussed the limitations, risks, security and privacy concerns of performing an evaluation and management service by telephone and the availability of in person appointments. I also discussed with the patient that there may be a patient responsible charge related to this service. The patient expressed understanding and agreed to proceed.  Subjective: PCP: Loman Brooklyn, FNP  Chief Complaint  Patient presents with  . Diabetes   Diabetes: Patient presents for follow up of diabetes. Current symptoms include: none. Known diabetic complications: none. Medication compliance: yes. Current diet: in general, a "healthy" diet  . Current exercise: working out 3x/week. Home blood sugar records: fasting average 100-125. She does have occasional lows where she feels jittery - most recently 58. Is she  on ACE inhibitor or angiotensin II receptor blocker? Yes. Is she on a statin? Yes.   Lab Results  Component Value Date   HGBA1C 7.8 (H) 05/31/2019   HGBA1C 6.6 11/04/2018   Lab Results  Component Value Date   LDLCALC 71 11/04/2018   CREATININE 1.11 (H) 05/31/2019     ROS: Per HPI  Current Outpatient Medications:  .  amLODipine (NORVASC) 10 MG tablet, Take 1 tablet (10 mg total) by mouth daily., Disp: 90 tablet, Rfl: 3 .  citalopram (CELEXA) 40 MG tablet, Take 1 tablet (40 mg total) by mouth daily., Disp: 90 tablet, Rfl: 3 .  diphenhydrAMINE (ALLERGY RELIEF) 25 MG tablet, Take 25 mg by mouth at bedtime as needed (for allergies.)., Disp: , Rfl:  .  fluticasone (FLONASE) 50 MCG/ACT nasal spray, Place 1 spray into both nostrils 2 (two) times  daily., Disp: 16 g, Rfl: 5 .  glipiZIDE (GLUCOTROL) 10 MG tablet, Take 1 tablet (10 mg total) by mouth 2 (two) times daily before a meal., Disp: 180 tablet, Rfl: 3 .  ibuprofen (ADVIL,MOTRIN) 200 MG tablet, Take 200 mg by mouth every 8 (eight) hours as needed (for pain/headaches.)., Disp: , Rfl:  .  L-Lysine 500 MG CAPS, Take by mouth., Disp: , Rfl:  .  lisinopril-hydrochlorothiazide (ZESTORETIC) 20-25 MG tablet, Take 1 tablet by mouth daily., Disp: 90 tablet, Rfl: 3 .  loratadine (CLARITIN) 10 MG tablet, Take 1 tablet (10 mg total) by mouth daily., Disp: 90 tablet, Rfl: 3 .  lovastatin (MEVACOR) 40 MG tablet, Take 1 tablet (40 mg total) by mouth at bedtime., Disp: 90 tablet, Rfl: 3 .  metFORMIN (GLUCOPHAGE) 500 MG tablet, Take 2 tablets (1,000 mg total) by mouth 2 (two) times daily., Disp: 360 tablet, Rfl: 1 .  sitaGLIPtin (JANUVIA) 25 MG tablet, Take 1 tablet (25 mg total) by mouth daily., Disp: 90 tablet, Rfl: 1 .  traZODone (DESYREL) 50 MG tablet, Take 1 tablet (50 mg total) by mouth at bedtime as needed. for sleep, Disp: 90 tablet, Rfl: 3  Allergies  Allergen Reactions  . Sulfa Antibiotics Hives and Itching    welts   Past Medical History:  Diagnosis Date  . Anxiety   . Arthritis    osteoarthritis  . Asthma   . Diabetes mellitus without complication ([place])   . Hypercholesteremia   . Hypertension  Observations/Objective: A&O  No respiratory distress or wheezing audible over the phone Mood, judgement, and thought processes all WNL   Assessment and Plan: 1. Diabetes mellitus without complication [facility]) Patient has not yet had A1c completed as this is a telephone visit.  She will come later today. - Medications: Continue current medications until A1c results, at which time changes may be made - Home glucose monitoring: Continue monitoring - Patient is currently taking a statin. Patient is taking an ACE-inhibitor/ARB.  - Last foot exam: 11/04/2018 - Last diabetic eye exam:  January 2021 - Urine Microalbumin/Creat Ratio: 11/04/2018 - CMP14+EGFR - Bayer DCA Hb A1c Waived   Follow Up Instructions: As directed after labs result.   I discussed the assessment and treatment plan with the patient. The patient was provided an opportunity to ask questions and all were answered. The patient agreed with the plan and demonstrated an understanding of the instructions.   The patient was advised to call back or seek an in-person evaluation if the symptoms worsen or if the condition fails to improve as anticipated.  The above assessment and management plan was discussed with the patient. The patient verbalized understanding of and has agreed to the management plan. Patient is aware to call the clinic if symptoms persist or worsen. Patient is aware when to return to the clinic for a follow-up visit. Patient educated on when it is appropriate to go to the emergency department.   Time call ended: 9:05 AM  I provided 13 minutes of non-face-to-face time during this encounter.  Hendricks Limes, MSN, APRN, FNP-C [facility] 09/28/19

## 2019-09-30 ENCOUNTER — Telehealth: Payer: Self-pay | Admitting: Pharmacist

## 2019-09-30 NOTE — Telephone Encounter (Signed)
-  Per PCP, patient with complaints of hypoglycemia.   -Patient has increased physical activity/exercise -Recommend to decrease glipizide 10mg  BID --> 5mg  BID for now.  -Continue to check BGs as directed  -Continue Januvia 25mg  daily with plans to increase to 50mg  daily -Consider transitioning to SGLT2 for cardiac/renal benefits -Will follow

## 2019-10-02 ENCOUNTER — Ambulatory Visit (INDEPENDENT_AMBULATORY_CARE_PROVIDER_SITE_OTHER): Payer: Medicare Other | Admitting: Pharmacist

## 2019-10-02 DIAGNOSIS — E119 Type 2 diabetes mellitus without complications: Secondary | ICD-10-CM

## 2019-10-02 NOTE — Progress Notes (Signed)
    10/02/2019 Name: Jeanne Medina MRN: [id number] DOB: 04/10/1951   S:  41 yoF presents for diabetes evaluation, education, and management Patient was referred and last seen by Primary Care Provider on 09/28/19 Insurance coverage/medication affordability: BCBS MEDICARE  I connected with  [name] on 10/15/19 by a video enabled telemedicine application and verified that I am speaking with the correct person using two identifiers.   I discussed the limitations of evaluation and management by telemedicine. The patient expressed understanding and agreed to proceed.   Patient reports adherence with medications. . Current diabetes medications include: METFORMIN 1G BID, GLIPIZIDE IR 10MG  BID, januvia, farxiga . Current hypertension medications include: amlodipine, lisinopril/hctz Goal 130/80 . Current hyperlipidemia medications include: lovastatin .  Patient reports hypoglycemic events.   Patient reported dietary habits: Eats 2-3 meals/day Discussed meal planning options and Plate method for health eating Avoid sugary drinks and desserts Incorporate balanced protein, non starchy veggies, 1 serving of carbohydrate Increase water intake Increase physical activity as able.  Patient-reported exercise habits: INCREASED PHYSICAL ACTIVITY--PATIENT WORKING OUT 3-4 TIMES WEEKLY  Patient denies nocturia (nighttime urination).  Patient denies neuropathy (nerve pain).  Patient denies visual changes.   O:  Lab Results  Component Value Date   HGBA1C 6.8 09/28/2019     Lipid Panel     Component Value Date/Time   CHOL 158 11/04/2018 1345   TRIG 105 11/04/2018 1345   HDL 66 11/04/2018 1345   CHOLHDL 2.4 11/04/2018 1345   LDLCALC 71 11/04/2018 1345    Home fasting blood sugars: 85-110 2 hour post-meal/random blood sugars: N/A.  HYPOGLYCEMIA:   10/02/19 78 (1030AM)  10/01/19 81 (1000AM)   A/P:  Diabetes T2DM.  Patient currently having reported hypoglycemia due to increased  physical activity and better diet control. Patient is able to verbalize appropriate hypoglycemia management plan. Patient is adherent with medication.   -CONTINUE Farxiga  -CONTINUE metformin  -Decrease januvia to 50mg  daily  -Decreased glipizide to 5mg  IR twice daily---will work to titrate off  -GOAL IS TO BE on METFORMIN + FARXIGA  -Extensively discussed pathophysiology of diabetes, recommended lifestyle interventions, dietary effects on blood sugar control  -Counseled on s/sx of and management of hypoglycemia  -Next A1c anticipated 12/2019  Follow up Pharmacist telephone Clinic Visit in July 2021.   , PharmD, BCPS Clinical Pharmacist, [facility]  II Phone [phone]

## 2019-10-03 ENCOUNTER — Ambulatory Visit (INDEPENDENT_AMBULATORY_CARE_PROVIDER_SITE_OTHER): Payer: Medicare Other

## 2019-10-03 DIAGNOSIS — Z0000 Encounter for general adult medical examination without abnormal findings: Secondary | ICD-10-CM

## 2019-10-03 NOTE — Patient Instructions (Signed)
  MEDICARE ANNUAL WELLNESS VISIT Health Maintenance Summary and Written Plan of Care  Ms. Jeanne Medina ,  Thank you for allowing me to perform your Medicare Annual Wellness Visit and for your ongoing commitment to your health.   Health Maintenance & Immunization History Health Maintenance  Topic Date Due  . COVID-19 Vaccine (1) 10/14/2019 (Originally 03/25/1963)  . DEXA SCAN  11/04/2019 (Originally 03/26/2015)  . COLONOSCOPY  11/04/2019 (Originally 03/25/2000)  . FOOT EXAM  11/04/2019  . INFLUENZA VACCINE  11/26/2019  . HEMOGLOBIN A1C  03/29/2020  . OPHTHALMOLOGY EXAM  04/27/2020  . MAMMOGRAM  07/04/2020  . TETANUS/TDAP  06/12/2027  . Hepatitis C Screening  Completed  . PNA vac Low Risk Adult  Completed   Immunization History  Administered Date(s) Administered  . Influenza Split 02/10/2017  . Influenza,inj,Quad PF,6+ Mos 01/26/2019  . Influenza,inj,quad, With Preservative 01/28/2018  . Influenza-Unspecified 03/14/2007, 02/09/2014, 02/08/2015, 01/28/2016, 01/28/2018  . Pneumococcal Conjugate-13 02/09/2014  . Pneumococcal Polysaccharide-23 05/14/2015  . Td 05/09/1998  . Tdap 06/11/2017  . Zoster 12/02/2012    These are the patient goals that we discussed: Goals Addressed            This Visit's Progress   . Client will verbalize knowledge of diabetes self-management as evidenced by Hgb A1C <7 or as defined by provider.       Diabetes self management actions:  Glucose monitoring per provider recommendations  Perform Quality checks on blood meter  Eat Healthy  Check feet daily  Visit provider every 3-6 months as directed  Hbg A1C level every 3-6 months.  Eye Exam yearly        This is a list of Health Maintenance Items that are overdue or due now: There are no preventive care reminders to display for this patient.   Orders/Referrals Placed Today: No orders of the defined types were placed in this encounter.  (Contact our referral department at [phone] if  you have not spoken with someone about your referral appointment within the next 5 days)    Follow-up Plan  Scheduled with Deliah Boston, FNP 01/02/2020 at 8:35 am

## 2019-10-03 NOTE — Progress Notes (Addendum)
MEDICARE ANNUAL WELLNESS VISIT  10/03/2019  Telephone Visit Disclaimer This Medicare AWV was conducted by telephone due to national recommendations for restrictions regarding the COVID-19 Pandemic (e.g. social distancing).  I verified, using two identifiers, that I am speaking with Jeanne Medina or their authorized healthcare agent. I discussed the limitations, risks, security, and privacy concerns of performing an evaluation and management service by telephone and the potential availability of an in-person appointment in the future. The patient expressed understanding and agreed to proceed.   Subjective:  Jeanne Medina is a 69 y.o. female patient of Gwenlyn Fudge, FNP who had a Medicare Annual Wellness Visit today via telephone. Jeanne Medina is Retired and lives with their spouse. she has no children. she reports that she is socially active and does interact with friends/family regularly. she is minimally physically active and enjoys card games and word searches.  Patient Care Team: Gwenlyn Fudge, FNP as PCP - General (Family Medicine) Danella Maiers, [facility] (Pharmacist)  Advanced Directives 10/03/2019 10/06/2016 08/18/2016 08/13/2016  Does Patient Have a Medical Advance Directive? Yes No No No  Type of Advance Directive Healthcare Power of Attorney - - -  Does patient want to make changes to medical advance directive? No - Patient declined - - -  Copy of Healthcare Power of Attorney in Chart? No - copy requested - [facility] Utilization Over the Past 12 Months: # of hospitalizations or ER visits: 0 # of surgeries: 0  Review of Systems    Patient reports that her overall health is worse compared to last year.  Negative except Upper back pain. She will try tylenol arthritis. Patient believes the pain to be from osteoarthritis  Patient Reported Readings (BP, Pulse, CBG-104, Weight, etc)   Pain Assessment Pain : 0-10 Pain Score: 5  Pain Type: Chronic pain Pain Location:  Back Pain Orientation: Upper Pain Descriptors / Indicators: Discomfort Pain Onset: More than a month ago Pain Frequency: Intermittent Pain Relieving Factors: Tylenol Arthritis  Pain Relieving Factors: Tylenol Arthritis  Current Medications & Allergies (verified) Allergies as of 10/03/2019       Reactions   Sulfa Antibiotics Hives, Itching   welts        Medication List        Accurate as of October 03, 2019  2:26 PM. If you have any questions, ask your nurse or doctor.          STOP taking these medications    ibuprofen 200 MG tablet Commonly known as: ADVIL       TAKE these medications    Allergy Relief 25 MG tablet Generic drug: diphenhydrAMINE Take 25 mg by mouth at bedtime as needed (for allergies.).   amLODipine 10 MG tablet Commonly known as: NORVASC Take 1 tablet (10 mg total) by mouth daily.   citalopram 40 MG tablet Commonly known as: CELEXA Take 1 tablet (40 mg total) by mouth daily.   fluticasone 50 MCG/ACT nasal spray Commonly known as: Flonase Place 1 spray into both nostrils 2 (two) times daily.   glipiZIDE 10 MG tablet Commonly known as: GLUCOTROL Take 1 tablet (10 mg total) by mouth 2 (two) times daily before a meal.   L-Lysine 500 MG Caps Take by mouth.   lisinopril-hydrochlorothiazide 20-25 MG tablet Commonly known as: ZESTORETIC Take 1 tablet by mouth daily.   loratadine 10 MG tablet Commonly known as: CLARITIN Take 1 tablet (10 mg total) by mouth daily.  lovastatin 40 MG tablet Commonly known as: MEVACOR Take 1 tablet (40 mg total) by mouth at bedtime.   metFORMIN 500 MG tablet Commonly known as: GLUCOPHAGE Take 2 tablets (1,000 mg total) by mouth 2 (two) times daily.   sitaGLIPtin 25 MG tablet Commonly known as: Januvia Take 1 tablet (25 mg total) by mouth daily.   traZODone 50 MG tablet Commonly known as: DESYREL Take 1 tablet (50 mg total) by mouth at bedtime as needed. for sleep        History  (reviewed): Past Medical History:  Diagnosis Date   Anxiety    Arthritis    osteoarthritis   Asthma    Diabetes mellitus without complication (HCC)    Hypercholesteremia    Hypertension    Past Surgical History:  Procedure Laterality Date   CATARACT EXTRACTION W/PHACO Left 08/18/2016   Procedure: CATARACT EXTRACTION PHACO AND INTRAOCULAR LENS PLACEMENT (IOC);  Surgeon: Jethro Bolus, MD;  Location: [facility];  Service: Ophthalmology;  Laterality: Left;  CDE: 6.05   CATARACT EXTRACTION W/PHACO Left 09/01/2016   Procedure: REPOSITIONING OF LEFT IOL LENS;  Surgeon: Jethro Bolus, MD;  Location: [facility];  Service: Ophthalmology;  Laterality: Left;   CATARACT EXTRACTION W/PHACO Right 10/06/2016   Procedure: CATARACT EXTRACTION PHACO AND INTRAOCULAR LENS PLACEMENT (IOC);  Surgeon: Jethro Bolus, MD;  Location: [facility];  Service: Ophthalmology;  Laterality: Right;  CDE: 6.21   Family History  Problem Relation Age of Onset   Heart failure Mother    Hypertension Mother    Heart disease Mother    Stroke Father    Heart disease Father    Hypertension Father    Dementia Sister    Gallbladder disease Brother    Pancreatic cancer Brother    Heart attack Brother    Rheum arthritis Sister    Heart disease Sister    Cancer Sister        tonsils   Diabetes Sister    Diabetes Sister    Heart disease Sister    Rheum arthritis Sister    Social History   Socioeconomic History   Marital status: Married    Spouse name: Not on file   Number of children: Not on file   Years of education: Not on file   Highest education level: Not on file  Occupational History   Not on file  Tobacco Use   Smoking status: Former Smoker    Packs/day: 0.25    Years: 20.00    Pack years: 5.00    Types: Cigarettes    Quit date: 08/13/1980    Years since quitting: 39.1   Smokeless tobacco: Never Used  Substance and Sexual Activity   Alcohol use: No   Drug use: No   Sexual activity: Yes    Birth  control/protection: Post-menopausal  Other Topics Concern   Not on file  Social History Narrative   Not on file   Social Determinants of Health   Financial Resource Strain:    Difficulty of Paying Living Expenses:   Food Insecurity:    Worried About Programme researcher, broadcasting/film/video in the Last Year:    Barista in the Last Year:   Transportation Needs:    Freight forwarder (Medical):    Lack of Transportation (Non-Medical):   Physical Activity:    Days of Exercise per Week:    Minutes of Exercise per Session:   Stress:    Feeling of Stress :   Social Connections:  Frequency of Communication with Friends and Family:    Frequency of Social Gatherings with Friends and Family:    Attends Religious Services:    Active Member of Clubs or Organizations:    Attends Archivist Meetings:    Marital Status:     Activities of Daily Living In your present state of health, do you have any difficulty performing the following activities: 10/03/2019  Hearing? N  Vision? N  Difficulty concentrating or making decisions? N  Walking or climbing stairs? N  Dressing or bathing? N  Doing errands, shopping? N  Preparing Food and eating ? N  Using the Toilet? N  In the past six months, have you accidently leaked urine? N  Do you have problems with loss of bowel control? N  Managing your Medications? N  Managing your Finances? N  Housekeeping or managing your Housekeeping? N  Some recent data might be hidden    Patient Education/ Literacy How often do you need to have someone help you when you read instructions, pamphlets, or other written materials from your doctor or pharmacy?: 1 - Never What is the last grade level you completed in school?: Some College  Exercise Current Exercise Habits: Structured exercise class, Type of exercise: walking;strength training/weights, Time (Minutes): 60, Frequency (Times/Week): 3, Weekly Exercise (Minutes/Week): 180, Intensity:  Mild  Diet Patient reports consuming 3 meals a day and 2 snack(s) a day Patient reports that her primary diet is: Diabetic Patient reports that she does have regular access to food.   Depression Screen PHQ 2/9 Scores 10/03/2019 05/31/2019 11/04/2018  PHQ - 2 Score 0 0 0  PHQ- 9 Score - 1 -     Fall Risk Fall Risk  10/03/2019 05/31/2019 11/04/2018  Falls in the past year? 0 0 0     Objective:  [name] seemed alert and oriented and she participated appropriately during our telephone visit.  Blood Pressure Weight BMI  BP Readings from Last 3 Encounters:  05/31/19 127/70  11/04/18 (!) 142/64  10/06/16 131/67   Wt Readings from Last 3 Encounters:  05/31/19 202 lb 12.8 oz (92 kg)  11/04/18 208 lb (94.3 kg)  10/06/16 187 lb (84.8 kg)   BMI Readings from Last 1 Encounters:  05/31/19 35.92 kg/m    *Unable to obtain current vital signs, weight, and BMI due to telephone visit type  Hearing/Vision  Madeline did not seem to have difficulty with hearing/understanding during the telephone conversation Reports that she has had a formal eye exam by an eye care professional within the past year Reports that she has not had a formal hearing evaluation within the past year *Unable to fully assess hearing and vision during telephone visit type  Cognitive Function: 6CIT Screen 10/03/2019  What Year? 0 points  What month? 0 points  What time? 0 points  Count back from 20 0 points  Months in reverse 0 points  Repeat phrase 0 points  Total Score 0   (Normal:0-7, Significant for Dysfunction: >8)  Normal Cognitive Function Screening: Yes   Immunization & Health Maintenance Record Immunization History  Administered Date(s) Administered   Influenza Split 02/10/2017   Influenza,inj,Quad PF,6+ Mos 01/26/2019   Influenza,inj,quad, With Preservative 01/28/2018   Influenza-Unspecified 03/14/2007, 02/09/2014, 02/08/2015, 01/28/2016, 01/28/2018   Pneumococcal Conjugate-13 02/09/2014    Pneumococcal Polysaccharide-23 05/14/2015   Td 05/09/1998   Tdap 06/11/2017   Zoster 12/02/2012    Health Maintenance  Topic Date Due   COVID-19 Vaccine (1) 10/14/2019 (Originally [birth year]/1962)  DEXA SCAN  11/04/2019 (Originally 03/26/2015)   COLONOSCOPY  11/04/2019 (Originally 03/25/2000)   Med[place] EXAM  11/04/2019   INFLUENZA VACCINE  11/26/2019   HEMOGLOBIN A1C  03/29/2020   OPHTHALMOLOGY EXAM  04/27/2020   MAMMOGRAM  07/04/2020   TETANUS/TDAP  06/12/2027   Hepatitis C Screening  Completed   PNA vac Low Risk Adult  Completed       Assessment  This is a routine wellness examination for Jeanne Medina.  Health Maintenance: Due or Overdue There are no preventive care reminders to display for this patient.  Jeanne Medina does not need a referral for Community Assistance: Care Management:   no Social Work:    no Prescription Assistance:  no Nutrition/Diabetes Education:  no   Plan:  Personalized Goals Goals Addressed             This Visit's Progress    Client will verbalize knowledge of diabetes self-management as evidenced by Hgb A1C <7 or as defined by provider.       Diabetes self management actions: Glucose monitoring per provider recommendations Perform Quality checks on blood meter Eat Healthy Check feet daily Visit provider every 3-6 months as directed Hbg A1C level every 3-6 months. Eye Exam yearly       Personalized Health Maintenance & Screening Recommendations    Lung Cancer Screening Recommended: no (Low Dose CT Chest recommended if Age 50-80 years, 30 pack-year currently smoking OR have quit w/in past 15 years) Hepatitis C Screening recommended: no HIV Screening recommended: no  Advanced Directives: Written information was not prepared per patient's request.  Referrals & Orders No orders of the defined types were placed in this encounter.   Follow-up Plan Follow-up with Gwenlyn Fudge, FNP as planned Schedule 01/02/2020   I have  personally reviewed and noted the following in the patient's chart:   Medical and social history Use of alcohol, tobacco or illicit drugs  Current medications and supplements Functional ability and status Nutritional status Physical activity Advanced directives List of other physicians Hospitalizations, surgeries, and ER visits in previous 12 months Vitals Screenings to include cognitive, depression, and falls Referrals and appointments  In addition, I have reviewed and discussed with Jeanne Medina certain preventive protocols, quality metrics, and best practice recommendations. A written personalized care plan for preventive services as well as general preventive health recommendations is available and can be mailed to the patient at her request.      Suzan Slick Aashka Salomone,LPN  06/05/9369    I have reviewed the CCM documentation and agree with the written assessment and plan of care.  Jannifer Rodney, FNP

## 2019-11-01 ENCOUNTER — Telehealth: Payer: Self-pay | Admitting: Pharmacist

## 2019-11-01 ENCOUNTER — Ambulatory Visit: Payer: Medicare Other | Admitting: Pharmacist

## 2019-11-01 MED ORDER — DAPAGLIFLOZIN PROPANEDIOL 10 MG PO TABS: 10.0000 mg | ORAL_TABLET | Freq: Every day | ORAL | 3 refills | Status: DC

## 2019-11-01 NOTE — Telephone Encounter (Signed)
START Farxiga 5mg  daily Samples , EXP 12/23 10mg  tabs given, instructed patient to cut in half Continue metformin Continue glipizide for now STOP Januvia Patient denies hypoglycemia. Reports FBG 120-167  Will need to send RX to [facility] Department (patient currently gets Januvia there)

## 2019-11-03 ENCOUNTER — Telehealth: Payer: Self-pay | Admitting: *Deleted

## 2019-11-03 ENCOUNTER — Telehealth: Payer: Self-pay | Admitting: Pharmacist

## 2019-11-03 MED ORDER — DAPAGLIFLOZIN PROPANEDIOL 10 MG PO TABS: 10.0000 mg | ORAL_TABLET | Freq: Every day | ORAL | 3 refills | Status: DC

## 2019-11-03 NOTE — Telephone Encounter (Signed)
PA in process for Farxiga Key: [facility]  Your information has been submitted to [employer] [place]. [employer] [place] will review the request and notify you of the determination decision directly, typically within 3 business days of your submission and once all necessary information is received.  You will also receive your request decision electronically. To check for an update later, open the request again from your dashboard.  If [employer] [place] has not responded within the specified timeframe or if you have any questions about your PA submission, contact [employer] [place] directly at [address] [facility]) [phone] or (PDP) [phone].

## 2019-11-03 NOTE — Telephone Encounter (Signed)
PA denied  Patient will use free coupon to get 1st fill Subsequent fills will be sent to [facility] Department (patient already enrolled in patient assistance

## 2019-11-03 NOTE — Telephone Encounter (Signed)
[facility] Jeanne Medina [name] - Rx #: [id number]  PA started for Farxiga 10mg  daily  Your information has been submitted to [facility]. [employer] [place] will review the request and notify you of the determination decision directly, typically within 3 business days of your submission and once all necessary information is received.  You will also receive your request decision electronically. To check for an update later, open the request again from your dashboard.  If [name] has not responded within the specified timeframe or if you have any questions about your PA submission, contact [employer] [place] directly at [facility]) [phone] or (PDP) [phone].

## 2019-11-14 ENCOUNTER — Telehealth: Payer: Self-pay | Admitting: Pharmacist

## 2019-11-14 ENCOUNTER — Encounter: Payer: Self-pay | Admitting: Family Medicine

## 2019-11-14 NOTE — Telephone Encounter (Signed)
Call placed to patient  FBG in the 150-170s (running higher, denies hypoglycemia) Instructed patient to increase to [place] 10mg  daily RX faxed to [facility] de[facility] for prescription assistance  Continue metformin Continue glipizide XL  Encouraged patient to call if samples needed

## 2019-11-28 ENCOUNTER — Telehealth: Payer: Self-pay | Admitting: Pharmacist

## 2019-11-28 NOTE — Telephone Encounter (Signed)
Patient complaining of yeast infection, nausea & watery stools from farxiga 10mg .  Yeast infections are a common complaint, however nausea & watery stools are not. Encouraged patient to use baby wipes for improved hygiene, has fluconazole on hand.  Instructed patient to call if symptoms do not improve.  Will repeat fluconazole x1 and hold Farxiga Recommended patient cut dose in half (5mg  daily) to see if this will help Patient willing to stick it out to see if side effects stop  Encouraged patient to call before the weekend to update progress.  Discussed with PCP

## 2019-12-05 ENCOUNTER — Telehealth: Payer: Self-pay | Admitting: Pharmacist

## 2019-12-06 MED ORDER — FLUCONAZOLE 150 MG PO TABS: ORAL_TABLET | ORAL | 1 refills | Status: DC

## 2019-12-06 NOTE — Telephone Encounter (Signed)
FBG 130-160 Patient was able to pick up Farxiga from the [facility] department for $2 RX for diflucan called per discussion with PCP Diflucan 150mg  tablet x1 repeat on day 3 if symptoms persist Encouraged patient to stay hydrated and to call if symptoms persist

## 2019-12-18 ENCOUNTER — Other Ambulatory Visit: Payer: Self-pay

## 2019-12-18 DIAGNOSIS — J302 Other seasonal allergic rhinitis: Secondary | ICD-10-CM

## 2019-12-18 MED ORDER — LORATADINE 10 MG PO TABS: 10.0000 mg | ORAL_TABLET | Freq: Every day | ORAL | 3 refills | Status: DC

## 2019-12-20 ENCOUNTER — Encounter: Payer: Self-pay | Admitting: Family Medicine

## 2019-12-20 MED ORDER — MECLIZINE HCL 12.5 MG PO TABS: 12.5000 mg | ORAL_TABLET | Freq: Three times a day (TID) | ORAL | 0 refills | Status: DC | PRN

## 2020-01-02 ENCOUNTER — Ambulatory Visit: Payer: Medicare Other | Admitting: Family Medicine

## 2020-01-02 ENCOUNTER — Other Ambulatory Visit: Payer: Self-pay

## 2020-01-02 ENCOUNTER — Encounter: Payer: Self-pay | Admitting: Family Medicine

## 2020-01-02 ENCOUNTER — Ambulatory Visit (INDEPENDENT_AMBULATORY_CARE_PROVIDER_SITE_OTHER): Payer: Medicare Other | Admitting: Family Medicine

## 2020-01-02 ENCOUNTER — Telehealth: Payer: Self-pay | Admitting: Pharmacist

## 2020-01-02 VITALS — BP 146/85 | HR 97 | Temp 96.7°F | Ht 63.0 in | Wt 193.4 lb

## 2020-01-02 DIAGNOSIS — E7800 Pure hypercholesterolemia, unspecified: Secondary | ICD-10-CM | POA: Diagnosis not present

## 2020-01-02 DIAGNOSIS — N898 Other specified noninflammatory disorders of vagina: Secondary | ICD-10-CM | POA: Diagnosis not present

## 2020-01-02 DIAGNOSIS — E119 Type 2 diabetes mellitus without complications: Secondary | ICD-10-CM | POA: Diagnosis not present

## 2020-01-02 DIAGNOSIS — I10 Essential (primary) hypertension: Secondary | ICD-10-CM

## 2020-01-02 LAB — CMP14+EGFR
ALT: 11 IU/L (ref 0–32)
AST: 9 IU/L (ref 0–40)
Albumin/Globulin Ratio: 1.7 (ref 1.2–2.2)
Albumin: 4.3 g/dL (ref 3.8–4.8)
Alkaline Phosphatase: 78 IU/L (ref 48–121)
BUN/Creatinine Ratio: 11 — ABNORMAL LOW (ref 12–28)
BUN: 14 mg/dL (ref 8–27)
Bilirubin Total: 0.4 mg/dL (ref 0.0–1.2)
CO2: 23 mmol/L (ref 20–29)
Calcium: 10 mg/dL (ref 8.7–10.3)
Chloride: 99 mmol/L (ref 96–106)
Creatinine, Ser: 1.23 mg/dL — ABNORMAL HIGH (ref 0.57–1.00)
GFR calc Af Amer: 52 mL/min/{1.73_m2} — ABNORMAL LOW (ref >59)
GFR calc non Af Amer: 45 mL/min/{1.73_m2} — ABNORMAL LOW (ref >59)
Globulin, Total: 2.5 g/dL (ref 1.5–4.5)
Glucose: 146 mg/dL — ABNORMAL HIGH (ref 65–99)
Potassium: 4.5 mmol/L (ref 3.5–5.2)
Sodium: 138 mmol/L (ref 134–144)
Total Protein: 6.8 g/dL (ref 6.0–8.5)

## 2020-01-02 LAB — CBC WITH DIFFERENTIAL/PLATELET
Basophils Absolute: 0.1 10*3/uL (ref 0.0–0.2)
Basos: 1 %
EOS (ABSOLUTE): 0.4 10*3/uL (ref 0.0–0.4)
Eos: 3 %
Hematocrit: 39.9 % (ref 34.0–46.6)
Hemoglobin: 12.6 g/dL (ref 11.1–15.9)
Immature Grans (Abs): 0 10*3/uL (ref 0.0–0.1)
Immature Granulocytes: 0 %
Lymphocytes Absolute: 2 10*3/uL (ref 0.7–3.1)
Lymphs: 18 %
MCH: 27.2 pg (ref 26.6–33.0)
MCHC: 31.6 g/dL (ref 31.5–35.7)
MCV: 86 fL (ref 79–97)
Monocytes Absolute: 0.6 10*3/uL (ref 0.1–0.9)
Monocytes: 5 %
Neutrophils Absolute: 8.1 10*3/uL — ABNORMAL HIGH (ref 1.4–7.0)
Neutrophils: 73 %
Platelets: 438 10*3/uL (ref 150–450)
RBC: 4.63 x10E6/uL (ref 3.77–5.28)
RDW: 13 % (ref 11.7–15.4)
WBC: 11.1 10*3/uL — ABNORMAL HIGH (ref 3.4–10.8)

## 2020-01-02 LAB — LIPID PANEL
Chol/HDL Ratio: 2.7 ratio (ref 0.0–4.4)
Cholesterol, Total: 164 mg/dL (ref 100–199)
HDL: 60 mg/dL (ref >39)
LDL Chol Calc (NIH): 72 mg/dL (ref 0–99)
Triglycerides: 194 mg/dL — ABNORMAL HIGH (ref 0–149)
VLDL Cholesterol Cal: 32 mg/dL (ref 5–40)

## 2020-01-02 LAB — BAYER DCA HB A1C WAIVED: HB A1C (BAYER DCA - WAIVED): 7.1 % — ABNORMAL HIGH (ref <7.0)

## 2020-01-02 MED ORDER — RYBELSUS 3 MG PO TABS: 3.0000 mg | ORAL_TABLET | Freq: Every day | ORAL | 0 refills | Status: DC

## 2020-01-02 MED ORDER — METFORMIN HCL 500 MG PO TABS: 1000.0000 mg | ORAL_TABLET | Freq: Two times a day (BID) | ORAL | 1 refills | Status: DC

## 2020-01-02 MED ORDER — RYBELSUS 7 MG PO TABS: 7.0000 mg | ORAL_TABLET | Freq: Every day | ORAL | 3 refills | Status: DC

## 2020-01-02 NOTE — Patient Instructions (Signed)

## 2020-01-02 NOTE — Progress Notes (Signed)
Assessment & Plan:  1. Diabetes mellitus without complication ([place]) Lab Results  Component Value Date   HGBA1C 7.1 (H) 01/02/2020   HGBA1C 6.8 09/28/2019   HGBA1C 7.8 (H) 05/31/2019  - Diabetes is not at goal of A1c < 7. - Medications: continue metformin and glipizide; D/C Farxiga; start Rybelsus 3 mg QD x30 days after symptoms of vaginal itching and diarrhea subside - Home glucose monitoring: continue monitoring - Patient is currently taking a statin. Patient is taking an ACE-inhibitor/ARB.  - Last foot exam: 01/02/2020 - Last diabetic eye exam: 04/28/2019 - Urine Microalbumin/Creat Ratio: 01/02/2020 - Instruction/counseling given: discussed foot care, discussed diet and provided printed educational material - Bayer DCA Hb A1c Waived - Microalbumin / creatinine urine ratio - metFORMIN (GLUCOPHAGE) 500 MG tablet; Take 2 tablets (1,000 mg total) by mouth 2 (two) times daily.  Dispense: 360 tablet; Refill: 1 - CBC with Differential/Platelet - CMP14+EGFR - Lipid panel  2. Essential hypertension - Well controlled on current regimen.  - CBC with Differential/Platelet - CMP14+EGFR - Lipid panel  3. Hypercholesteremia - Well controlled on current regimen.  - CMP14+EGFR - Lipid panel  4. Vaginal itching - Patient has a dose of Diflucan at home to take.    Return in about 4 weeks (around 01/30/2020) for with Almyra Free for DM; then 3 months f/u with me.  Hendricks Limes, MSN, APRN, FNP-C [facility]  Subjective:    Patient ID: Jeanne Medina, female    DOB: 1951-02-13, 69 y.o.   MRN: [id number]  Patient Care Team: Loman Brooklyn, FNP as PCP - General (Family Medicine) Lavera Guise, [facility] (Pharmacist)   Chief Complaint:  Chief Complaint  Patient presents with  . Diabetes    3 month follow up  . Vaginal Itching    Patient states this started when she started [place]  . Diarrhea    Patient states it started when she started [place].    HPI: Jeanne Medina is a 69 y.o. female presenting on 01/02/2020 for Diabetes (3 month follow up), Vaginal Itching (Patient states this started when she started [place]), and Diarrhea (Patient states it started when she started [place].)  Diabetes: Patient presents for follow up of diabetes. Current symptoms include: itching and loose stools. Symptoms have started since starting Farxiga. Known diabetic complications: none. Medication compliance: yes. Current diet: in general, a "healthy" diet  . Current exercise: working out. Is she  on ACE inhibitor or angiotensin II receptor blocker? Yes. Is she on a statin? Yes.   Lab Results  Component Value Date   HGBA1C 7.1 (H) 01/02/2020   HGBA1C 6.8 09/28/2019   HGBA1C 7.8 (H) 05/31/2019   Lab Results  Component Value Date   LDLCALC 71 11/04/2018   CREATININE 1.26 (H) 09/28/2019     New complaints: None  Social history:  Relevant past medical, surgical, family and social history reviewed and updated as indicated. Interim medical history since our last visit reviewed.  Allergies and medications reviewed and updated.  DATA REVIEWED: CHART IN EPIC  ROS: Negative unless specifically indicated above in HPI.    Current Outpatient Medications:  .  amLODipine (NORVASC) 10 MG tablet, Take 1 tablet (10 mg total) by mouth daily., Disp: 90 tablet, Rfl: 3 .  citalopram (CELEXA) 40 MG tablet, Take 1 tablet (40 mg total) by mouth daily., Disp: 90 tablet, Rfl: 3 .  dapagliflozin propanediol (FARXIGA) 10 MG TABS tablet, Take 1 tablet (10 mg total) by mouth daily. (  Patient taking differently: Take 5 mg by mouth daily. ), Disp: 30 tablet, Rfl: 3 .  diphenhydrAMINE (ALLERGY RELIEF) 25 MG tablet, Take 25 mg by mouth at bedtime as needed (for allergies.)., Disp: , Rfl:  .  fluticasone (FLONASE) 50 MCG/ACT nasal spray, Place 1 spray into both nostrils 2 (two) times daily., Disp: 16 g, Rfl: 5 .  glipiZIDE (GLUCOTROL) 10 MG tablet, Take 1 tablet (10 mg total) by mouth 2 (two)  times daily before a meal. (Patient taking differently: Take 5 mg by mouth daily before breakfast. ), Disp: 180 tablet, Rfl: 3 .  L-Lysine 500 MG CAPS, Take by mouth., Disp: , Rfl:  .  lisinopril-hydrochlorothiazide (ZESTORETIC) 20-25 MG tablet, Take 1 tablet by mouth daily., Disp: 90 tablet, Rfl: 3 .  loratadine (CLARITIN) 10 MG tablet, Take 1 tablet (10 mg total) by mouth daily., Disp: 90 tablet, Rfl: 3 .  lovastatin (MEVACOR) 40 MG tablet, Take 1 tablet (40 mg total) by mouth at bedtime., Disp: 90 tablet, Rfl: 3 .  meclizine (ANTIVERT) 12.5 MG tablet, Take 1 tablet (12.5 mg total) by mouth 3 (three) times daily as needed for dizziness., Disp: 30 tablet, Rfl: 0 .  metFORMIN (GLUCOPHAGE) 500 MG tablet, Take 2 tablets (1,000 mg total) by mouth 2 (two) times daily., Disp: 360 tablet, Rfl: 1 .  traZODone (DESYREL) 50 MG tablet, Take 1 tablet (50 mg total) by mouth at bedtime as needed. for sleep, Disp: 90 tablet, Rfl: 3   Allergies  Allergen Reactions  . Sulfa Antibiotics Hives and Itching    welts   Past Medical History:  Diagnosis Date  . Anxiety   . Arthritis    osteoarthritis  . Asthma   . Diabetes mellitus without complication ([place])   . Hypercholesteremia   . Hypertension     Past Surgical History:  Procedure Laterality Date  . CATARACT EXTRACTION W/PHACO Left 08/18/2016   Procedure: CATARACT EXTRACTION PHACO AND INTRAOCULAR LENS PLACEMENT (IOC);  Surgeon: Rutherford Guys, MD;  Location: [facility];  Service: Ophthalmology;  Laterality: Left;  CDE: 6.05  . CATARACT EXTRACTION W/PHACO Left 09/01/2016   Procedure: REPOSITIONING OF LEFT IOL LENS;  Surgeon: Rutherford Guys, MD;  Location: [facility];  Service: Ophthalmology;  Laterality: Left;  . CATARACT EXTRACTION W/PHACO Right 10/06/2016   Procedure: CATARACT EXTRACTION PHACO AND INTRAOCULAR LENS PLACEMENT (IOC);  Surgeon: Rutherford Guys, MD;  Location: [facility];  Service: Ophthalmology;  Laterality: Right;  CDE: 6.21    Social History    Socioeconomic History  . Marital status: Married    Spouse name: Not on file  . Number of children: Not on file  . Years of education: Not on file  . Highest education level: Not on file  Occupational History  . Not on file  Tobacco Use  . Smoking status: Former Smoker    Packs/day: 0.25    Years: 20.00    Pack years: 5.00    Types: Cigarettes    Quit date: 08/13/1980    Years since quitting: 39.4  . Smokeless tobacco: Never Used  Substance and Sexual Activity  . Alcohol use: No  . Drug use: No  . Sexual activity: Yes    Birth control/protection: Post-menopausal  Other Topics Concern  . Not on file  Social History Narrative  . Not on file   Social Determinants of Health   Financial Resource Strain:   . Difficulty of Paying Living Expenses: Not on file  Food Insecurity:   . Worried About Running  Out of Food in the Last Year: Not on file  . Ran Out of Food in the Last Year: Not on file  Transportation Needs:   . Lack of Transportation (Medical): Not on file  . Lack of Transportation (Non-Medical): Not on file  Physical Activity:   . Days of Exercise per Week: Not on file  . Minutes of Exercise per Session: Not on file  Stress:   . Feeling of Stress : Not on file  Social Connections:   . Frequency of Communication with Friends and Family: Not on file  . Frequency of Social Gatherings with Friends and Family: Not on file  . Attends Religious Services: Not on file  . Active Member of Clubs or Organizations: Not on file  . Attends Archivist Meetings: Not on file  . Marital Status: Not on file  Intimate Partner Violence:   . Fear of Current or Ex-Partner: Not on file  . Emotionally Abused: Not on file  . Physically Abused: Not on file  . Sexually Abused: Not on file        Objective:    BP (!) 146/85   Pulse 97   Temp (!) 96.7 F (35.9 C) (Temporal)   Ht _0  (1.6 m)   Wt 193 lb 6.4 oz (87.7 kg)   SpO2 95%   BMI 34.26 kg/m   Wt Readings  from Last 3 Encounters:  01/02/20 193 lb 6.4 oz (87.7 kg)  05/31/19 202 lb 12.8 oz (92 kg)  11/04/18 208 lb (94.3 kg)    Physical Exam Vitals reviewed.  Constitutional:      General: She is not in acute distress.    Appearance: Normal appearance. She is not ill-appearing, toxic-appearing or diaphoretic.  HENT:     Head: Normocephalic and atraumatic.  Eyes:     General: No scleral icterus.       Right eye: No discharge.        Left eye: No discharge.     Conjunctiva/sclera: Conjunctivae normal.  Cardiovascular:     Rate and Rhythm: Normal rate and regular rhythm.     Heart sounds: Normal heart sounds. No murmur heard.  No friction rub. No gallop.   Pulmonary:     Effort: Pulmonary effort is normal. No respiratory distress.     Breath sounds: Normal breath sounds. No stridor. No wheezing, rhonchi or rales.  Musculoskeletal:        General: Normal range of motion.     Cervical back: Normal range of motion.  Skin:    General: Skin is warm and dry.     Capillary Refill: Capillary refill takes less than 2 seconds.  Neurological:     General: No focal deficit present.     Mental Status: She is alert and oriented to person, place, and time. Mental status is at baseline.  Psychiatric:        Mood and Affect: Mood normal.        Behavior: Behavior normal.        Thought Content: Thought content normal.        Judgment: Judgment normal.    Diabetic Foot Exam - Simple   Simple Foot Form Diabetic Foot exam was performed with the following findings: Yes 01/02/2020  9:15 AM  Visual Inspection No deformities, no ulcerations, no other skin breakdown bilaterally: Yes Sensation Testing Intact to touch and monofilament testing bilaterally: Yes Pulse Check Posterior Tibialis and Dorsalis pulse intact bilaterally: Yes Comments  No results found for: TSH Lab Results  Component Value Date   WBC 9.2 11/04/2018   HGB 11.9 11/04/2018   HCT 36.8 11/04/2018   MCV 85 11/04/2018   PLT  521 (H) 11/04/2018   Lab Results  Component Value Date   NA 139 09/28/2019   K 4.8 09/28/2019   CO2 22 09/28/2019   GLUCOSE 161 (H) 09/28/2019   BUN 13 09/28/2019   CREATININE 1.26 (H) 09/28/2019   BILITOT 0.3 09/28/2019   ALKPHOS 89 09/28/2019   AST 9 09/28/2019   ALT 10 09/28/2019   PROT 6.7 09/28/2019   ALBUMIN 4.5 09/28/2019   CALCIUM 9.3 09/28/2019   ANIONGAP 12 08/13/2016   Lab Results  Component Value Date   CHOL 158 11/04/2018   Lab Results  Component Value Date   HDL 66 11/04/2018   Lab Results  Component Value Date   LDLCALC 71 11/04/2018   Lab Results  Component Value Date   TRIG 105 11/04/2018   Lab Results  Component Value Date   CHOLHDL 2.4 11/04/2018   Lab Results  Component Value Date   HGBA1C 6.8 09/28/2019

## 2020-01-02 NOTE — Telephone Encounter (Signed)
Patient continues to have itching with [place].  She also reports loose stools. Will allow 3-5 washout Patient to transition to Rybelsus 3mg  daily for 30 days. Then increase to 7mg  daily for therapeutic dose

## 2020-01-03 ENCOUNTER — Encounter: Payer: Self-pay | Admitting: Family Medicine

## 2020-01-03 LAB — MICROALBUMIN / CREATININE URINE RATIO
Creatinine, Urine: 88 mg/dL
Microalb/Creat Ratio: 14 mg/g creat (ref 0–29)
Microalbumin, Urine: 12 ug/mL

## 2020-01-29 ENCOUNTER — Other Ambulatory Visit: Payer: Self-pay

## 2020-01-29 ENCOUNTER — Ambulatory Visit (INDEPENDENT_AMBULATORY_CARE_PROVIDER_SITE_OTHER): Payer: Medicare Other | Admitting: Pharmacist

## 2020-01-29 DIAGNOSIS — E119 Type 2 diabetes mellitus without complications: Secondary | ICD-10-CM | POA: Diagnosis not present

## 2020-01-29 MED ORDER — RYBELSUS 7 MG PO TABS: 7.0000 mg | ORAL_TABLET | Freq: Every day | ORAL | 3 refills | Status: DC

## 2020-01-29 NOTE — Progress Notes (Signed)
    01/29/2020 Name: Jeanne Medina MRN: [id number] DOB: July 13, 1950   S:  33 yoF Presents for diabetes evaluation, education, and management Patient was referred and last seen by Primary Care Provider on 01/02/20.  Insurance coverage/medication affordability: BCBS medicare  Patient reports adherence with medications. . Current diabetes medications include: rybelsus, metformin, glipizide . Current hypertension medications include: amlodipine Goal 130/80 . Current hyperlipidemia medications include: lovastatin   Patient denies hypoglycemic events.   Patient reported dietary habits: Eats 2-3 meals/day The patient is asked to make an attempt to improve diet and exercise patterns to aid in medical management of this problem. Discussed meal planning options and Plate method for healthy eating . Avoid sugary drinks and desserts . Incorporate balanced protein, non starchy veggies, 1 serving of carbohydrate with each meal . Increase water intake . Increase physical activity as able  Patient-reported exercise habits: patient working out 3-4 times weekly   O:  Lab Results  Component Value Date   HGBA1C 7.1 (H) 01/02/2020    There were no vitals filed for this visit.     Lipid Panel     Component Value Date/Time   CHOL 164 01/02/2020 0930   TRIG 194 (H) 01/02/2020 0930   HDL 60 01/02/2020 0930   CHOLHDL 2.7 01/02/2020 0930   LDLCALC 72 01/02/2020 0930    Home fasting blood sugars: 119  2 hour post-meal/random blood sugars: no higher than 170   A/P:  Diabetes T2DM currently controlled.  Patient is adherent with medication.  Patient has recently tried and failed [place] due to yeast infections (tried for 30 days).  She has not had hypoglycemia since we have made medication adjustments.  -Continue Rybelsus 3mg  daily (GLP1) then increase to Rybelsus 7 mg daily  -3mg  sample given-LOT#[id number], EXP 5/23 (we do not stock 7mg  samples)   -will fax to health department when PCP  signs RX  -patient denies history of thyroid/medullary cancer  -she is tolerating rybelsus well, no side effects reported  -Continue metformin (GFR 45, will watch)  -Continue glipizide for now, will hope to wean off of this medication once GLP1 optimized  -Extensively discussed pathophysiology of diabetes, recommended lifestyle interventions, dietary effects on blood sugar control  -Counseled on s/sx of and management of hypoglycemia  -Next A1C anticipated 3 months .   Written patient instructions provided.  Total time in face to face counseling 27 minutes.   Follow up PCP Clinic Visit in January 2022.    6/23, PharmD, BCPS Clinical Pharmacist, [facility]  II Phone [phone]

## 2020-02-01 ENCOUNTER — Ambulatory Visit: Payer: Medicare Other | Admitting: Pharmacist

## 2020-02-05 ENCOUNTER — Other Ambulatory Visit: Payer: Self-pay | Admitting: *Deleted

## 2020-02-05 DIAGNOSIS — J0110 Acute frontal sinusitis, unspecified: Secondary | ICD-10-CM

## 2020-02-05 MED ORDER — FLUTICASONE PROPIONATE 50 MCG/ACT NA SUSP: 1.0000 | Freq: Two times a day (BID) | NASAL | 1 refills | Status: DC

## 2020-02-07 ENCOUNTER — Encounter: Payer: Self-pay | Admitting: Family Medicine

## 2020-02-20 ENCOUNTER — Telehealth: Payer: Self-pay

## 2020-02-20 NOTE — Telephone Encounter (Signed)
Patient aware and verbalized understanding. °

## 2020-02-20 NOTE — Telephone Encounter (Signed)
I faxed RX for Rybelsus to [employer] on 01/31/20.  I will fax again today.  Please have her follow up with Delsa Sale at the Health Dept [phone]  Thank you!

## 2020-04-08 ENCOUNTER — Encounter: Payer: Self-pay | Admitting: *Deleted

## 2020-04-11 LAB — FECAL OCCULT BLOOD, GUAIAC: Fecal Occult Blood: NEGATIVE

## 2020-04-19 ENCOUNTER — Encounter: Payer: Self-pay | Admitting: Family Medicine

## 2020-04-23 ENCOUNTER — Telehealth (INDEPENDENT_AMBULATORY_CARE_PROVIDER_SITE_OTHER): Payer: Medicare Other | Admitting: Family

## 2020-04-23 ENCOUNTER — Encounter: Payer: Self-pay | Admitting: Family

## 2020-04-23 DIAGNOSIS — U071 COVID-19: Secondary | ICD-10-CM | POA: Diagnosis not present

## 2020-04-23 MED ORDER — BENZONATATE 200 MG PO CAPS: 200.0000 mg | ORAL_CAPSULE | Freq: Three times a day (TID) | ORAL | 1 refills | Status: DC | PRN

## 2020-04-23 MED ORDER — DEXAMETHASONE 6 MG PO TABS: 6.0000 mg | ORAL_TABLET | Freq: Two times a day (BID) | ORAL | 0 refills | Status: DC

## 2020-04-23 NOTE — Progress Notes (Signed)
Virtual Visit via telephone Note Due to COVID-19 pandemic this visit was conducted virtually. This visit type was conducted due to national recommendations for restrictions regarding the COVID-19 Pandemic (e.g. social distancing, sheltering in place) in an effort to limit this patient's exposure and mitigate transmission in our community. All issues noted in this document were discussed and addressed.  A physical exam was not performed with this format.  I connected with Jeanne Medina on 04/23/20 at 1:26 pm  by telephone and verified that I am speaking with the correct person using two identifiers. Jeanne Medina is currently located at home and husband  is currently with her  during visit. The provider, Jannifer Rodney, FNP is located in their office at time of visit.  I discussed the limitations, risks, security and privacy concerns of performing an evaluation and management service by telephone and the availability of in person appointments. I also discussed with the patient that there may be a patient responsible charge related to this service. The patient expressed understanding and agreed to proceed.   History and Present Illness:  Pt calls the office today with cough and positive COVID. Her symptoms started on 04/16/20 and tested positive for COVID yesterday. She is unvaccinated.  Cough This is a new problem. The current episode started in the past 7 days (04/16/20). The problem has been gradually worsening. The problem occurs every few minutes. The cough is productive of purulent sputum. Associated symptoms include headaches, myalgias, nasal congestion and postnasal drip. Pertinent negatives include no chills, ear congestion, ear pain, fever, sore throat, shortness of breath or wheezing. She has tried OTC cough suppressant for the symptoms. The treatment provided mild relief.     Review of Systems  Constitutional: Negative for chills and fever.  HENT: Positive for postnasal drip.  Negative for ear pain and sore throat.   Respiratory: Positive for cough. Negative for shortness of breath and wheezing.   Musculoskeletal: Positive for myalgias.  Neurological: Positive for headaches.  All other systems reviewed and are negative.    Observations/Objective: No SOB or distress noted   Assessment and Plan: 1. COVID-19 virus detected Rest Force fluids  Quarantine  Does not meet criteria for MAB given her symptoms have been at 7 days Call if symptoms worsen or do not improve   - MyChart COVID-19 home monitoring program; Future - dexamethasone (DECADRON) 6 MG tablet; Take 1 tablet (6 mg total) by mouth 2 (two) times daily.  Dispense: 14 tablet; Refill: 0 - benzonatate (TESSALON) 200 MG capsule; Take 1 capsule (200 mg total) by mouth 3 (three) times daily as needed.  Dispense: 30 capsule; Refill: 1       I discussed the assessment and treatment plan with the patient. The patient was provided an opportunity to ask questions and all were answered. The patient agreed with the plan and demonstrated an understanding of the instructions.   The patient was advised to call back or seek an in-person evaluation if the symptoms worsen or if the condition fails to improve as anticipated.  The above assessment and management plan was discussed with the patient. The patient verbalized understanding of and has agreed to the management plan. Patient is aware to call the clinic if symptoms persist or worsen. Patient is aware when to return to the clinic for a follow-up visit. Patient educated on when it is appropriate to go to the emergency department.   Time call ended:  1:37 pm   I provided 11 minutes  of non-face-to-face time during this encounter.    Jannifer Rodney, FNP

## 2020-04-24 ENCOUNTER — Ambulatory Visit (HOSPITAL_COMMUNITY)
Admission: RE | Admit: 2020-04-24 | Discharge: 2020-04-24 | Disposition: A | Discharge status: Home / Self Care | Payer: Medicare Other | Source: Ambulatory Visit | Attending: Pulmonary Disease | Admitting: Pulmonary Disease

## 2020-04-24 ENCOUNTER — Other Ambulatory Visit: Payer: Self-pay | Admitting: Nurse Practitioner

## 2020-04-24 DIAGNOSIS — Z23 Encounter for immunization: Secondary | ICD-10-CM | POA: Diagnosis not present

## 2020-04-24 DIAGNOSIS — U071 COVID-19: Secondary | ICD-10-CM

## 2020-04-24 MED ORDER — EPINEPHRINE 0.3 MG/0.3ML IJ SOAJ: 0.3000 mg | Freq: Once | INTRAMUSCULAR | Status: DC | PRN

## 2020-04-24 MED ORDER — ALBUTEROL SULFATE HFA 108 (90 BASE) MCG/ACT IN AERS: 2.0000 | INHALATION_SPRAY | Freq: Once | RESPIRATORY_TRACT | Status: DC | PRN

## 2020-04-24 MED ORDER — SODIUM CHLORIDE 0.9 % IV SOLN: Freq: Once | INTRAVENOUS | Status: AC

## 2020-04-24 MED ORDER — DIPHENHYDRAMINE HCL 50 MG/ML IJ SOLN: 50.0000 mg | Freq: Once | INTRAMUSCULAR | Status: DC | PRN

## 2020-04-24 MED ORDER — SODIUM CHLORIDE 0.9 % IV SOLN: INTRAVENOUS | Status: DC | PRN

## 2020-04-24 MED ORDER — METHYLPREDNISOLONE SODIUM SUCC 125 MG IJ SOLR: 125.0000 mg | Freq: Once | INTRAMUSCULAR | Status: DC | PRN

## 2020-04-24 MED ORDER — FAMOTIDINE IN NACL 20-0.9 MG/50ML-% IV SOLN: 20.0000 mg | Freq: Once | INTRAVENOUS | Status: DC | PRN

## 2020-04-24 NOTE — Progress Notes (Signed)
  Diagnosis: COVID-19  Physician:dr wright  Procedure: Covid Infusion Clinic Med: casirivimab\imdevimab infusion - Provided patient with casirivimab\imdevimab fact sheet for patients, parents and caregivers prior to infusion.  Complications: No immediate complications noted.  Discharge: Discharged home   Lenore Moyano S Rudene Poulsen 04/24/2020  

## 2020-04-24 NOTE — Progress Notes (Signed)
Patient reviewed Fact Sheet for Patients, Parents, and Caregivers for Emergency Use Authorization (EUA) of Casi/Regen for the Treatment of Coronavirus. Patient also reviewed and is agreeable to the estimated cost of treatment. Patient is agreeable to proceed.    

## 2020-04-24 NOTE — Discharge Instructions (Signed)
10 Things You Can Do to Manage Your COVID-19 Symptoms at Home If you have possible or confirmed COVID-19: 1. Stay home from work and school. And stay away from other public places. If you must go out, avoid using any kind of public transportation, ridesharing, or taxis. 2. Monitor your symptoms carefully. If your symptoms get worse, call your healthcare provider immediately. 3. Get rest and stay hydrated. 4. If you have a medical appointment, call the healthcare provider ahead of time and tell them that you have or may have COVID-19. 5. For medical emergencies, call 911 and notify the dispatch personnel that you have or may have COVID-19. 6. Cover your cough and sneezes with a tissue or use the inside of your elbow. 7. Wash your hands often with soap and water for at least 20 seconds or clean your hands with an alcohol-based hand sanitizer that contains at least 60% alcohol. 8. As much as possible, stay in a specific room and away from other people in your home. Also, you should use a separate bathroom, if available. If you need to be around other people in or outside of the home, wear a mask. 9. Avoid sharing personal items with other people in your household, like dishes, towels, and bedding. 10. Clean all surfaces that are touched often, like counters, tabletops, and doorknobs. Use household cleaning sprays or wipes according to the label instructions. cdc.gov/coronavirus 10/26/2018 This information is not intended to replace advice given to you by your health care provider. Make sure you discuss any questions you have with your health care provider. Document Revised: 03/30/2019 Document Reviewed: 03/30/2019 Elsevier Patient Education  2020 Elsevier Inc. What types of side effects do monoclonal antibody drugs cause?  Common side effects  In general, the more common side effects caused by monoclonal antibody drugs include: . Allergic reactions, such as hives or itching . Flu-like signs and  symptoms, including chills, fatigue, fever, and muscle aches and pains . Nausea, vomiting . Diarrhea . Skin rashes . Low blood pressure   The CDC is recommending patients who receive monoclonal antibody treatments wait at least 90 days before being vaccinated.  Currently, there are no data on the safety and efficacy of mRNA COVID-19 vaccines in persons who received monoclonal antibodies or convalescent plasma as part of COVID-19 treatment. Based on the estimated half-life of such therapies as well as evidence suggesting that reinfection is uncommon in the 90 days after initial infection, vaccination should be deferred for at least 90 days, as a precautionary measure until additional information becomes available, to avoid interference of the antibody treatment with vaccine-induced immune responses. If you have any questions or concerns after the infusion please call the Advanced Practice Provider on call at [phone]. This number is ONLY intended for your use regarding questions or concerns about the infusion post-treatment side-effects.  Please do not provide this number to others for use. For return to work notes please contact your primary care provider.   If someone you know is interested in receiving treatment please have them call the COVID hotline at [phone].   

## 2020-04-24 NOTE — Progress Notes (Signed)
I connected by phone with patient to discuss the potential use of a new treatment for mild to moderate COVID-19 viral infection in non-hospitalized patients.   This patient is a that meets the FDA criteria for Emergency Use Authorization of COVID monoclonal antibody  1. Has a (+) direct SARS-CoV-2 viral test result- threw away home test 2. Has mild or moderate COVID-19  3. Is NOT hospitalized due to COVID-19 4. Is within 7 days of symptom onset 5. Has at least one of the high risk factor(s) for progression to severe COVID-19 and/or hospitalization as defined in EUA. ? Specific high risk criteria : Diabetes, hypertension, obesity     I have spoken and communicated the following to the patient or parent/caregiver regarding COVID monoclonal antibody treatment:   1. FDA has authorized the emergency use for the treatment of high risk post-exposure prophylaxis for COVID19.    2. The significant known and potential risks and benefits of COVID monoclonal antibody, and the extent to which such potential risks and benefits are unknown.    3. Patients treated with COVID monoclonal antibody should continue to      self-isolate and use infection control measures (e.g., wear mask, isolate, social distance, avoid sharing personal items, clean and disinfect "high touch" surfaces, and frequent handwashing) according to CDC guidelines.       After reviewing this information with the patient, The patient agreed to proceed with receiving monoclonal antibody infusion and will be provided a copy of the Fact sheet prior to receiving the infusion. Ocie Bob, NP

## 2020-04-26 ENCOUNTER — Encounter (INDEPENDENT_AMBULATORY_CARE_PROVIDER_SITE_OTHER): Payer: Self-pay

## 2020-04-30 ENCOUNTER — Ambulatory Visit: Payer: Medicare Other | Admitting: Family Medicine

## 2020-05-01 ENCOUNTER — Ambulatory Visit: Payer: Medicare Other | Admitting: Family Medicine

## 2020-05-03 ENCOUNTER — Encounter (INDEPENDENT_AMBULATORY_CARE_PROVIDER_SITE_OTHER): Payer: Self-pay

## 2020-05-16 ENCOUNTER — Ambulatory Visit (INDEPENDENT_AMBULATORY_CARE_PROVIDER_SITE_OTHER): Payer: Medicare Other | Admitting: Family Medicine

## 2020-05-16 ENCOUNTER — Encounter: Payer: Self-pay | Admitting: Family Medicine

## 2020-05-16 VITALS — BP 133/77 | HR 95 | Temp 98.1°F | Ht 63.0 in | Wt 184.2 lb

## 2020-05-16 DIAGNOSIS — F419 Anxiety disorder, unspecified: Secondary | ICD-10-CM

## 2020-05-16 DIAGNOSIS — I10 Essential (primary) hypertension: Secondary | ICD-10-CM | POA: Diagnosis not present

## 2020-05-16 DIAGNOSIS — J4520 Mild intermittent asthma, uncomplicated: Secondary | ICD-10-CM

## 2020-05-16 DIAGNOSIS — E119 Type 2 diabetes mellitus without complications: Secondary | ICD-10-CM | POA: Diagnosis not present

## 2020-05-16 DIAGNOSIS — E7800 Pure hypercholesterolemia, unspecified: Secondary | ICD-10-CM | POA: Diagnosis not present

## 2020-05-16 DIAGNOSIS — E1165 Type 2 diabetes mellitus with hyperglycemia: Secondary | ICD-10-CM

## 2020-05-16 DIAGNOSIS — Z0000 Encounter for general adult medical examination without abnormal findings: Secondary | ICD-10-CM

## 2020-05-16 DIAGNOSIS — G479 Sleep disorder, unspecified: Secondary | ICD-10-CM | POA: Insufficient documentation

## 2020-05-16 DIAGNOSIS — Z8616 Personal history of COVID-19: Secondary | ICD-10-CM | POA: Insufficient documentation

## 2020-05-16 LAB — BAYER DCA HB A1C WAIVED: HB A1C (BAYER DCA - WAIVED): 7.7 % — ABNORMAL HIGH (ref <7.0)

## 2020-05-16 MED ORDER — AMLODIPINE BESYLATE 10 MG PO TABS: 10.0000 mg | ORAL_TABLET | Freq: Every day | ORAL | 1 refills | Status: DC

## 2020-05-16 MED ORDER — LISINOPRIL-HYDROCHLOROTHIAZIDE 20-25 MG PO TABS: 1.0000 | ORAL_TABLET | Freq: Every day | ORAL | 1 refills | Status: DC

## 2020-05-16 MED ORDER — GLIPIZIDE 10 MG PO TABS: 5.0000 mg | ORAL_TABLET | Freq: Two times a day (BID) | ORAL | 1 refills | Status: DC

## 2020-05-16 MED ORDER — METFORMIN HCL 500 MG PO TABS: 1000.0000 mg | ORAL_TABLET | Freq: Two times a day (BID) | ORAL | 1 refills | Status: DC

## 2020-05-16 MED ORDER — TRAZODONE HCL 50 MG PO TABS: 50.0000 mg | ORAL_TABLET | Freq: Every evening | ORAL | 1 refills | Status: DC | PRN

## 2020-05-16 MED ORDER — TRIAMCINOLONE ACETONIDE 0.1 % EX CREA: 1.0000 "application " | TOPICAL_CREAM | Freq: Two times a day (BID) | CUTANEOUS | 2 refills | Status: DC

## 2020-05-16 MED ORDER — ALBUTEROL SULFATE HFA 108 (90 BASE) MCG/ACT IN AERS: 2.0000 | INHALATION_SPRAY | Freq: Four times a day (QID) | RESPIRATORY_TRACT | 2 refills | Status: DC | PRN

## 2020-05-16 MED ORDER — LOVASTATIN 40 MG PO TABS: 40.0000 mg | ORAL_TABLET | Freq: Every day | ORAL | 1 refills | Status: DC

## 2020-05-16 MED ORDER — CITALOPRAM HYDROBROMIDE 40 MG PO TABS
40.0000 mg | ORAL_TABLET | Freq: Every day | ORAL | 1 refills | Status: DC
Start: 2020-05-16 — End: 2020-07-22

## 2020-05-16 MED ORDER — RYBELSUS 14 MG PO TABS: 14.0000 mg | ORAL_TABLET | Freq: Every day | ORAL | 3 refills | Status: DC

## 2020-05-16 NOTE — Addendum Note (Signed)
Addended by: Gwenlyn Fudge on: 05/16/2020 01:26 PM   Modules accepted: Orders

## 2020-05-16 NOTE — Patient Instructions (Signed)
Reminder: Please schedule your eye exam and have them fax [place] results.   Increase Rybelsus to 14 mg once daily. I will get a new prescription sent over to the health department for you. Decrease Glipizide back to 5 mg twice daily. If you start having lows just let me know and we will adjust.  Send me a picture of the test you did for colorectal cancer screening.  Send me a picture of the cream you are using that you need a refill on.

## 2020-05-16 NOTE — Progress Notes (Signed)
 Assessment & Plan:  1. Type 2 diabetes mellitus with hyperglycemia, without long-term current use of insulin (HCC) Lab Results  Component Value Date   HGBA1C 7.7 (H) 05/16/2020   HGBA1C 7.1 (H) 01/02/2020   HGBA1C 6.8 09/28/2019   - Diabetes is not at goal of A1c < 7. - Medications: Continue metformin at current dosage, increase Rybelsus to 14 mg once daily, decrease glipizide back down to 5 mg twice daily. I called over to health department where patient gets Rybelsus filled to find out what I need to do to get them the new prescription. VM was left for Debra Sweeney.  - Home glucose monitoring: Continue monitoring - Patient is currently taking a statin. Patient is taking an ACE-inhibitor/ARB.  - Last foot exam: 01/02/2020 - Last diabetic eye exam: 04/28/2019 - patient missed her recent eye exam due to COVID-19 but will reschedule. - Urine Microalbumin/Creat Ratio: 01/02/2020 - Instruction/counseling given: reminded to get eye exam - Lipid panel - CBC with Differential/Platelet - CMP14+EGFR - Bayer DCA Hb A1c Waived - glipiZIDE (GLUCOTROL) 10 MG tablet; Take 0.5 tablets (5 mg total) by mouth 2 (two) times daily before a meal.  Dispense: 90 tablet; Refill: 1 - metFORMIN (GLUCOPHAGE) 500 MG tablet; Take 2 tablets (1,000 mg total) by mouth 2 (two) times daily.  Dispense: 360 tablet; Refill: 1 - lovastatin (MEVACOR) 40 MG tablet; Take 1 tablet (40 mg total) by mouth at bedtime.  Dispense: 90 tablet; Refill: 1  2. Essential hypertension - Well controlled on current regimen.  - Lipid panel - CBC with Differential/Platelet - CMP14+EGFR - amLODipine (NORVASC) 10 MG tablet; Take 1 tablet (10 mg total) by mouth daily.  Dispense: 90 tablet; Refill: 1 - lisinopril-hydrochlorothiazide (ZESTORETIC) 20-25 MG tablet; Take 1 tablet by mouth daily.  Dispense: 90 tablet; Refill: 1  3. Hypercholesteremia - Labs to assess. - Lipid panel - CMP14+EGFR - lovastatin (MEVACOR) 40 MG tablet; Take 1 tablet  (40 mg total) by mouth at bedtime.  Dispense: 90 tablet; Refill: 1  4. Anxiety - Well controlled on current regimen.  - CMP14+EGFR - citalopram (CELEXA) 40 MG tablet; Take 1 tablet (40 mg total) by mouth daily.  Dispense: 90 tablet; Refill: 1  5. Difficulty sleeping - Well controlled on current regimen.  - CMP14+EGFR - traZODone (DESYREL) 50 MG tablet; Take 1 tablet (50 mg total) by mouth at bedtime as needed. for sleep  Dispense: 90 tablet; Refill: 1  6. Mild intermittent asthma without complication - Uncontrolled.  Albuterol inhaler prescribed today. - albuterol (VENTOLIN HFA) 108 (90 Base) MCG/ACT inhaler; Inhale 2 puffs into the lungs every 6 (six) hours as needed for wheezing or shortness of breath.  Dispense: 18 g; Refill: 2  7. History of COVID-19 - Patient reports her only remaining symptom is weakness.  8. Healthcare maintenance - Patient reports she did a screening test for colorectal cancer but has the results at home that she will send to me via MyChart.   Return in about 3 months (around 08/14/2020) for DM.  Britney Joyce, MSN, APRN, FNP-C [facility]  Subjective:    Patient ID: Jeanne Medina, female    DOB: 05/03/1950, 70 y.o.   MRN: [id number]  Patient Care Team: Joyce, Britney F, FNP as PCP - General (Family Medicine) Pruitt, Julie D, RPH (Pharmacist)   Chief Complaint:  Chief Complaint  Patient presents with  . Diabetes    3 month follow up of chronic medical conditions       HPI: Jeanne Medina is a 70 y.o. female presenting on 05/16/2020 for Diabetes (71 year follow up of chronic medical conditions/) (3 month follow up of chronic medical conditions/)  Diabetes: Patient presents for follow up of diabetes. Current symptoms include: hyperglycemia. Known diabetic complications: none. Medication compliance: yes. Current diet: in general, a "healthy" diet  . Current exercise: none. Is she  on ACE inhibitor or angiotensin II receptor blocker? Yes. Is she on a statin? Yes.  Patient reports  her fasting blood sugars were doing well (<140) prior to having COVID-19.  When she had COVID she was given steroids and got the monoclonal antibody infusion.  She reports with all of this going on her sugars started spiking in the 3 and 400s.  It was at that time that she increased the glipizide on her own from 5 mg twice daily up to 10 mg twice daily.  She has been doing that for the past 2 weeks and reports her blood sugars fasting have been less than 140.  Lab Results  Component Value Date   HGBA1C 7.7 (H) 05/16/2020   HGBA1C 7.1 (H) 01/02/2020   HGBA1C 6.8 09/28/2019   Lab Results  Component Value Date   LDLCALC 72 01/02/2020   CREATININE 1.23 (H) 01/02/2020     Asthma: Patient is in need of an albuterol inhaler.  New complaints: None  Social history:  Relevant past medical, surgical, family and social history reviewed and updated as indicated. Interim medical history since our last visit reviewed.  Allergies and medications reviewed and updated.  DATA REVIEWED: CHART IN EPIC  ROS: Negative unless specifically indicated above in HPI.    Current Outpatient Medications:  .  amLODipine (NORVASC) 10 MG tablet, Take 1 tablet (10 mg total) by mouth daily., Disp: 90 tablet, Rfl: 3 .  citalopram (CELEXA) 40 MG tablet, Take 1 tablet (40 mg total) by mouth daily., Disp: 90 tablet, Rfl: 3 .  diphenhydrAMINE (BENADRYL) 25 MG tablet, Take 25 mg by mouth at bedtime as needed (for allergies.)., Disp: , Rfl:  .  fluticasone (FLONASE) 50 MCG/ACT nasal spray, Place 1 spray into both nostrils 2 (two) times daily., Disp: 48 g, Rfl: 1 .  glipiZIDE (GLUCOTROL) 10 MG tablet, Take 1 tablet (10 mg total) by mouth 2 (two) times daily before a meal. (Patient taking differently: Take 5 mg by mouth 2 (two) times daily before a meal.), Disp: 180 tablet, Rfl: 3 .  L-Lysine 500 MG CAPS, Take by mouth., Disp: , Rfl:  .  lisinopril-hydrochlorothiazide (ZESTORETIC) 20-25 MG tablet, Take 1 tablet by mouth  daily., Disp: 90 tablet, Rfl: 3 .  loratadine (CLARITIN) 10 MG tablet, Take 1 tablet (10 mg total) by mouth daily., Disp: 90 tablet, Rfl: 3 .  lovastatin (MEVACOR) 40 MG tablet, Take 1 tablet (40 mg total) by mouth at bedtime., Disp: 90 tablet, Rfl: 3 .  metFORMIN (GLUCOPHAGE) 500 MG tablet, Take 2 tablets (1,000 mg total) by mouth 2 (two) times daily., Disp: 360 tablet, Rfl: 1 .  Semaglutide (RYBELSUS) 7 MG TABS, Take 7 mg by mouth daily., Disp: 90 tablet, Rfl: 3 .  traZODone (DESYREL) 50 MG tablet, Take 1 tablet (50 mg total) by mouth at bedtime as needed. for sleep, Disp: 90 tablet, Rfl: 3   Allergies  Allergen Reactions  . Farxiga [Dapagliflozin]     Yeast infections  . Sulfa Antibiotics Hives and Itching    welts   Past Medical History:  Diagnosis Date  . Anxiety   . Arthritis    osteoarthritis  .   Asthma   . Diabetes mellitus without complication ([place])   . Hypercholesteremia   . Hypertension     Past Surgical History:  Procedure Laterality Date  . CATARACT EXTRACTION W/PHACO Left 08/18/2016   Procedure: CATARACT EXTRACTION PHACO AND INTRAOCULAR LENS PLACEMENT (IOC);  Surgeon: Rutherford Guys, MD;  Location: [facility];  Service: Ophthalmology;  Laterality: Left;  CDE: 6.05  . CATARACT EXTRACTION W/PHACO Left 09/01/2016   Procedure: REPOSITIONING OF LEFT IOL LENS;  Surgeon: Rutherford Guys, MD;  Location: [facility];  Service: Ophthalmology;  Laterality: Left;  . CATARACT EXTRACTION W/PHACO Right 10/06/2016   Procedure: CATARACT EXTRACTION PHACO AND INTRAOCULAR LENS PLACEMENT (IOC);  Surgeon: Rutherford Guys, MD;  Location: [facility];  Service: Ophthalmology;  Laterality: Right;  CDE: 6.21    Social History   Socioeconomic History  . Marital status: Married    Spouse name: Not on file  . Number of children: Not on file  . Years of education: Not on file  . Highest education level: Not on file  Occupational History  . Not on file  Tobacco Use  . Smoking status: Former Smoker    Packs/day:  0.25    Years: 20.00    Pack years: 5.00    Types: Cigarettes    Quit date: 08/13/1980    Years since quitting: 39.7  . Smokeless tobacco: Never Used  Substance and Sexual Activity  . Alcohol use: No  . Drug use: No  . Sexual activity: Yes    Birth control/protection: Post-menopausal  Other Topics Concern  . Not on file  Social History Narrative  . Not on file   Social Determinants of Health   Financial Resource Strain: Not on file  Food Insecurity: Not on file  Transportation Needs: Not on file  Physical Activity: Not on file  Stress: Not on file  Social Connections: Not on file  Intimate Partner Violence: Not on file        Objective:    BP 133/77   Pulse 95   Temp 98.1 F (36.7 C) (Temporal)   Ht 5' 3" (1.6 m)   Wt 184 lb 3.2 oz (83.6 kg)   SpO2 96%   BMI 32.63 kg/m   Wt Readings from Last 3 Encounters:  05/16/20 184 lb 3.2 oz (83.6 kg)  01/02/20 193 lb 6.4 oz (87.7 kg)  05/31/19 202 lb 12.8 oz (92 kg)    Physical Exam Vitals reviewed.  Constitutional:      General: She is not in acute distress.    Appearance: Normal appearance. She is obese. She is not ill-appearing, toxic-appearing or diaphoretic.  HENT:     Head: Normocephalic and atraumatic.  Eyes:     General: No scleral icterus.       Right eye: No discharge.        Left eye: No discharge.     Conjunctiva/sclera: Conjunctivae normal.  Cardiovascular:     Rate and Rhythm: Normal rate and regular rhythm.     Heart sounds: Normal heart sounds. No murmur heard. No friction rub. No gallop.   Pulmonary:     Effort: Pulmonary effort is normal. No respiratory distress.     Breath sounds: No stridor. Wheezing present. No rhonchi or rales.  Musculoskeletal:        General: Normal range of motion.     Cervical back: Normal range of motion.  Skin:    General: Skin is warm and dry.     Capillary Refill: Capillary refill takes less  than 2 seconds.  Neurological:     General: No focal deficit present.      Mental Status: She is alert and oriented to person, place, and time. Mental status is at baseline.  Psychiatric:        Mood and Affect: Mood normal.        Behavior: Behavior normal.        Thought Content: Thought content normal.        Judgment: Judgment normal.     No results found for: TSH Lab Results  Component Value Date   WBC 11.1 (H) 01/02/2020   HGB 12.6 01/02/2020   HCT 39.9 01/02/2020   MCV 86 01/02/2020   PLT 438 01/02/2020   Lab Results  Component Value Date   NA 138 01/02/2020   K 4.5 01/02/2020   CO2 23 01/02/2020   GLUCOSE 146 (H) 01/02/2020   BUN 14 01/02/2020   CREATININE 1.23 (H) 01/02/2020   BILITOT 0.4 01/02/2020   ALKPHOS 78 01/02/2020   AST 9 01/02/2020   ALT 11 01/02/2020   PROT 6.8 01/02/2020   ALBUMIN 4.3 01/02/2020   CALCIUM 10.0 01/02/2020   ANIONGAP 12 08/13/2016   Lab Results  Component Value Date   CHOL 164 01/02/2020   Lab Results  Component Value Date   HDL 60 01/02/2020   Lab Results  Component Value Date   LDLCALC 72 01/02/2020   Lab Results  Component Value Date   TRIG 194 (H) 01/02/2020   Lab Results  Component Value Date   CHOLHDL 2.7 01/02/2020   Lab Results  Component Value Date   HGBA1C 7.1 (H) 01/02/2020           

## 2020-05-17 LAB — CBC WITH DIFFERENTIAL/PLATELET
Basophils Absolute: 0 x10E3/uL (ref 0.0–0.2)
Basos: 0 %
EOS (ABSOLUTE): 0.2 x10E3/uL (ref 0.0–0.4)
Eos: 2 %
Hematocrit: 35.8 % (ref 34.0–46.6)
Hemoglobin: 11.7 g/dL (ref 11.1–15.9)
Immature Grans (Abs): 0 x10E3/uL (ref 0.0–0.1)
Immature Granulocytes: 0 %
Lymphocytes Absolute: 1.3 x10E3/uL (ref 0.7–3.1)
Lymphs: 18 %
MCH: 27.9 pg (ref 26.6–33.0)
MCHC: 32.7 g/dL (ref 31.5–35.7)
MCV: 85 fL (ref 79–97)
Monocytes Absolute: 0.6 x10E3/uL (ref 0.1–0.9)
Monocytes: 8 %
Neutrophils Absolute: 5 x10E3/uL (ref 1.4–7.0)
Neutrophils: 72 %
Platelets: 320 x10E3/uL (ref 150–450)
RBC: 4.2 x10E6/uL (ref 3.77–5.28)
RDW: 13.1 % (ref 11.7–15.4)
WBC: 7.1 x10E3/uL (ref 3.4–10.8)

## 2020-05-17 LAB — CMP14+EGFR
ALT: 9 IU/L (ref 0–32)
AST: 12 IU/L (ref 0–40)
Albumin/Globulin Ratio: 1.7 (ref 1.2–2.2)
Albumin: 4 g/dL (ref 3.8–4.8)
Alkaline Phosphatase: 82 IU/L (ref 44–121)
BUN/Creatinine Ratio: 10 — ABNORMAL LOW (ref 12–28)
BUN: 9 mg/dL (ref 8–27)
Bilirubin Total: 0.5 mg/dL (ref 0.0–1.2)
CO2: 23 mmol/L (ref 20–29)
Calcium: 9.3 mg/dL (ref 8.7–10.3)
Chloride: 100 mmol/L (ref 96–106)
Creatinine, Ser: 0.91 mg/dL (ref 0.57–1.00)
GFR calc Af Amer: 74 mL/min/{1.73_m2} (ref >59)
GFR calc non Af Amer: 64 mL/min/{1.73_m2} (ref >59)
Globulin, Total: 2.3 g/dL (ref 1.5–4.5)
Glucose: 117 mg/dL — ABNORMAL HIGH (ref 65–99)
Potassium: 4.3 mmol/L (ref 3.5–5.2)
Sodium: 139 mmol/L (ref 134–144)
Total Protein: 6.3 g/dL (ref 6.0–8.5)

## 2020-05-17 LAB — LIPID PANEL
Chol/HDL Ratio: 2.8 ratio (ref 0.0–4.4)
Cholesterol, Total: 163 mg/dL (ref 100–199)
HDL: 59 mg/dL (ref >39)
LDL Chol Calc (NIH): 79 mg/dL (ref 0–99)
Triglycerides: 147 mg/dL (ref 0–149)
VLDL Cholesterol Cal: 25 mg/dL (ref 5–40)

## 2020-06-03 ENCOUNTER — Encounter: Payer: Self-pay | Admitting: Family Medicine

## 2020-06-03 ENCOUNTER — Ambulatory Visit (INDEPENDENT_AMBULATORY_CARE_PROVIDER_SITE_OTHER): Payer: Medicare Other | Admitting: Nurse Practitioner

## 2020-06-03 ENCOUNTER — Encounter: Payer: Self-pay | Admitting: Nurse Practitioner

## 2020-06-03 DIAGNOSIS — R059 Cough, unspecified: Secondary | ICD-10-CM | POA: Insufficient documentation

## 2020-06-03 DIAGNOSIS — R0989 Other specified symptoms and signs involving the circulatory and respiratory systems: Secondary | ICD-10-CM | POA: Insufficient documentation

## 2020-06-03 MED ORDER — ADVAIR HFA 45-21 MCG/ACT IN AERO: 2.0000 | INHALATION_SPRAY | Freq: Two times a day (BID) | RESPIRATORY_TRACT | 12 refills | Status: DC

## 2020-06-03 MED ORDER — DM-GUAIFENESIN ER 30-600 MG PO TB12: 1.0000 | ORAL_TABLET | Freq: Two times a day (BID) | ORAL | 0 refills | Status: DC

## 2020-06-03 NOTE — Assessment & Plan Note (Signed)
Symptoms not well controlled.  Worsening respiratory symptoms congestion, difficulty breathing.  Guaifenesin ordered for cough and congestion, Advair started Follow-up with worsening or unresolved symptoms. Rx sent to pharmacy

## 2020-06-03 NOTE — Progress Notes (Signed)
   Virtual Visit via telephone Note Due to COVID-19 pandemic this visit was conducted virtually. This visit type was conducted due to national recommendations for restrictions regarding the COVID-19 Pandemic (e.g. social distancing, sheltering in place) in an effort to limit this patient's exposure and mitigate transmission in our community. All issues noted in this document were discussed and addressed.  A physical exam was not performed with this format.  I connected with Jeanne Medina on 06/03/20 at  2:51 PM by telephone and verified that I am speaking with the correct person using two identifiers. Jeanne Medina is currently located at home during visit. The provider, Daryll Drown, NP is located in their office at time of visit.  I discussed the limitations, risks, security and privacy concerns of performing an evaluation and management service by telephone and the availability of in person appointments. I also discussed with the patient that there may be a patient responsible charge related to this service. The patient expressed understanding and agreed to proceed.   History and Present Illness:  Cough This is a new problem. The current episode started in the past 7 days. The problem has been unchanged. The problem occurs constantly. The cough is productive of sputum. Associated symptoms include ear congestion and nasal congestion. Pertinent negatives include no chills, ear pain, fever or shortness of breath. Nothing aggravates the symptoms. She has tried nothing for the symptoms. The treatment provided moderate relief.      Review of Systems  Constitutional: Negative for chills and fever.  HENT: Negative for ear pain.   Respiratory: Negative for cough and shortness of breath.   Cardiovascular: Negative.   Gastrointestinal: Negative for nausea and vomiting.  All other systems reviewed and are negative.    Observations/Objective: Televisit. Patient does sound to be in  distress.  Assessment and Plan: Chest congestion Symptoms not well controlled.  Worsening respiratory symptoms congestion, difficulty breathing.  Guaifenesin ordered for cough and congestion, Advair started Follow-up with worsening or unresolved symptoms. Rx sent to pharmacy      Follow Up Instructions: Follow-up with worsening symptoms    I discussed the assessment and treatment plan with the patient. The patient was provided an opportunity to ask questions and all were answered. The patient agreed with the plan and demonstrated an understanding of the instructions.   The patient was advised to call back or seek an in-person evaluation if the symptoms worsen or if the condition fails to improve as anticipated.  The above assessment and management plan was discussed with the patient. The patient verbalized understanding of and has agreed to the management plan. Patient is aware to call the clinic if symptoms persist or worsen. Patient is aware when to return to the clinic for a follow-up visit. Patient educated on when it is appropriate to go to the emergency department.   Time call ended: 3 PM  I provided 9 minutes of non-face-to-face time during this encounter.    Daryll Drown, NP

## 2020-06-03 NOTE — Patient Instructions (Signed)

## 2020-06-06 ENCOUNTER — Encounter: Payer: Self-pay | Admitting: Family Medicine

## 2020-06-10 ENCOUNTER — Ambulatory Visit: Payer: Medicare Other | Admitting: Nurse Practitioner

## 2020-06-23 ENCOUNTER — Encounter: Payer: Self-pay | Admitting: Family Medicine

## 2020-06-27 ENCOUNTER — Other Ambulatory Visit: Payer: Self-pay | Admitting: Family Medicine

## 2020-06-27 DIAGNOSIS — J302 Other seasonal allergic rhinitis: Secondary | ICD-10-CM

## 2020-07-22 ENCOUNTER — Other Ambulatory Visit: Payer: Self-pay | Admitting: Family Medicine

## 2020-07-22 DIAGNOSIS — F419 Anxiety disorder, unspecified: Secondary | ICD-10-CM

## 2020-07-30 LAB — HM DIABETES EYE EXAM

## 2020-08-06 ENCOUNTER — Telehealth: Payer: Self-pay | Admitting: *Deleted

## 2020-08-06 NOTE — Telephone Encounter (Signed)
Pt called  [facility] - Rybelsus 14 mg tab are here - there are 2 bottles of #30 and they are up front with her name and dob on them.

## 2020-08-15 ENCOUNTER — Encounter: Payer: Self-pay | Admitting: Family Medicine

## 2020-08-15 ENCOUNTER — Ambulatory Visit (INDEPENDENT_AMBULATORY_CARE_PROVIDER_SITE_OTHER): Payer: Medicare Other | Admitting: Family Medicine

## 2020-08-15 ENCOUNTER — Other Ambulatory Visit: Payer: Self-pay

## 2020-08-15 VITALS — BP 133/72 | HR 92 | Ht 63.0 in | Wt 179.0 lb

## 2020-08-15 DIAGNOSIS — F419 Anxiety disorder, unspecified: Secondary | ICD-10-CM

## 2020-08-15 DIAGNOSIS — I10 Essential (primary) hypertension: Secondary | ICD-10-CM

## 2020-08-15 DIAGNOSIS — E7800 Pure hypercholesterolemia, unspecified: Secondary | ICD-10-CM

## 2020-08-15 DIAGNOSIS — J4520 Mild intermittent asthma, uncomplicated: Secondary | ICD-10-CM

## 2020-08-15 DIAGNOSIS — E1165 Type 2 diabetes mellitus with hyperglycemia: Secondary | ICD-10-CM

## 2020-08-15 DIAGNOSIS — Z0000 Encounter for general adult medical examination without abnormal findings: Secondary | ICD-10-CM

## 2020-08-15 LAB — BAYER DCA HB A1C WAIVED: HB A1C (BAYER DCA - WAIVED): 6.4 % (ref <7.0)

## 2020-08-15 NOTE — Progress Notes (Signed)
Assessment & Plan:  1. Type 2 diabetes mellitus with hyperglycemia, without long-term current use of insulin (HCC) Lab Results  Component Value Date   HGBA1C 6.4 08/15/2020   HGBA1C 7.7 (H) 05/16/2020   HGBA1C 7.1 (H) 01/02/2020    - Diabetes is at goal of A1c < 7. - Medications: continue current medications - Home glucose monitoring: Continue monitoring - Patient is currently taking a statin. Patient is taking an ACE-inhibitor/ARB.  - Instruction/counseling given: discussed the need for weight loss  Diabetes Health Maintenance Due  Topic Date Due  . FOOT EXAM  01/01/2021  . HEMOGLOBIN A1C  02/14/2021  . OPHTHALMOLOGY EXAM  07/30/2021    Lab Results  Component Value Date   LABMICR 12.0 01/02/2020   LABMICR 40.0 11/04/2018   - CBC with Differential/Platelet - CMP14+EGFR - Lipid panel - Bayer DCA Hb A1c Waived  2. Essential hypertension Well controlled on current regimen.  - CBC with Differential/Platelet - CMP14+EGFR - Lipid panel - Bayer DCA Hb A1c Waived  3. Hypercholesteremia Labs to assess. - CBC with Differential/Platelet - CMP14+EGFR - Lipid panel - Bayer DCA Hb A1c Waived  4. Mild intermittent asthma without complication Well controlled on current regimen.   5. Anxiety Well controlled on current regimen.  - CMP14+EGFR  6. Healthcare maintenance Patient to schedule mammogram at the [facility] in [place].   Return in about 3 months (around 11/14/2020) for annual physical.  Hendricks Limes, MSN, APRN, FNP-C [facility]  Subjective:    Patient ID: Jeanne Medina, female    DOB: 1950/12/23, 70 y.o.   MRN: [id number]  Patient Care Team: Loman Brooklyn, FNP as PCP - General (Family Medicine) Lavera Guise, [facility] (Pharmacist)   Chief Complaint:  Chief Complaint  Patient presents with  . Medical Management of Chronic Issues  . Diabetes  . Hypertension  . Hyperlipidemia    HPI: Jeanne Medina is a 70 y.o. female  presenting on 08/15/2020 for Medical Management of Chronic Issues, Diabetes, Hypertension, and Hyperlipidemia  Diabetes: Patient presents for follow up of diabetes. Current symptoms include: none.  Patient has not had any more episodes of hypoglycemia.  Fasting blood sugar this morning was 115.  Known diabetic complications: none. Medication compliance: yes. Current diet: in general, a "healthy" diet  . Current exercise: is back at the gym.  She has lost 5 lbs since her last visit her our scales, but states it is 7 lbs per her scale at home.  Is she  on ACE inhibitor or angiotensin II receptor blocker? Yes. Is she on a statin? Yes.    Asthma: Patient was using Advair 2 puffs twice daily while she had bronchitis.  States she weaned herself off of that and is no longer using it. She is also no longer needing the Albuterol.   Anxiety: doing well with Celexa.  GAD 7 : Generalized Anxiety Score 08/15/2020 05/16/2020 05/31/2019  Nervous, Anxious, on Edge 0 0 1  Control/stop worrying 0 0 1  Worry too much - different things 0 1 0  Trouble relaxing 0 0 1  Restless 0 0 0  Easily annoyed or irritable 0 0 0  Afraid - awful might happen 0 0 0  Total GAD 7 Score 0 1 3  Anxiety Difficulty - Not difficult at all Not difficult at all   Depression screen [facility] 2/9 08/15/2020 08/15/2020 05/16/2020  Decreased Interest 1 1 0  Down, Depressed, Hopeless - 0 0  PHQ -  2 Score 1 1 0  Altered sleeping 1 - 1  Tired, decreased energy 1 - 1  Change in appetite 1 - 1  Feeling bad or failure about yourself  0 - 0  Trouble concentrating 0 - 0  Moving slowly or fidgety/restless 0 - 0  Suicidal thoughts 0 - 0  PHQ-9 Score 4 - 3  Difficult doing work/chores - - Not difficult at all    New complaints: None  Social history:  Relevant past medical, surgical, family and social history reviewed and updated as indicated. Interim medical history since our last visit reviewed.  Allergies and medications reviewed and  updated.  DATA REVIEWED: CHART IN EPIC  ROS: Negative unless specifically indicated above in HPI.    Current Outpatient Medications:  .  albuterol (VENTOLIN HFA) 108 (90 Base) MCG/ACT inhaler, Inhale 2 puffs into the lungs every 6 (six) hours as needed for wheezing or shortness of breath., Disp: 18 g, Rfl: 2 .  amLODipine (NORVASC) 10 MG tablet, Take 1 tablet (10 mg total) by mouth daily., Disp: 90 tablet, Rfl: 1 .  citalopram (CELEXA) 40 MG tablet, Take 1 tablet by mouth once daily, Disp: 90 tablet, Rfl: 0 .  diphenhydrAMINE (BENADRYL) 25 MG tablet, Take 25 mg by mouth at bedtime as needed (for allergies.)., Disp: , Rfl:  .  EQ LORATADINE 10 MG tablet, Take 1 tablet by mouth once daily, Disp: 90 tablet, Rfl: 2 .  fluticasone (FLONASE) 50 MCG/ACT nasal spray, Place 1 spray into both nostrils 2 (two) times daily., Disp: 48 g, Rfl: 1 .  fluticasone-salmeterol (ADVAIR HFA) 45-21 MCG/ACT inhaler, Inhale 2 puffs into the lungs 2 (two) times daily., Disp: 1 each, Rfl: 12 .  glipiZIDE (GLUCOTROL) 10 MG tablet, Take 0.5 tablets (5 mg total) by mouth 2 (two) times daily before a meal., Disp: 90 tablet, Rfl: 1 .  L-Lysine 500 MG CAPS, Take by mouth., Disp: , Rfl:  .  lisinopril-hydrochlorothiazide (ZESTORETIC) 20-25 MG tablet, Take 1 tablet by mouth daily., Disp: 90 tablet, Rfl: 1 .  lovastatin (MEVACOR) 40 MG tablet, Take 1 tablet (40 mg total) by mouth at bedtime., Disp: 90 tablet, Rfl: 1 .  metFORMIN (GLUCOPHAGE) 500 MG tablet, Take 2 tablets (1,000 mg total) by mouth 2 (two) times daily., Disp: 360 tablet, Rfl: 1 .  Semaglutide (RYBELSUS) 14 MG TABS, Take 14 mg by mouth daily., Disp: 90 tablet, Rfl: 3 .  traZODone (DESYREL) 50 MG tablet, Take 1 tablet (50 mg total) by mouth at bedtime as needed. for sleep, Disp: 90 tablet, Rfl: 1 .  triamcinolone (KENALOG) 0.1 %, Apply 1 application topically 2 (two) times daily., Disp: 80 g, Rfl: 2   Allergies  Allergen Reactions  . Farxiga [Dapagliflozin]      Yeast infections  . Sulfa Antibiotics Hives and Itching    welts   Past Medical History:  Diagnosis Date  . Anxiety   . Arthritis    osteoarthritis  . Asthma   . Diabetes mellitus without complication ([place])   . Hypercholesteremia   . Hypertension     Past Surgical History:  Procedure Laterality Date  . CATARACT EXTRACTION W/PHACO Left 08/18/2016   Procedure: CATARACT EXTRACTION PHACO AND INTRAOCULAR LENS PLACEMENT (IOC);  Surgeon: Rutherford Guys, MD;  Location: [facility];  Service: Ophthalmology;  Laterality: Left;  CDE: 6.05  . CATARACT EXTRACTION W/PHACO Left 09/01/2016   Procedure: REPOSITIONING OF LEFT IOL LENS;  Surgeon: Rutherford Guys, MD;  Location: [facility];  Service: Ophthalmology;  Laterality: Left;  . CATARACT EXTRACTION W/PHACO Right 10/06/2016   Procedure: CATARACT EXTRACTION PHACO AND INTRAOCULAR LENS PLACEMENT (IOC);  Surgeon: Rutherford Guys, MD;  Location: [facility];  Service: Ophthalmology;  Laterality: Right;  CDE: 6.21    Social History   Socioeconomic History  . Marital status: Married    Spouse name: Not on file  . Number of children: Not on file  . Years of education: Not on file  . Highest education level: Not on file  Occupational History  . Not on file  Tobacco Use  . Smoking status: Former Smoker    Packs/day: 0.25    Years: 20.00    Pack years: 5.00    Types: Cigarettes    Quit date: 08/13/1980    Years since quitting: 40.0  . Smokeless tobacco: Never Used  Substance and Sexual Activity  . Alcohol use: No  . Drug use: No  . Sexual activity: Yes    Birth control/protection: Post-menopausal  Other Topics Concern  . Not on file  Social History Narrative  . Not on file   Social Determinants of Health   Financial Resource Strain: Not on file  Food Insecurity: Not on file  Transportation Needs: Not on file  Physical Activity: Not on file  Stress: Not on file  Social Connections: Not on file  Intimate Partner Violence: Not on file        Objective:     BP 133/72   Pulse 92   Ht '5\' 3"'  (1.6 m)   Wt 179 lb (81.2 kg)   SpO2 94%   BMI 31.71 kg/m   Wt Readings from Last 3 Encounters:  08/15/20 179 lb (81.2 kg)  05/16/20 184 lb 3.2 oz (83.6 kg)  01/02/20 193 lb 6.4 oz (87.7 kg)    Physical Exam Vitals reviewed.  Constitutional:      General: She is not in acute distress.    Appearance: Normal appearance. She is obese. She is not ill-appearing, toxic-appearing or diaphoretic.  HENT:     Head: Normocephalic and atraumatic.  Eyes:     General: No scleral icterus.       Right eye: No discharge.        Left eye: No discharge.     Conjunctiva/sclera: Conjunctivae normal.  Cardiovascular:     Rate and Rhythm: Normal rate and regular rhythm.     Heart sounds: Normal heart sounds. No murmur heard. No friction rub. No gallop.   Pulmonary:     Effort: Pulmonary effort is normal. No respiratory distress.     Breath sounds: Normal breath sounds. No stridor. No wheezing, rhonchi or rales.  Musculoskeletal:        General: Normal range of motion.     Cervical back: Normal range of motion.  Skin:    General: Skin is warm and dry.     Capillary Refill: Capillary refill takes less than 2 seconds.  Neurological:     General: No focal deficit present.     Mental Status: She is alert and oriented to person, place, and time. Mental status is at baseline.  Psychiatric:        Mood and Affect: Mood normal.        Behavior: Behavior normal.        Thought Content: Thought content normal.        Judgment: Judgment normal.     No results found for: TSH Lab Results  Component Value Date   WBC 7.1 05/16/2020  HGB 11.7 05/16/2020   HCT 35.8 05/16/2020   MCV 85 05/16/2020   PLT 320 05/16/2020   Lab Results  Component Value Date   NA 139 05/16/2020   K 4.3 05/16/2020   CO2 23 05/16/2020   GLUCOSE 117 (H) 05/16/2020   BUN 9 05/16/2020   CREATININE 0.91 05/16/2020   BILITOT 0.5 05/16/2020   ALKPHOS 82 05/16/2020   AST 12  05/16/2020   ALT 9 05/16/2020   PROT 6.3 05/16/2020   ALBUMIN 4.0 05/16/2020   CALCIUM 9.3 05/16/2020   ANIONGAP 12 08/13/2016   Lab Results  Component Value Date   CHOL 163 05/16/2020   Lab Results  Component Value Date   HDL 59 05/16/2020   Lab Results  Component Value Date   LDLCALC 79 05/16/2020   Lab Results  Component Value Date   TRIG 147 05/16/2020   Lab Results  Component Value Date   CHOLHDL 2.8 05/16/2020   Lab Results  Component Value Date   HGBA1C 7.7 (H) 05/16/2020

## 2020-08-15 NOTE — Patient Instructions (Signed)
Please schedule your mammogram at the [facility].

## 2020-08-16 LAB — CBC WITH DIFFERENTIAL/PLATELET
Basophils Absolute: 0 10*3/uL (ref 0.0–0.2)
Basos: 0 %
EOS (ABSOLUTE): 0.5 10*3/uL — ABNORMAL HIGH (ref 0.0–0.4)
Eos: 4 %
Hematocrit: 37.7 % (ref 34.0–46.6)
Hemoglobin: 12.2 g/dL (ref 11.1–15.9)
Immature Grans (Abs): 0 10*3/uL (ref 0.0–0.1)
Immature Granulocytes: 0 %
Lymphocytes Absolute: 1.9 10*3/uL (ref 0.7–3.1)
Lymphs: 17 %
MCH: 26.7 pg (ref 26.6–33.0)
MCHC: 32.4 g/dL (ref 31.5–35.7)
MCV: 83 fL (ref 79–97)
Monocytes Absolute: 0.6 10*3/uL (ref 0.1–0.9)
Monocytes: 5 %
Neutrophils Absolute: 8.3 10*3/uL — ABNORMAL HIGH (ref 1.4–7.0)
Neutrophils: 74 %
Platelets: 421 10*3/uL (ref 150–450)
RBC: 4.57 x10E6/uL (ref 3.77–5.28)
RDW: 13.1 % (ref 11.7–15.4)
WBC: 11.3 10*3/uL — ABNORMAL HIGH (ref 3.4–10.8)

## 2020-08-16 LAB — CMP14+EGFR
ALT: 9 IU/L (ref 0–32)
AST: 11 IU/L (ref 0–40)
Albumin/Globulin Ratio: 2 (ref 1.2–2.2)
Albumin: 4.3 g/dL (ref 3.8–4.8)
Alkaline Phosphatase: 90 IU/L (ref 44–121)
BUN/Creatinine Ratio: 10 — ABNORMAL LOW (ref 12–28)
BUN: 10 mg/dL (ref 8–27)
Bilirubin Total: 0.4 mg/dL (ref 0.0–1.2)
CO2: 22 mmol/L (ref 20–29)
Calcium: 9.8 mg/dL (ref 8.7–10.3)
Chloride: 99 mmol/L (ref 96–106)
Creatinine, Ser: 0.98 mg/dL (ref 0.57–1.00)
Globulin, Total: 2.1 g/dL (ref 1.5–4.5)
Glucose: 102 mg/dL — ABNORMAL HIGH (ref 65–99)
Potassium: 4.6 mmol/L (ref 3.5–5.2)
Sodium: 140 mmol/L (ref 134–144)
Total Protein: 6.4 g/dL (ref 6.0–8.5)
eGFR: 62 mL/min/{1.73_m2} (ref >59)

## 2020-08-16 LAB — LIPID PANEL
Chol/HDL Ratio: 2.2 ratio (ref 0.0–4.4)
Cholesterol, Total: 147 mg/dL (ref 100–199)
HDL: 68 mg/dL (ref >39)
LDL Chol Calc (NIH): 58 mg/dL (ref 0–99)
Triglycerides: 117 mg/dL (ref 0–149)
VLDL Cholesterol Cal: 21 mg/dL (ref 5–40)

## 2020-10-03 ENCOUNTER — Ambulatory Visit (INDEPENDENT_AMBULATORY_CARE_PROVIDER_SITE_OTHER): Payer: Medicare Other

## 2020-10-03 VITALS — Ht 63.0 in | Wt 174.0 lb

## 2020-10-03 DIAGNOSIS — Z0000 Encounter for general adult medical examination without abnormal findings: Secondary | ICD-10-CM

## 2020-10-03 NOTE — Patient Instructions (Signed)
Jeanne Medina , Thank you for taking time to come for your Medicare Wellness Visit. I appreciate your ongoing commitment to your health goals. Please review the following plan we discussed and let me know if I can assist you in the future.   Screening recommendations/referrals: Colonoscopy: FOBT done 03/2020 - Repeat annually Mammogram: Keep appointment 11/30/20 - Repeat every year Bone Density: Due *every 2 years Recommended yearly ophthalmology/optometry visit for glaucoma screening and checkup Recommended yearly dental visit for hygiene and checkup  Vaccinations: Influenza vaccine: Done 02/02/2020 - Repeat annually Pneumococcal vaccine: Done 02/09/2014 & 05/14/2015 Tdap vaccine: Done 06/11/2017 - Repeat in 10 years Shingles vaccine: Done 02/02/2020; we need date of second dose  Covid-19:Declined  Advanced directives: Please bring a copy of your health care power of attorney and living will to the office to be added to your chart at your convenience.  Conditions/risks identified: Continue at least 30 minutes of exercise or brisk walking each day, drink 6-8 glasses of water and eat lots of fruits and vegetables. Keep up the good work!  Next appointment: Follow up in one year for your annual wellness visit    Preventive Care 65 Years and Older, Female Preventive care refers to lifestyle choices and visits with your health care provider that can promote health and wellness. What does preventive care include? A yearly physical exam. This is also called an annual well check. Dental exams once or twice a year. Routine eye exams. Ask your health care provider how often you should have your eyes checked. Personal lifestyle choices, including: Daily care of your teeth and gums. Regular physical activity. Eating a healthy diet. Avoiding tobacco and drug use. Limiting alcohol use. Practicing safe sex. Taking low-dose aspirin every day. Taking vitamin and mineral supplements as recommended by your  health care provider. What happens during an annual well check? The services and screenings done by your health care provider during your annual well check will depend on your age, overall health, lifestyle risk factors, and family history of disease. Counseling  Your health care provider may ask you questions about your: Alcohol use. Tobacco use. Drug use. Emotional well-being. Home and relationship well-being. Sexual activity. Eating habits. History of falls. Memory and ability to understand (cognition). Work and work Astronomer. Reproductive health. Screening  You may have the following tests or measurements: Height, weight, and BMI. Blood pressure. Lipid and cholesterol levels. These may be checked every 5 years, or more frequently if you are over 57 years old. Skin check. Lung cancer screening. You may have this screening every year starting at age 48 if you have a 30-pack-year history of smoking and currently smoke or have quit within the past 15 years. Fecal occult blood test (FOBT) of the stool. You may have this test every year starting at age 8. Flexible sigmoidoscopy or colonoscopy. You may have a sigmoidoscopy every 5 years or a colonoscopy every 10 years starting at age 44. Hepatitis C blood test. Hepatitis B blood test. Sexually transmitted disease (STD) testing. Diabetes screening. This is done by checking your blood sugar (glucose) after you have not eaten for a while (fasting). You may have this done every 1-3 years. Bone density scan. This is done to screen for osteoporosis. You may have this done starting at age 21. Mammogram. This may be done every 1-2 years. Talk to your health care provider about how often you should have regular mammograms. Talk with your health care provider about your test results, treatment options, and if  necessary, the need for more tests. Vaccines  Your health care provider may recommend certain vaccines, such as: Influenza vaccine.  This is recommended every year. Tetanus, diphtheria, and acellular pertussis (Tdap, Td) vaccine. You may need a Td booster every 10 years. Zoster vaccine. You may need this after age 61. Pneumococcal 13-valent conjugate (PCV13) vaccine. One dose is recommended after age 79. Pneumococcal polysaccharide (PPSV23) vaccine. One dose is recommended after age 32. Talk to your health care provider about which screenings and vaccines you need and how often you need them. This information is not intended to replace advice given to you by your health care provider. Make sure you discuss any questions you have with your health care provider. Document Released: 05/10/2015 Document Revised: 01/01/2016 Document Reviewed: 02/12/2015 Elsevier Interactive Patient Education  2017 [place] Prevention in the Home Falls can cause injuries. They can happen to people of all ages. There are many things you can do to make your home safe and to help prevent falls. What can I do on the outside of my home? Regularly fix the edges of walkways and driveways and fix any cracks. Remove anything that might make you trip as you walk through a door, such as a raised step or threshold. Trim any bushes or trees on the path to your home. Use bright outdoor lighting. Clear any walking paths of anything that might make someone trip, such as rocks or tools. Regularly check to see if handrails are loose or broken. Make sure that both sides of any steps have handrails. Any raised decks and porches should have guardrails on the edges. Have any leaves, snow, or ice cleared regularly. Use sand or salt on walking paths during winter. Clean up any spills in your garage right away. This includes oil or grease spills. What can I do in the bathroom? Use night lights. Install grab bars by the toilet and in the tub and shower. Do not use towel bars as grab bars. Use non-skid mats or decals in the tub or shower. If you need to sit  down in the shower, use a plastic, non-slip stool. Keep the floor dry. Clean up any water that spills on the floor as soon as it happens. Remove soap buildup in the tub or shower regularly. Attach bath mats securely with double-sided non-slip rug tape. Do not have throw rugs and other things on the floor that can make you trip. What can I do in the bedroom? Use night lights. Make sure that you have a light by your bed that is easy to reach. Do not use any sheets or blankets that are too big for your bed. They should not hang down onto the floor. Have a firm chair that has side arms. You can use this for support while you get dressed. Do not have throw rugs and other things on the floor that can make you trip. What can I do in the kitchen? Clean up any spills right away. Avoid walking on wet floors. Keep items that you use a lot in easy-to-reach places. If you need to reach something above you, use a strong step stool that has a grab bar. Keep electrical cords out of the way. Do not use floor polish or wax that makes floors slippery. If you must use wax, use non-skid floor wax. Do not have throw rugs and other things on the floor that can make you trip. What can I do with my stairs? Do not leave any items  on the stairs. Make sure that there are handrails on both sides of the stairs and use them. Fix handrails that are broken or loose. Make sure that handrails are as long as the stairways. Check any carpeting to make sure that it is firmly attached to the stairs. Fix any carpet that is loose or worn. Avoid having throw rugs at the top or bottom of the stairs. If you do have throw rugs, attach them to the floor with carpet tape. Make sure that you have a light switch at the top of the stairs and the bottom of the stairs. If you do not have them, ask someone to add them for you. What else can I do to help prevent falls? Wear shoes that: Do not have high heels. Have rubber bottoms. Are  comfortable and fit you well. Are closed at the toe. Do not wear sandals. If you use a stepladder: Make sure that it is fully opened. Do not climb a closed stepladder. Make sure that both sides of the stepladder are locked into place. Ask someone to hold it for you, if possible. Clearly mark and make sure that you can see: Any grab bars or handrails. First and last steps. Where the edge of each step is. Use tools that help you move around (mobility aids) if they are needed. These include: Canes. Walkers. Scooters. Crutches. Turn on the lights when you go into a dark area. Replace any light bulbs as soon as they burn out. Set up your furniture so you have a clear path. Avoid moving your furniture around. If any of your floors are uneven, fix them. If there are any pets around you, be aware of where they are. Review your medicines with your doctor. Some medicines can make you feel dizzy. This can increase your chance of falling. Ask your doctor what other things that you can do to help prevent falls. This information is not intended to replace advice given to you by your health care provider. Make sure you discuss any questions you have with your health care provider. Document Released: 02/07/2009 Document Revised: 09/19/2015 Document Reviewed: 05/18/2014 Elsevier Interactive Patient Education  2017 [employer].

## 2020-10-03 NOTE — Progress Notes (Signed)
Subjective:   Jeanne Medina is a 70 y.o. female who presents for Medicare Annual (Subsequent) preventive examination.  Virtual Visit via Telephone Note  I connected with  Jeanne Medina on 10/03/20 at 11:15 AM EDT by telephone and verified that I am speaking with the correct person using two identifiers.  Location: Patient: Home Provider: [facility] Persons participating in the virtual visit: patient/Nurse Health Advisor   I discussed the limitations, risks, security and privacy concerns of performing an evaluation and management service by telephone and the availability of in person appointments. The patient expressed understanding and agreed to proceed.  Interactive audio and video telecommunications were attempted between this nurse and patient, however failed, due to patient having technical difficulties OR patient did not have access to video capability.  We continued and completed visit with audio only.  Some vital signs may be absent or patient reported.   Jamiaya Bina E Estephani Popper, LPN   Review of Systems     Cardiac Risk Factors include: advanced age (>6955men, 72>65 women);diabetes mellitus;obesity (BMI >30kg/m2);dyslipidemia;hypertension     Objective:    Today's Vitals   10/03/20 1122  Weight: 174 lb (78.9 kg)  Height: 5\' 3"  (1.6 m)   Body mass index is 30.82 kg/m.  Advanced Directives 10/03/2020 10/03/2019 10/06/2016 08/18/2016 08/13/2016  Does Patient Have a Medical Advance Directive? Yes Yes No No No  Type of Estate agentAdvance Directive Healthcare Power of CenterAttorney;Living will Healthcare Power of Attorney - - -  Does patient want to make changes to medical advance directive? - No - Patient declined - - -  Copy of Healthcare Power of Attorney in Chart? No - copy requested No - copy requested - - -    Current Medications (verified) Outpatient Encounter Medications as of 10/03/2020  Medication Sig   albuterol (VENTOLIN HFA) 108 (90 Base) MCG/ACT inhaler Inhale 2 puffs into the lungs every 6  (six) hours as needed for wheezing or shortness of breath.   amLODipine (NORVASC) 10 MG tablet Take 1 tablet (10 mg total) by mouth daily.   citalopram (CELEXA) 40 MG tablet Take 1 tablet by mouth once daily   diphenhydrAMINE (BENADRYL) 25 MG tablet Take 25 mg by mouth at bedtime as needed (for allergies.).   EQ LORATADINE 10 MG tablet Take 1 tablet by mouth once daily   fluticasone (FLONASE) 50 MCG/ACT nasal spray Place 1 spray into both nostrils 2 (two) times daily.   fluticasone-salmeterol (ADVAIR HFA) 45-21 MCG/ACT inhaler Inhale 2 puffs into the lungs 2 (two) times daily.   L-Lysine 500 MG CAPS Take by mouth.   lisinopril-hydrochlorothiazide (ZESTORETIC) 20-25 MG tablet Take 1 tablet by mouth daily.   lovastatin (MEVACOR) 40 MG tablet Take 1 tablet (40 mg total) by mouth at bedtime.   metFORMIN (GLUCOPHAGE) 500 MG tablet Take 2 tablets (1,000 mg total) by mouth 2 (two) times daily.   Semaglutide (RYBELSUS) 14 MG TABS Take 14 mg by mouth daily.   traZODone (DESYREL) 50 MG tablet Take 1 tablet (50 mg total) by mouth at bedtime as needed. for sleep   triamcinolone (KENALOG) 0.1 % Apply 1 application topically 2 (two) times daily.   No facility-administered encounter medications on file as of 10/03/2020.    Allergies (verified) Farxiga [dapagliflozin] and Sulfa antibiotics   History: Past Medical History:  Diagnosis Date   Anxiety    Arthritis    osteoarthritis   Asthma    Diabetes mellitus without complication (HCC)    Hypercholesteremia    Hypertension  Past Surgical History:  Procedure Laterality Date   CATARACT EXTRACTION W/PHACO Left 08/18/2016   Procedure: CATARACT EXTRACTION PHACO AND INTRAOCULAR LENS PLACEMENT (IOC);  Surgeon: Jethro Bolus, MD;  Location: [facility];  Service: Ophthalmology;  Laterality: Left;  CDE: 6.05   CATARACT EXTRACTION W/PHACO Left 09/01/2016   Procedure: REPOSITIONING OF LEFT IOL LENS;  Surgeon: Jethro Bolus, MD;  Location: [facility];  Service:  Ophthalmology;  Laterality: Left;   CATARACT EXTRACTION W/PHACO Right 10/06/2016   Procedure: CATARACT EXTRACTION PHACO AND INTRAOCULAR LENS PLACEMENT (IOC);  Surgeon: Jethro Bolus, MD;  Location: [facility];  Service: Ophthalmology;  Laterality: Right;  CDE: 6.21   Family History  Problem Relation Age of Onset   Heart failure Mother    Hypertension Mother    Heart disease Mother    Stroke Father    Heart disease Father    Hypertension Father    Dementia Sister    Gallbladder disease Brother    Pancreatic cancer Brother    Heart attack Brother    Rheum arthritis Sister    Heart disease Sister    Cancer Sister        tonsils   Diabetes Sister    Diabetes Sister    Heart disease Sister    Rheum arthritis Sister    Social History   Socioeconomic History   Marital status: Married    Spouse name: Dorene Sorrow   Number of children: 0   Years of education: Not on file   Highest education level: Not on file  Occupational History   Not on file  Tobacco Use   Smoking status: Former    Packs/day: 0.25    Years: 20.00    Pack years: 5.00    Types: Cigarettes    Quit date: 08/13/1980    Years since quitting: 40.1   Smokeless tobacco: Never  Substance and Sexual Activity   Alcohol use: No   Drug use: No   Sexual activity: Yes    Birth control/protection: Post-menopausal  Other Topics Concern   Not on file  Social History Narrative   Lives home with her husband. Works out at [employer] 60 min 3x per week and walks 30 minutes other days.   Social Determinants of Health   Financial Resource Strain: Low Risk    Difficulty of Paying Living Expenses: Not hard at all  Food Insecurity: No Food Insecurity   Worried About Programme researcher, broadcasting/film/video in the Last Year: Never true   Ran Out of Food in the Last Year: Never true  Transportation Needs: Unknown   Lack of Transportation (Medical): No   Lack of Transportation (Non-Medical): Not on file  Physical Activity: Sufficiently Active   Days of Exercise  per Week: 7 days   Minutes of Exercise per Session: 60 min  Stress: No Stress Concern Present   Feeling of Stress : Not at all  Social Connections: Socially Integrated   Frequency of Communication with Friends and Family: More than three times a week   Frequency of Social Gatherings with Friends and Family: More than three times a week   Attends Religious Services: More than 4 times per year   Active Member of [employer] or Organizations: Yes   Attends Engineer, structural: More than 4 times per year   Marital Status: Married    Tobacco Counseling Counseling given: Not Answered   Clinical Intake:  Pre-visit preparation completed: Yes  Pain : No/denies pain     BMI - recorded: 30.82  Nutritional Status: BMI > 30  Obese Nutritional Risks: None Diabetes: Yes CBG done?: No Did pt. bring in CBG monitor from home?: No  How often do you need to have someone help you when you read instructions, pamphlets, or other written materials from your doctor or pharmacy?: 1 - Never  Nutrition Risk Assessment:  Has the patient had any N/V/D within the last 2 months?  No  Does the patient have any non-healing wounds?  No  Has the patient had any unintentional weight loss or weight gain?  No   Diabetes:  Is the patient diabetic?  Yes  If diabetic, was a CBG obtained today?  No  Did the patient bring in their glucometer from home?  No  How often do you monitor your CBG's? Once daily fasting.   Financial Strains and Diabetes Management:  Are you having any financial strains with the device, your supplies or your medication? No .  Does the patient want to be seen by Chronic Care Management for management of their diabetes?  No  Would the patient like to be referred to a Nutritionist or for Diabetic Management?  No   Diabetic Exams:  Diabetic Eye Exam: Completed 07/30/2020.   Diabetic Medina Exam: Completed 01/02/2020. Pt has been advised about the importance in completing this exam. Pt  is scheduled for diabetic Medina exam on 11/2020.    Interpreter Needed?: No  Information entered by :: Hertha Gergen, LPN   Activities of Daily Living In your present state of health, do you have any difficulty performing the following activities: 10/03/2020  Hearing? N  Vision? N  Difficulty concentrating or making decisions? N  Walking or climbing stairs? N  Dressing or bathing? N  Doing errands, shopping? N  Preparing Food and eating ? N  Using the Toilet? N  In the past six months, have you accidently leaked urine? N  Do you have problems with loss of bowel control? N  Managing your Medications? N  Managing your Finances? N  Housekeeping or managing your Housekeeping? N  Some recent data might be hidden    Patient Care Team: Gwenlyn Fudge, FNP as PCP - General (Family Medicine) Danella Maiers, [facility] (Pharmacist)  Indicate any recent Medical Services you may have received from other than [facility] providers in the past year (date may be approximate).     Assessment:   This is a routine wellness examination for Mija.  Hearing/Vision screen Hearing Screening - Comments:: Denies hearing difficulties Vision Screening - Comments:: Denies vision difficulties - up to date with annual eye exam with Dr Conley Rolls in [place]  Dietary issues and exercise activities discussed: Current Exercise Habits: Home exercise routine, Type of exercise: walking;strength training/weights, Time (Minutes): 45, Frequency (Times/Week): 7, Weekly Exercise (Minutes/Week): 315, Intensity: Moderate, Exercise limited by: None identified   Goals Addressed             This Visit's Progress    Client will verbalize knowledge of diabetes self-management as evidenced by Hgb A1C <7 or as defined by provider.   On track    Diabetes self management actions: Glucose monitoring per provider recommendations Perform Quality checks on blood meter Eat Healthy Check feet daily Visit provider every 3-6 months as  directed Hbg A1C level every 3-6 months. Eye Exam yearly      DIET - INCREASE WATER INTAKE         Depression Screen [facility] 2/9 Scores 10/03/2020 08/15/2020 08/15/2020 05/16/2020 01/02/2020 10/03/2019 05/31/2019  PHQ -  2 Score 0 1 1 0 0 0 0  PHQ- 9 Score 0 4 - 3 - - 1    Fall Risk Fall Risk  10/03/2020 08/15/2020 01/02/2020 10/03/2019 05/31/2019  Falls in the past year? 0 0 0 0 0  Number falls in past yr: 0 - - - -  Injury with Fall? 0 - - - -  Risk for fall due to : No Fall Risks - - - -  Follow up Falls prevention discussed - - - -    FALL RISK PREVENTION PERTAINING TO THE HOME:  Any stairs in or around the home? Yes  If so, are there any without handrails? No  Home free of loose throw rugs in walkways, pet beds, electrical cords, etc? Yes  Adequate lighting in your home to reduce risk of falls? Yes   ASSISTIVE DEVICES UTILIZED TO PREVENT FALLS:  Life alert? No  Use of a cane, walker or w/c? No  Grab bars in the bathroom? No  Shower chair or bench in shower? No  Elevated toilet seat or a handicapped toilet? No   TIMED UP AND GO:  Was the test performed? No . Telephonic visit  Cognitive Function: Normal cognitive status assessed by direct observation by this Nurse Health Advisor. No abnormalities found.       6CIT Screen 10/03/2019  What Year? 0 points  What month? 0 points  What time? 0 points  Count back from 20 0 points  Months in reverse 0 points  Repeat phrase 0 points  Total Score 0    Immunizations Immunization History  Administered Date(s) Administered   Influenza Split 02/10/2017   Influenza,inj,Quad PF,6+ Mos 01/26/2019   Influenza,inj,quad, With Preservative 01/28/2018   Influenza-Unspecified 03/14/2007, 02/09/2014, 02/08/2015, 01/28/2016, 01/28/2018, 02/02/2020   Pneumococcal Conjugate-13 02/09/2014   Pneumococcal Polysaccharide-23 05/14/2015   Td 05/09/1998   Tdap 06/11/2017   Zoster Recombinat (Shingrix) 02/02/2020   Zoster, Live 12/02/2012    TDAP status:  Up to date  Flu Vaccine status: Up to date  Pneumococcal vaccine status: Up to date  Covid-19 vaccine status: Declined, Education has been provided regarding the importance of this vaccine but patient still declined. Advised may receive this vaccine at local pharmacy or Health Dept.or vaccine clinic. Aware to provide a copy of the vaccination record if obtained from local pharmacy or Health Dept. Verbalized acceptance and understanding.  Qualifies for Shingles Vaccine? Yes   Zostavax completed Yes   Shingrix Completed?: Yes  Screening Tests Health Maintenance  Topic Date Due   COVID-19 Vaccine (1) Never done   MAMMOGRAM  07/05/2019   Zoster Vaccines- Shingrix (2 of 2) 03/29/2020   DEXA SCAN  01/01/2021 (Originally 03/26/2015)   COLONOSCOPY (Pts 45-73yrs Insurance coverage will need to be confirmed)  01/01/2021 (Originally 03/26/1995)   INFLUENZA VACCINE  11/25/2020   Medina EXAM  01/01/2021   HEMOGLOBIN A1C  02/14/2021   COLON CANCER SCREENING ANNUAL FOBT  04/11/2021   OPHTHALMOLOGY EXAM  07/30/2021   TETANUS/TDAP  06/12/2027   Hepatitis C Screening  Completed   PNA vac Low Risk Adult  Completed   Pneumococcal Vaccine 56-76 Years old  Aged Out   HPV VACCINES  Aged Out    Health Maintenance  Health Maintenance Due  Topic Date Due   COVID-19 Vaccine (1) Never done   MAMMOGRAM  07/05/2019   Zoster Vaccines- Shingrix (2 of 2) 03/29/2020    Colorectal cancer screening: Type of screening: FOBT/FIT. Completed 04/11/2020. Repeat every 1 years  Mammogram status: Completed 07/05/2018. Repeat every year She has appt at [facility] 11/30/20  Bone Density status: Ordered 10/03/20. Pt provided with contact info and advised to call to schedule appt.  Lung Cancer Screening: (Low Dose CT Chest recommended if Age 87-80 years, 30 pack-year currently smoking OR have quit w/in 15years.) does not qualify.   Additional Screening:  Hepatitis C Screening: does qualify; Completed  12/23/2017  Vision Screening: Recommended annual ophthalmology exams for early detection of glaucoma and other disorders of the eye. Is the patient up to date with their annual eye exam?  Yes  Who is the provider or what is the name of the office in which the patient attends annual eye exams? Dr Conley Rolls If pt is not established with a provider, would they like to be referred to a provider to establish care? No .   Dental Screening: Recommended annual dental exams for proper oral hygiene  Community Resource Referral / Chronic Care Management: CRR required this visit?  No   CCM required this visit?  No      Plan:     I have personally reviewed and noted the following in the patient's chart:   Medical and social history Use of alcohol, tobacco or illicit drugs  Current medications and supplements including opioid prescriptions.  Functional ability and status Nutritional status Physical activity Advanced directives List of other physicians Hospitalizations, surgeries, and ER visits in previous 12 months Vitals Screenings to include cognitive, depression, and falls Referrals and appointments  In addition, I have reviewed and discussed with patient certain preventive protocols, quality metrics, and best practice recommendations. A written personalized care plan for preventive services as well as general preventive health recommendations were provided to patient.     [place] Constable, LPN   [id number]   Nurse Notes: None

## 2020-10-21 ENCOUNTER — Encounter: Payer: Self-pay | Admitting: Family Medicine

## 2020-10-22 ENCOUNTER — Telehealth: Payer: Self-pay | Admitting: Pharmacist

## 2020-10-22 NOTE — Telephone Encounter (Signed)
Before my leave, I thought patient was getting meds from health department.  Has this changed?    If she needs assistance, she will have to enroll and in CCM and sign forms for patient assistance, etc  This process will take 4-6 weeks.  I don't know what the health department has done for her and will need them to fax her forms to me.

## 2020-10-22 NOTE — Telephone Encounter (Signed)
Spoke with patient and she was getting her meds from the health dept- gave patient their phone number for her to contact them

## 2020-10-24 NOTE — Telephone Encounter (Signed)
Patient aware and verbalizes understanding. 

## 2020-10-24 NOTE — Telephone Encounter (Signed)
LET PATIENT KNOW 1 BOX OF RYBELSUS 3MG  IS UP FRONT WITH HER NAME ON IT.  WE WILL ONLY BE ABLE TO SUPPLY ONE BOX.  SHE MAY WANT TO CUT BACK AND ONLY TAKE 2 PILLS OF IT DAILY TO LAST HER UNTIL HER SHIPMENT FROM HEALTH DEPT COMES IN

## 2020-10-28 ENCOUNTER — Other Ambulatory Visit: Payer: Self-pay | Admitting: Family Medicine

## 2020-10-28 DIAGNOSIS — G479 Sleep disorder, unspecified: Secondary | ICD-10-CM

## 2020-11-14 ENCOUNTER — Ambulatory Visit (INDEPENDENT_AMBULATORY_CARE_PROVIDER_SITE_OTHER): Payer: Medicare Other | Admitting: Family Medicine

## 2020-11-14 ENCOUNTER — Other Ambulatory Visit: Payer: Self-pay

## 2020-11-14 ENCOUNTER — Encounter: Payer: Self-pay | Admitting: Family Medicine

## 2020-11-14 VITALS — BP 140/78 | HR 93 | Temp 97.9°F | Ht 63.0 in | Wt 174.6 lb

## 2020-11-14 DIAGNOSIS — Z7189 Other specified counseling: Secondary | ICD-10-CM | POA: Diagnosis not present

## 2020-11-14 DIAGNOSIS — Z0000 Encounter for general adult medical examination without abnormal findings: Secondary | ICD-10-CM

## 2020-11-14 DIAGNOSIS — E7800 Pure hypercholesterolemia, unspecified: Secondary | ICD-10-CM

## 2020-11-14 DIAGNOSIS — E1165 Type 2 diabetes mellitus with hyperglycemia: Secondary | ICD-10-CM

## 2020-11-14 DIAGNOSIS — Z0001 Encounter for general adult medical examination with abnormal findings: Secondary | ICD-10-CM

## 2020-11-14 DIAGNOSIS — I10 Essential (primary) hypertension: Secondary | ICD-10-CM | POA: Diagnosis not present

## 2020-11-14 DIAGNOSIS — F419 Anxiety disorder, unspecified: Secondary | ICD-10-CM

## 2020-11-14 LAB — BAYER DCA HB A1C WAIVED: HB A1C (BAYER DCA - WAIVED): 7.9 % — ABNORMAL HIGH (ref <7.0)

## 2020-11-14 MED ORDER — METFORMIN HCL 500 MG PO TABS: 1000.0000 mg | ORAL_TABLET | Freq: Two times a day (BID) | ORAL | 1 refills | Status: DC

## 2020-11-14 MED ORDER — AMLODIPINE BESYLATE 10 MG PO TABS: 10.0000 mg | ORAL_TABLET | Freq: Every day | ORAL | 1 refills | Status: DC

## 2020-11-14 MED ORDER — LOVASTATIN 40 MG PO TABS: 40.0000 mg | ORAL_TABLET | Freq: Every day | ORAL | 1 refills | Status: DC

## 2020-11-14 MED ORDER — CITALOPRAM HYDROBROMIDE 40 MG PO TABS: 40.0000 mg | ORAL_TABLET | Freq: Every day | ORAL | 1 refills | Status: DC

## 2020-11-14 MED ORDER — LISINOPRIL-HYDROCHLOROTHIAZIDE 20-25 MG PO TABS: 1.0000 | ORAL_TABLET | Freq: Every day | ORAL | 1 refills | Status: DC

## 2020-11-14 NOTE — Progress Notes (Signed)
Assessment & Plan:  1. Well adult exam Preventive care education provided. Patient will do Cologuard this December when she is due for colorectal cancer screening again.  2. ACP (advance care planning) Asked to bring a copy of documents for [place] to scan into her chart.   3. Type 2 diabetes mellitus with hyperglycemia, without long-term current use of insulin (HCC) Lab Results  Component Value Date   HGBA1C 7.9 (H) 11/14/2020   HGBA1C 6.4 08/15/2020   HGBA1C 7.7 (H) 05/16/2020    - Diabetes is not at goal of A1c < 7. - Medications: continue current medications - changes not made since patient has not been taking medication as prescribed - Home glucose monitoring: continue monitoring - Patient is currently taking a statin. Patient is taking an ACE-inhibitor/ARB.   Diabetes Health Maintenance Due  Topic Date Due   FOOT EXAM  01/01/2021   HEMOGLOBIN A1C  05/17/2021   OPHTHALMOLOGY EXAM  07/30/2021    - Lipid panel - CBC with Differential/Platelet - CMP14+EGFR - Bayer DCA Hb A1c Waived - lovastatin (MEVACOR) 40 MG tablet; Take 1 tablet (40 mg total) by mouth at bedtime.  Dispense: 90 tablet; Refill: 1 - metFORMIN (GLUCOPHAGE) 500 MG tablet; Take 2 tablets (1,000 mg total) by mouth 2 (two) times daily.  Dispense: 360 tablet; Refill: 1  4. Essential hypertension Well controlled on current regimen.  - Lipid panel - CBC with Differential/Platelet - CMP14+EGFR - lisinopril-hydrochlorothiazide (ZESTORETIC) 20-25 MG tablet; Take 1 tablet by mouth daily.  Dispense: 90 tablet; Refill: 1 - amLODipine (NORVASC) 10 MG tablet; Take 1 tablet (10 mg total) by mouth daily.  Dispense: 90 tablet; Refill: 1  5. Hypercholesteremia Labs to assess. - Lipid panel - CMP14+EGFR - lovastatin (MEVACOR) 40 MG tablet; Take 1 tablet (40 mg total) by mouth at bedtime.  Dispense: 90 tablet; Refill: 1  6. Anxiety Well controlled on current regimen.  - CMP14+EGFR - citalopram (CELEXA) 40 MG tablet; Take  1 tablet (40 mg total) by mouth daily.  Dispense: 90 tablet; Refill: 1   Follow-up: Return in about 3 months (around 02/14/2021) for DM.   Hendricks Limes, MSN, APRN, FNP-C [facility]  Subjective:  Patient ID: Jeanne Medina, female    DOB: 10-24-1950  Age: 70 y.o. MRN: [id number]  Patient Care Team: Loman Brooklyn, FNP as PCP - General (Family Medicine) Lavera Guise, [facility] (Pharmacist) Harlen Labs, MD as Referring Physician (Optometry)   CC:  Chief Complaint  Patient presents with   Annual Exam    HPI Jeanne Medina presents for her annual physical.  Occupation: Retired, Marital status: Married, Substance use: None Diet: Diabetic, [facility]; walks 30 minutes each morning on days she is unable to [facility] Last eye exam: 07/30/2020 Last dental exam: Not recent Last colonoscopy: FOBT December 2021 Last mammogram: 07/05/2018; next scheduled 11/29/2020 DEXA: Declined Hepatitis C Screening: Negative in 2019 Immunizations: Flu Vaccine:  not flu season Tdap Vaccine: up to date  Shingrix Vaccine: has received one dose  COVID-19 Vaccine: declined Pneumonia Vaccine: up to date  Advanced Directives Patient does have advanced directives including living will and healthcare power of attorney. She does not have a copy in the electronic medical record.   DEPRESSION SCREENING PHQ 2/9 Scores 11/14/2020 10/03/2020 08/15/2020 08/15/2020 05/16/2020 01/02/2020 10/03/2019  PHQ - 2 Score 0 0 1 1 0 0 0  PHQ- 9 Score 0  0 4 - 3 - -     Diabetes: Patient presents for follow up of diabetes. Current symptoms include: none. Known diabetic complications: none. Medication compliance: has not been able to take Rybelsus as prescribed as she gets this from the health department with prescription assistance and has not been able to get in it - she has been taking 3 mg daily instead of 14 mg. Current diet: in general, a "healthy" diet  .  Current exercise: walking. Home blood sugar records:  high, but <200 . Is she  on ACE inhibitor or angiotensin II receptor blocker? Yes. Is she on a statin? Yes.    Review of Systems  Constitutional:  Negative for chills, fever, malaise/fatigue and weight loss.  HENT:  Negative for congestion, ear discharge, ear pain, nosebleeds, sinus pain, sore throat and tinnitus.   Eyes:  Negative for blurred vision, double vision, pain, discharge and redness.  Respiratory:  Negative for cough, shortness of breath and wheezing.   Cardiovascular:  Negative for chest pain, palpitations and leg swelling.  Gastrointestinal:  Negative for abdominal pain, constipation, diarrhea, heartburn, nausea and vomiting.  Genitourinary:  Negative for dysuria, frequency and urgency.  Musculoskeletal:  Negative for myalgias.  Skin:  Negative for rash.  Neurological:  Negative for dizziness, seizures, weakness and headaches.  Psychiatric/Behavioral:  Negative for depression, substance abuse and suicidal ideas. The patient is not nervous/anxious.     Current Outpatient Medications:    albuterol (VENTOLIN HFA) 108 (90 Base) MCG/ACT inhaler, Inhale 2 puffs into the lungs every 6 (six) hours as needed for wheezing or shortness of breath., Disp: 18 g, Rfl: 2   amLODipine (NORVASC) 10 MG tablet, Take 1 tablet (10 mg total) by mouth daily., Disp: 90 tablet, Rfl: 1   citalopram (CELEXA) 40 MG tablet, Take 1 tablet by mouth once daily, Disp: 90 tablet, Rfl: 0   diphenhydrAMINE (BENADRYL) 25 MG tablet, Take 25 mg by mouth at bedtime as needed (for allergies.)., Disp: , Rfl:    EQ LORATADINE 10 MG tablet, Take 1 tablet by mouth once daily, Disp: 90 tablet, Rfl: 2   fluticasone (FLONASE) 50 MCG/ACT nasal spray, Place 1 spray into both nostrils 2 (two) times daily., Disp: 48 g, Rfl: 1   fluticasone-salmeterol (ADVAIR HFA) 45-21 MCG/ACT inhaler, Inhale 2 puffs into the lungs 2 (two) times daily., Disp: 1 each, Rfl: 12   L-Lysine 500 MG  CAPS, Take by mouth., Disp: , Rfl:    lisinopril-hydrochlorothiazide (ZESTORETIC) 20-25 MG tablet, Take 1 tablet by mouth daily., Disp: 90 tablet, Rfl: 1   lovastatin (MEVACOR) 40 MG tablet, Take 1 tablet (40 mg total) by mouth at bedtime., Disp: 90 tablet, Rfl: 1   metFORMIN (GLUCOPHAGE) 500 MG tablet, Take 2 tablets (1,000 mg total) by mouth 2 (two) times daily., Disp: 360 tablet, Rfl: 1   Semaglutide (RYBELSUS) 14 MG TABS, Take 14 mg by mouth daily., Disp: 90 tablet, Rfl: 3   traZODone (DESYREL) 50 MG tablet, TAKE 1 TABLET BY MOUTH AT BEDTIME AS NEEDED FOR SLEEP, Disp: 90 tablet, Rfl: 0   triamcinolone (KENALOG) 0.1 %, Apply 1 application topically 2 (two) times daily., Disp: 80 g, Rfl: 2  Allergies  Allergen Reactions   Farxiga [Dapagliflozin]     Yeast infections   Sulfa Antibiotics Hives and Itching    welts    Past Medical History:  Diagnosis Date   Anxiety    Arthritis    osteoarthritis   Asthma    Diabetes mellitus  without complication ([place])    Hypercholesteremia    Hypertension     Past Surgical History:  Procedure Laterality Date   CATARACT EXTRACTION W/PHACO Left 08/18/2016   Procedure: CATARACT EXTRACTION PHACO AND INTRAOCULAR LENS PLACEMENT (IOC);  Surgeon: Rutherford Guys, MD;  Location: [facility];  Service: Ophthalmology;  Laterality: Left;  CDE: 6.05   CATARACT EXTRACTION W/PHACO Left 09/01/2016   Procedure: REPOSITIONING OF LEFT IOL LENS;  Surgeon: Rutherford Guys, MD;  Location: [facility];  Service: Ophthalmology;  Laterality: Left;   CATARACT EXTRACTION W/PHACO Right 10/06/2016   Procedure: CATARACT EXTRACTION PHACO AND INTRAOCULAR LENS PLACEMENT (IOC);  Surgeon: Rutherford Guys, MD;  Location: [facility];  Service: Ophthalmology;  Laterality: Right;  CDE: 6.21    Family History  Problem Relation Age of Onset   Heart failure Mother    Hypertension Mother    Heart disease Mother    Stroke Father    Heart disease Father    Hypertension Father    Dementia Sister     Gallbladder disease Brother    Pancreatic cancer Brother    Heart attack Brother    Rheum arthritis Sister    Heart disease Sister    Cancer Sister        tonsils   Diabetes Sister    Diabetes Sister    Heart disease Sister    Rheum arthritis Sister     Social History   Socioeconomic History   Marital status: Married    Spouse name: Sonia Side   Number of children: 0   Years of education: Not on file   Highest education level: Not on file  Occupational History   Not on file  Tobacco Use   Smoking status: Former    Packs/day: 0.25    Years: 20.00    Pack years: 5.00    Types: Cigarettes    Quit date: 08/13/1980    Years since quitting: 40.2   Smokeless tobacco: Never  Substance and Sexual Activity   Alcohol use: No   Drug use: No   Sexual activity: Yes    Birth control/protection: Post-menopausal  Other Topics Concern   Not on file  Social History Narrative   Lives home with her husband. Works out at [employer] 60 min 3x per week and walks 30 minutes other days.   Social Determinants of Health   Financial Resource Strain: Low Risk    Difficulty of Paying Living Expenses: Not hard at all  Food Insecurity: No Food Insecurity   Worried About Charity fundraiser in the Last Year: Never true   [place] in the Last Year: Never true  Transportation Needs: Unknown   Lack of Transportation (Medical): No   Lack of Transportation (Non-Medical): Not on file  Physical Activity: Sufficiently Active   Days of Exercise per Week: 7 days   Minutes of Exercise per Session: 60 min  Stress: No Stress Concern Present   Feeling of Stress : Not at all  Social Connections: Socially Integrated   Frequency of Communication with Friends and Family: More than three times a week   Frequency of Social Gatherings with Friends and Family: More than three times a week   Attends Religious Services: More than 4 times per year   Active Member of [employer] or Organizations: Yes   Attends Programme researcher, broadcasting/film/video: More than 4 times per year   Marital Status: Married  Human resources officer Violence: Not At Risk   Fear of Current or Ex-Partner:  No   Emotionally Abused: No   Physically Abused: No   Sexually Abused: No      Objective:    BP 140/78   Pulse 93   Temp 97.9 F (36.6 C) (Temporal)   Ht 5' 3" (1.6 m)   Wt 174 lb 9.6 oz (79.2 kg)   SpO2 96%   BMI 30.93 kg/m   Wt Readings from Last 3 Encounters:  11/14/20 174 lb 9.6 oz (79.2 kg)  10/03/20 174 lb (78.9 kg)  08/15/20 179 lb (81.2 kg)    Physical Exam Vitals reviewed.  Constitutional:      General: She is not in acute distress.    Appearance: Normal appearance. She is obese. She is not ill-appearing, toxic-appearing or diaphoretic.  HENT:     Head: Normocephalic and atraumatic.     Right Ear: Tympanic membrane, ear canal and external ear normal. There is no impacted cerumen.     Left Ear: Tympanic membrane, ear canal and external ear normal. There is no impacted cerumen.     Nose: Nose normal. No congestion or rhinorrhea.     Mouth/Throat:     Mouth: Mucous membranes are moist.     Pharynx: Oropharynx is clear. No oropharyngeal exudate or posterior oropharyngeal erythema.  Eyes:     General: No scleral icterus.       Right eye: No discharge.        Left eye: No discharge.     Conjunctiva/sclera: Conjunctivae normal.     Pupils: Pupils are equal, round, and reactive to light.  Cardiovascular:     Rate and Rhythm: Normal rate and regular rhythm.     Heart sounds: Normal heart sounds. No murmur heard.   No friction rub. No gallop.  Pulmonary:     Effort: Pulmonary effort is normal. No respiratory distress.     Breath sounds: Normal breath sounds. No stridor. No wheezing, rhonchi or rales.  Abdominal:     General: Abdomen is flat. Bowel sounds are normal. There is no distension.     Palpations: Abdomen is soft. There is no hepatomegaly, splenomegaly or mass.     Tenderness: There is no abdominal  tenderness. There is no guarding or rebound.     Hernia: No hernia is present.  Musculoskeletal:        General: Normal range of motion.     Cervical back: Normal range of motion and neck supple. No rigidity. No muscular tenderness.  Lymphadenopathy:     Cervical: No cervical adenopathy.  Skin:    General: Skin is warm and dry.     Capillary Refill: Capillary refill takes less than 2 seconds.  Neurological:     General: No focal deficit present.     Mental Status: She is alert and oriented to person, place, and time. Mental status is at baseline.  Psychiatric:        Mood and Affect: Mood normal.        Behavior: Behavior normal.        Thought Content: Thought content normal.        Judgment: Judgment normal.    No results found for: TSH Lab Results  Component Value Date   WBC 11.3 (H) 08/15/2020   HGB 12.2 08/15/2020   HCT 37.7 08/15/2020   MCV 83 08/15/2020   PLT 421 08/15/2020   Lab Results  Component Value Date   NA 140 08/15/2020   K 4.6 08/15/2020   CO2 22 08/15/2020   GLUCOSE  102 (H) 08/15/2020   BUN 10 08/15/2020   CREATININE 0.98 08/15/2020   BILITOT 0.4 08/15/2020   ALKPHOS 90 08/15/2020   AST 11 08/15/2020   ALT 9 08/15/2020   PROT 6.4 08/15/2020   ALBUMIN 4.3 08/15/2020   CALCIUM 9.8 08/15/2020   ANIONGAP 12 08/13/2016   EGFR 62 08/15/2020   Lab Results  Component Value Date   CHOL 147 08/15/2020   Lab Results  Component Value Date   HDL 68 08/15/2020   Lab Results  Component Value Date   LDLCALC 58 08/15/2020   Lab Results  Component Value Date   TRIG 117 08/15/2020   Lab Results  Component Value Date   CHOLHDL 2.2 08/15/2020   Lab Results  Component Value Date   HGBA1C 6.4 08/15/2020

## 2020-11-14 NOTE — Patient Instructions (Signed)
Due for colorectal cancer screening on 04/11/2021. We can order Cologuard next time so it will be good for 3 years.

## 2020-11-15 LAB — CMP14+EGFR
ALT: 7 IU/L (ref 0–32)
AST: 9 IU/L (ref 0–40)
Albumin/Globulin Ratio: 2 (ref 1.2–2.2)
Albumin: 4.4 g/dL (ref 3.8–4.8)
Alkaline Phosphatase: 96 IU/L (ref 44–121)
BUN/Creatinine Ratio: 10 — ABNORMAL LOW (ref 12–28)
BUN: 11 mg/dL (ref 8–27)
Bilirubin Total: 0.4 mg/dL (ref 0.0–1.2)
CO2: 29 mmol/L (ref 20–29)
Calcium: 10 mg/dL (ref 8.7–10.3)
Chloride: 100 mmol/L (ref 96–106)
Creatinine, Ser: 1.07 mg/dL — ABNORMAL HIGH (ref 0.57–1.00)
Globulin, Total: 2.2 g/dL (ref 1.5–4.5)
Glucose: 153 mg/dL — ABNORMAL HIGH (ref 65–99)
Potassium: 5.1 mmol/L (ref 3.5–5.2)
Sodium: 142 mmol/L (ref 134–144)
Total Protein: 6.6 g/dL (ref 6.0–8.5)
eGFR: 56 mL/min/{1.73_m2} — ABNORMAL LOW (ref >59)

## 2020-11-15 LAB — CBC WITH DIFFERENTIAL/PLATELET
Basophils Absolute: 0 10*3/uL (ref 0.0–0.2)
Basos: 0 %
EOS (ABSOLUTE): 0.4 10*3/uL (ref 0.0–0.4)
Eos: 4 %
Hematocrit: 37.7 % (ref 34.0–46.6)
Hemoglobin: 12.7 g/dL (ref 11.1–15.9)
Immature Grans (Abs): 0 10*3/uL (ref 0.0–0.1)
Immature Granulocytes: 0 %
Lymphocytes Absolute: 2.3 10*3/uL (ref 0.7–3.1)
Lymphs: 26 %
MCH: 28.4 pg (ref 26.6–33.0)
MCHC: 33.7 g/dL (ref 31.5–35.7)
MCV: 84 fL (ref 79–97)
Monocytes Absolute: 0.6 10*3/uL (ref 0.1–0.9)
Monocytes: 7 %
Neutrophils Absolute: 5.7 10*3/uL (ref 1.4–7.0)
Neutrophils: 63 %
Platelets: 430 10*3/uL (ref 150–450)
RBC: 4.47 x10E6/uL (ref 3.77–5.28)
RDW: 13.4 % (ref 11.7–15.4)
WBC: 9 10*3/uL (ref 3.4–10.8)

## 2020-11-15 LAB — LIPID PANEL
Chol/HDL Ratio: 2.4 ratio (ref 0.0–4.4)
Cholesterol, Total: 160 mg/dL (ref 100–199)
HDL: 67 mg/dL (ref >39)
LDL Chol Calc (NIH): 68 mg/dL (ref 0–99)
Triglycerides: 146 mg/dL (ref 0–149)
VLDL Cholesterol Cal: 25 mg/dL (ref 5–40)

## 2020-11-17 ENCOUNTER — Encounter: Payer: Self-pay | Admitting: Family Medicine

## 2020-11-26 NOTE — Progress Notes (Signed)
Shingirx updated in chart.

## 2020-11-29 DIAGNOSIS — Z1231 Encounter for screening mammogram for malignant neoplasm of breast: Secondary | ICD-10-CM | POA: Diagnosis not present

## 2021-01-14 ENCOUNTER — Other Ambulatory Visit: Payer: Self-pay | Admitting: Family Medicine

## 2021-01-14 DIAGNOSIS — G479 Sleep disorder, unspecified: Secondary | ICD-10-CM

## 2021-01-14 NOTE — Telephone Encounter (Signed)
Last office visit 11/14/20 Last refill 10/29/20, #90, no refills

## 2021-02-12 ENCOUNTER — Other Ambulatory Visit: Payer: Self-pay | Admitting: Family Medicine

## 2021-02-12 DIAGNOSIS — J0110 Acute frontal sinusitis, unspecified: Secondary | ICD-10-CM

## 2021-02-18 ENCOUNTER — Other Ambulatory Visit: Payer: Self-pay

## 2021-02-18 ENCOUNTER — Encounter: Payer: Self-pay | Admitting: Family Medicine

## 2021-02-18 ENCOUNTER — Ambulatory Visit (INDEPENDENT_AMBULATORY_CARE_PROVIDER_SITE_OTHER): Payer: Medicare Other | Admitting: Family Medicine

## 2021-02-18 ENCOUNTER — Telehealth: Payer: Self-pay | Admitting: Family Medicine

## 2021-02-18 VITALS — BP 135/79 | HR 99 | Temp 97.0°F | Resp 20 | Ht 63.0 in | Wt 170.0 lb

## 2021-02-18 DIAGNOSIS — E7800 Pure hypercholesterolemia, unspecified: Secondary | ICD-10-CM | POA: Diagnosis not present

## 2021-02-18 DIAGNOSIS — E1165 Type 2 diabetes mellitus with hyperglycemia: Secondary | ICD-10-CM

## 2021-02-18 DIAGNOSIS — I10 Essential (primary) hypertension: Secondary | ICD-10-CM

## 2021-02-18 DIAGNOSIS — Z1211 Encounter for screening for malignant neoplasm of colon: Secondary | ICD-10-CM

## 2021-02-18 LAB — BAYER DCA HB A1C WAIVED: HB A1C (BAYER DCA - WAIVED): 7 % — ABNORMAL HIGH (ref 4.8–5.6)

## 2021-02-18 NOTE — Progress Notes (Signed)
Assessment & Plan:  1. Type 2 diabetes mellitus with hyperglycemia, without long-term current use of insulin (HCC) Lab Results  Component Value Date   HGBA1C 7.0 (H) 02/18/2021   HGBA1C 7.9 (H) 11/14/2020   HGBA1C 6.4 08/15/2020    - Diabetes is not at goal of A1c < 7. - Medications: continue current medications - Home glucose monitoring: continue monitoring - Patient is currently taking a statin. Patient is taking an ACE-inhibitor/ARB.   Diabetes Health Maintenance Due  Topic Date Due   OPHTHALMOLOGY EXAM  07/30/2021   HEMOGLOBIN A1C  08/19/2021   FOOT EXAM  02/18/2022    Lab Results  Component Value Date   LABMICR 21.5 02/18/2021   LABMICR 12.0 01/02/2020   - Microalbumin / creatinine urine ratio - Bayer DCA Hb A1c Waived - CBC with Differential/Platelet - CMP14+EGFR - Lipid panel  2. Essential hypertension Well controlled on current regimen.  - CBC with Differential/Platelet - CMP14+EGFR - Lipid panel  3. Hypercholesteremia Well controlled on current regimen.  - CBC with Differential/Platelet - CMP14+EGFR - Lipid panel  4. Colon cancer screening - Cologuard   Return in about 3 months (around 05/21/2021) for follow-up of chronic medication conditions.  Hendricks Limes, MSN, APRN, FNP-C [facility]  Subjective:    Patient ID: Jeanne Medina, female    DOB: [birth year], 70 y.o.   MRN: [id number]  Patient Care Team: Loman Brooklyn, FNP as PCP - General (Family Medicine) Lavera Guise, [facility] (Pharmacist) Harlen Labs, MD as Referring Physician (Optometry)   Chief Complaint:  Chief Complaint  Patient presents with   Medical Management of Chronic Issues    HPI: Jeanne Medina is a 70 y.o. female presenting on 02/18/2021 for Medical Management of Chronic Issues  Diabetes: Patient presents for follow up of diabetes. Current symptoms include: none.  Fasting blood sugar this morning was 134, which she says in normal for her.   Known diabetic complications: none. Medication compliance: yes. Current diet: in general, a "healthy" diet  . Current exercise:  30 minutes of cardio with 30 minutes of weight training 3x/week .  Is she  on ACE inhibitor or angiotensin II receptor blocker? Yes. Is she on a statin? Yes.     New complaints: None  Social history:  Relevant past medical, surgical, family and social history reviewed and updated as indicated. Interim medical history since our last visit reviewed.  Allergies and medications reviewed and updated.  DATA REVIEWED: CHART IN EPIC  ROS: Negative unless specifically indicated above in HPI.    Current Outpatient Medications:    albuterol (VENTOLIN HFA) 108 (90 Base) MCG/ACT inhaler, Inhale 2 puffs into the lungs every 6 (six) hours as needed for wheezing or shortness of breath., Disp: 18 g, Rfl: 2   amLODipine (NORVASC) 10 MG tablet, Take 1 tablet (10 mg total) by mouth daily., Disp: 90 tablet, Rfl: 1   citalopram (CELEXA) 40 MG tablet, Take 1 tablet (40 mg total) by mouth daily., Disp: 90 tablet, Rfl: 1   diphenhydrAMINE (BENADRYL) 25 MG tablet, Take 25 mg by mouth at bedtime as needed (for allergies.)., Disp: , Rfl:    EQ LORATADINE 10 MG tablet, Take 1 tablet by mouth once daily, Disp: 90 tablet, Rfl: 2   fluticasone (FLONASE) 50 MCG/ACT nasal spray, Use 1 spray(s) in each nostril twice daily, Disp: 48 g, Rfl: 0   L-Lysine 500 MG CAPS, Take by mouth., Disp: , Rfl:    lisinopril-hydrochlorothiazide (  ZESTORETIC) 20-25 MG tablet, Take 1 tablet by mouth daily., Disp: 90 tablet, Rfl: 1   lovastatin (MEVACOR) 40 MG tablet, Take 1 tablet (40 mg total) by mouth at bedtime., Disp: 90 tablet, Rfl: 1   metFORMIN (GLUCOPHAGE) 500 MG tablet, Take 2 tablets (1,000 mg total) by mouth 2 (two) times daily., Disp: 360 tablet, Rfl: 1   Semaglutide (RYBELSUS) 14 MG TABS, Take 14 mg by mouth daily., Disp: 90 tablet, Rfl: 3   traZODone (DESYREL) 50 MG tablet, TAKE 1 TABLET BY MOUTH AT  BEDTIME AS NEEDED FOR SLEEP, Disp: 90 tablet, Rfl: 1   triamcinolone (KENALOG) 0.1 %, Apply 1 application topically 2 (two) times daily., Disp: 80 g, Rfl: 2   fluticasone-salmeterol (ADVAIR HFA) 45-21 MCG/ACT inhaler, Inhale 2 puffs into the lungs 2 (two) times daily. (Patient not taking: Reported on 02/18/2021), Disp: 1 each, Rfl: 12   Allergies  Allergen Reactions   Farxiga [Dapagliflozin]     Yeast infections   Sulfa Antibiotics Hives and Itching    welts   Past Medical History:  Diagnosis Date   Anxiety    Arthritis    osteoarthritis   Asthma    Diabetes mellitus without complication ([place])    Hypercholesteremia    Hypertension     Past Surgical History:  Procedure Laterality Date   CATARACT EXTRACTION W/PHACO Left 08/18/2016   Procedure: CATARACT EXTRACTION PHACO AND INTRAOCULAR LENS PLACEMENT ([place]);  Surgeon: Rutherford Guys, MD;  Location: [facility];  Service: Ophthalmology;  Laterality: Left;  CDE: 6.05   CATARACT EXTRACTION W/PHACO Left 09/01/2016   Procedure: REPOSITIONING OF LEFT IOL LENS;  Surgeon: Rutherford Guys, MD;  Location: [facility];  Service: Ophthalmology;  Laterality: Left;   CATARACT EXTRACTION W/PHACO Right 10/06/2016   Procedure: CATARACT EXTRACTION PHACO AND INTRAOCULAR LENS PLACEMENT (IOC);  Surgeon: Rutherford Guys, MD;  Location: [facility];  Service: Ophthalmology;  Laterality: Right;  CDE: 6.21    Social History   Socioeconomic History   Marital status: Married    Spouse name: Sonia Side   Number of children: 0   Years of education: Not on file   Highest education level: Not on file  Occupational History   Not on file  Tobacco Use   Smoking status: Former    Packs/day: 0.25    Years: 20.00    Pack years: 5.00    Types: Cigarettes    Quit date: 08/13/1980    Years since quitting: 40.5   Smokeless tobacco: Never  Substance and Sexual Activity   Alcohol use: No   Drug use: No   Sexual activity: Yes    Birth control/protection: Post-menopausal  Other Topics  Concern   Not on file  Social History Narrative   Lives home with her husband. Works out at [employer] 60 min 3x per week and walks 30 minutes other days.   Social Determinants of Health   Financial Resource Strain: Low Risk    Difficulty of Paying Living Expenses: Not hard at all  Food Insecurity: No Food Insecurity   Worried About Charity fundraiser in the Last Year: Never true   [place] in the Last Year: Never true  Transportation Needs: Unknown   Lack of Transportation (Medical): No   Lack of Transportation (Non-Medical): Not on file  Physical Activity: Sufficiently Active   Days of Exercise per Week: 7 days   Minutes of Exercise per Session: 60 min  Stress: No Stress Concern Present   Feeling of Stress :  Not at all  Social Connections: Socially Integrated   Frequency of Communication with Friends and Family: More than three times a week   Frequency of Social Gatherings with Friends and Family: More than three times a week   Attends Religious Services: More than 4 times per year   Active Member of [employer] or Organizations: Yes   Attends Music therapist: More than 4 times per year   Marital Status: Married  Human resources officer Violence: Not At Risk   Fear of Current or Ex-Partner: No   Emotionally Abused: No   Physically Abused: No   Sexually Abused: No        Objective:    BP 135/79   Pulse 99   Temp (!) 97 F (36.1 C) (Temporal)   Resp 20   Ht '5\' 3"'  (1.6 m)   Wt 170 lb (77.1 kg)   SpO2 95%   BMI 30.11 kg/m   Wt Readings from Last 3 Encounters:  02/18/21 170 lb (77.1 kg)  11/14/20 174 lb 9.6 oz (79.2 kg)  10/03/20 174 lb (78.9 kg)    Physical Exam Vitals reviewed.  Constitutional:      General: She is not in acute distress.    Appearance: Normal appearance. She is obese. She is not ill-appearing, toxic-appearing or diaphoretic.  HENT:     Head: Normocephalic and atraumatic.  Eyes:     General: No scleral icterus.       Right eye: No  discharge.        Left eye: No discharge.     Conjunctiva/sclera: Conjunctivae normal.  Cardiovascular:     Rate and Rhythm: Normal rate and regular rhythm.     Heart sounds: Normal heart sounds. No murmur heard.   No friction rub. No gallop.  Pulmonary:     Effort: Pulmonary effort is normal. No respiratory distress.     Breath sounds: Normal breath sounds. No stridor. No wheezing, rhonchi or rales.  Musculoskeletal:        General: Normal range of motion.     Cervical back: Normal range of motion.  Skin:    General: Skin is warm and dry.     Capillary Refill: Capillary refill takes less than 2 seconds.  Neurological:     General: No focal deficit present.     Mental Status: She is alert and oriented to person, place, and time. Mental status is at baseline.  Psychiatric:        Mood and Affect: Mood normal.        Behavior: Behavior normal.        Thought Content: Thought content normal.        Judgment: Judgment normal.    No results found for: TSH Lab Results  Component Value Date   WBC 9.0 11/14/2020   HGB 12.7 11/14/2020   HCT 37.7 11/14/2020   MCV 84 11/14/2020   PLT 430 11/14/2020   Lab Results  Component Value Date   NA 142 11/14/2020   K 5.1 11/14/2020   CO2 29 11/14/2020   GLUCOSE 153 (H) 11/14/2020   BUN 11 11/14/2020   CREATININE 1.07 (H) 11/14/2020   BILITOT 0.4 11/14/2020   ALKPHOS 96 11/14/2020   AST 9 11/14/2020   ALT 7 11/14/2020   PROT 6.6 11/14/2020   ALBUMIN 4.4 11/14/2020   CALCIUM 10.0 11/14/2020   ANIONGAP 12 08/13/2016   EGFR 56 (L) 11/14/2020   Lab Results  Component Value Date   CHOL 160  11/14/2020   Lab Results  Component Value Date   HDL 67 11/14/2020   Lab Results  Component Value Date   LDLCALC 68 11/14/2020   Lab Results  Component Value Date   TRIG 146 11/14/2020   Lab Results  Component Value Date   CHOLHDL 2.4 11/14/2020   Lab Results  Component Value Date   HGBA1C 7.9 (H) 11/14/2020

## 2021-02-19 LAB — CBC WITH DIFFERENTIAL/PLATELET
Basophils Absolute: 0.1 10*3/uL (ref 0.0–0.2)
Basos: 1 %
EOS (ABSOLUTE): 0.4 10*3/uL (ref 0.0–0.4)
Eos: 4 %
Hematocrit: 40.7 % (ref 34.0–46.6)
Hemoglobin: 13.1 g/dL (ref 11.1–15.9)
Immature Grans (Abs): 0 10*3/uL (ref 0.0–0.1)
Immature Granulocytes: 0 %
Lymphocytes Absolute: 2.5 10*3/uL (ref 0.7–3.1)
Lymphs: 25 %
MCH: 27.2 pg (ref 26.6–33.0)
MCHC: 32.2 g/dL (ref 31.5–35.7)
MCV: 84 fL (ref 79–97)
Monocytes Absolute: 0.6 10*3/uL (ref 0.1–0.9)
Monocytes: 6 %
Neutrophils Absolute: 6.6 10*3/uL (ref 1.4–7.0)
Neutrophils: 64 %
Platelets: 455 10*3/uL — ABNORMAL HIGH (ref 150–450)
RBC: 4.82 x10E6/uL (ref 3.77–5.28)
RDW: 13.2 % (ref 11.7–15.4)
WBC: 10.1 10*3/uL (ref 3.4–10.8)

## 2021-02-19 LAB — CMP14+EGFR
ALT: 9 IU/L (ref 0–32)
AST: 11 IU/L (ref 0–40)
Albumin/Globulin Ratio: 2.3 — ABNORMAL HIGH (ref 1.2–2.2)
Albumin: 4.8 g/dL (ref 3.8–4.8)
Alkaline Phosphatase: 77 IU/L (ref 44–121)
BUN/Creatinine Ratio: 10 — ABNORMAL LOW (ref 12–28)
BUN: 10 mg/dL (ref 8–27)
Bilirubin Total: 0.5 mg/dL (ref 0.0–1.2)
CO2: 18 mmol/L — ABNORMAL LOW (ref 20–29)
Calcium: 9.9 mg/dL (ref 8.7–10.3)
Chloride: 101 mmol/L (ref 96–106)
Creatinine, Ser: 1.02 mg/dL — ABNORMAL HIGH (ref 0.57–1.00)
Globulin, Total: 2.1 g/dL (ref 1.5–4.5)
Glucose: 170 mg/dL — ABNORMAL HIGH (ref 70–99)
Potassium: 4.2 mmol/L (ref 3.5–5.2)
Sodium: 141 mmol/L (ref 134–144)
Total Protein: 6.9 g/dL (ref 6.0–8.5)
eGFR: 59 mL/min/{1.73_m2} — ABNORMAL LOW (ref >59)

## 2021-02-19 LAB — MICROALBUMIN / CREATININE URINE RATIO
Creatinine, Urine: 49.6 mg/dL
Microalb/Creat Ratio: 43 mg/g creat — ABNORMAL HIGH (ref 0–29)
Microalbumin, Urine: 21.5 ug/mL

## 2021-02-19 LAB — LIPID PANEL
Chol/HDL Ratio: 2.6 ratio (ref 0.0–4.4)
Cholesterol, Total: 163 mg/dL (ref 100–199)
HDL: 63 mg/dL (ref >39)
LDL Chol Calc (NIH): 70 mg/dL (ref 0–99)
Triglycerides: 181 mg/dL — ABNORMAL HIGH (ref 0–149)
VLDL Cholesterol Cal: 30 mg/dL (ref 5–40)

## 2021-02-19 NOTE — Telephone Encounter (Signed)
Noted. Patient was unsure.

## 2021-02-25 ENCOUNTER — Encounter: Payer: Self-pay | Admitting: Family Medicine

## 2021-02-25 DIAGNOSIS — E1165 Type 2 diabetes mellitus with hyperglycemia: Secondary | ICD-10-CM

## 2021-02-28 ENCOUNTER — Telehealth: Payer: Self-pay

## 2021-02-28 NOTE — Chronic Care Management (AMB) (Signed)
  Chronic Care Management   Note  02/28/2021 Name: RYNA BECKSTROM MRN: [id number] DOB: 10-01-1950  JENNIPHER WEATHERHOLTZ is a 70 y.o. year old female who is a primary care patient of Loman Brooklyn, FNP. I reached out to [name] by phone today in response to a referral sent by Ms. [place] PCP.  Ms. Fairbairn was given information about Chronic Care Management services today including:  CCM service includes personalized support from designated clinical staff supervised by her physician, including individualized plan of care and coordination with other care providers 24/7 contact phone numbers for assistance for urgent and routine care needs. Service will only be billed when office clinical staff spend 20 minutes or more in a month to coordinate care. Only one practitioner may furnish and bill the service in a calendar month. The patient may stop CCM services at any time (effective at the end of the month) by phone call to the office staff. The patient is responsible for co-pay (up to 20% after annual deductible is met) if co-pay is required by the individual health plan.   Patient agreed to services and verbal consent obtained.   Follow up plan: Telephone appointment with care management team member scheduled for:03/25/2021  Noreene Larsson, [place], [place], [place] [address] Direct Dial: [phone] [email or web address] Website: [email or web address]

## 2021-02-28 NOTE — Chronic Care Management (AMB) (Signed)
  Chronic Care Management   Outreach Note  02/28/2021 Name: [name] MRN: [id number] DOB: Sep 05, 1950  Jeanne Medina is a 70 y.o. year old female who is a primary care patient of Gwenlyn Fudge, FNP. I reached out to [name] by phone today in response to a referral sent by [name] primary care provider.  An unsuccessful telephone outreach was attempted today. The patient was referred to the case management team for assistance with care management and care coordination.   Follow Up Plan: A HIPAA compliant phone message was left for the patient providing contact information and requesting a return call.  The care management team will reach out to the patient again over the next 7 days.  If patient returns call to provider office, please advise to call Embedded Care Management Care Guide Penne Lash * at [phone]*  Penne Lash, RMA Care Guide, Embedded Care Coordination [facility]  [place], [place] [address] Direct Dial: [phone] [email or web address] Website: [email or web address]

## 2021-03-11 ENCOUNTER — Encounter: Payer: Self-pay | Admitting: Family Medicine

## 2021-03-11 ENCOUNTER — Ambulatory Visit (INDEPENDENT_AMBULATORY_CARE_PROVIDER_SITE_OTHER): Payer: Medicare Other | Admitting: Family Medicine

## 2021-03-11 DIAGNOSIS — J988 Other specified respiratory disorders: Secondary | ICD-10-CM | POA: Diagnosis not present

## 2021-03-11 DIAGNOSIS — B9689 Other specified bacterial agents as the cause of diseases classified elsewhere: Secondary | ICD-10-CM | POA: Diagnosis not present

## 2021-03-11 MED ORDER — AMOXICILLIN-POT CLAVULANATE 875-125 MG PO TABS: 1.0000 | ORAL_TABLET | Freq: Two times a day (BID) | ORAL | 0 refills | Status: DC

## 2021-03-11 MED ORDER — METHYLPREDNISOLONE 4 MG PO TBPK: ORAL_TABLET | ORAL | 0 refills | Status: DC

## 2021-03-11 MED ORDER — IPRATROPIUM BROMIDE 0.03 % NA SOLN: 2.0000 | Freq: Two times a day (BID) | NASAL | 0 refills | Status: DC

## 2021-03-11 NOTE — Progress Notes (Signed)
Virtual Visit via Telephone Note  I connected with Jeanne Medina on 03/11/21 at 9:57 AM by telephone and verified that I am speaking with the correct person using two identifiers. Jeanne Medina is currently located at home and nobody is currently with her during this visit. The provider, Gwenlyn Fudge, FNP is located in their office at time of visit.  I discussed the limitations, risks, security and privacy concerns of performing an evaluation and management service by telephone and the availability of in person appointments. I also discussed with the patient that there may be a patient responsible charge related to this service. The patient expressed understanding and agreed to proceed.  Subjective: PCP: Gwenlyn Fudge, FNP  Chief Complaint  Patient presents with   URI   Patient complains of cough, head/chest congestion, headache, sore throat, facial pain/pressure, postnasal drainage, shortness of breath, wheezing, nausea, vomiting, and diarrhea. Onset of symptoms was 8 days ago, gradually worsening since that time. She is drinking plenty of fluids. Evaluation to date: at home COVID test negative. Treatment to date:  Benadryl, Advair, cough suppressant . She has a history of asthma. She does not smoke.    ROS: Per HPI  Current Outpatient Medications:    albuterol (VENTOLIN HFA) 108 (90 Base) MCG/ACT inhaler, Inhale 2 puffs into the lungs every 6 (six) hours as needed for wheezing or shortness of breath., Disp: 18 g, Rfl: 2   amLODipine (NORVASC) 10 MG tablet, Take 1 tablet (10 mg total) by mouth daily., Disp: 90 tablet, Rfl: 1   citalopram (CELEXA) 40 MG tablet, Take 1 tablet (40 mg total) by mouth daily., Disp: 90 tablet, Rfl: 1   diphenhydrAMINE (BENADRYL) 25 MG tablet, Take 25 mg by mouth at bedtime as needed (for allergies.)., Disp: , Rfl:    EQ LORATADINE 10 MG tablet, Take 1 tablet by mouth once daily, Disp: 90 tablet, Rfl: 2   fluticasone (FLONASE) 50 MCG/ACT nasal spray,  Use 1 spray(s) in each nostril twice daily, Disp: 48 g, Rfl: 0   fluticasone-salmeterol (ADVAIR HFA) 45-21 MCG/ACT inhaler, Inhale 2 puffs into the lungs 2 (two) times daily. (Patient not taking: Reported on 02/18/2021), Disp: 1 each, Rfl: 12   L-Lysine 500 MG CAPS, Take by mouth., Disp: , Rfl:    lisinopril-hydrochlorothiazide (ZESTORETIC) 20-25 MG tablet, Take 1 tablet by mouth daily., Disp: 90 tablet, Rfl: 1   lovastatin (MEVACOR) 40 MG tablet, Take 1 tablet (40 mg total) by mouth at bedtime., Disp: 90 tablet, Rfl: 1   metFORMIN (GLUCOPHAGE) 500 MG tablet, Take 2 tablets (1,000 mg total) by mouth 2 (two) times daily., Disp: 360 tablet, Rfl: 1   Semaglutide (RYBELSUS) 14 MG TABS, Take 14 mg by mouth daily., Disp: 90 tablet, Rfl: 3   traZODone (DESYREL) 50 MG tablet, TAKE 1 TABLET BY MOUTH AT BEDTIME AS NEEDED FOR SLEEP, Disp: 90 tablet, Rfl: 1   triamcinolone (KENALOG) 0.1 %, Apply 1 application topically 2 (two) times daily., Disp: 80 g, Rfl: 2  Allergies  Allergen Reactions   Farxiga [Dapagliflozin]     Yeast infections   Sulfa Antibiotics Hives and Itching    welts   Past Medical History:  Diagnosis Date   Anxiety    Arthritis    osteoarthritis   Asthma    Diabetes mellitus without complication (HCC)    Hypercholesteremia    Hypertension     Observations/Objective: A&O  No respiratory distress or wheezing audible over the phone Mood, judgement, and  thought processes all WNL  Assessment and Plan: 1. Bacterial respiratory infection Continue symptom management.  - amoxicillin-clavulanate (AUGMENTIN) 875-125 MG tablet; Take 1 tablet by mouth 2 (two) times daily for 7 days.  Dispense: 14 tablet; Refill: 0 - methylPREDNISolone (MEDROL DOSEPAK) 4 MG TBPK tablet; Use as directed.  Dispense: 21 each; Refill: 0 - ipratropium (ATROVENT) 0.03 % nasal spray; Place 2 sprays into both nostrils every 12 (twelve) hours for 3 days.  Dispense: 30 mL; Refill: 0   Follow Up  Instructions:  I discussed the assessment and treatment plan with the patient. The patient was provided an opportunity to ask questions and all were answered. The patient agreed with the plan and demonstrated an understanding of the instructions.   The patient was advised to call back or seek an in-person evaluation if the symptoms worsen or if the condition fails to improve as anticipated.  The above assessment and management plan was discussed with the patient. The patient verbalized understanding of and has agreed to the management plan. Patient is aware to call the clinic if symptoms persist or worsen. Patient is aware when to return to the clinic for a follow-up visit. Patient educated on when it is appropriate to go to the emergency department.   Time call ended: 10:08 AM  I provided 11 minutes of non-face-to-face time during this encounter.  Deliah Boston, MSN, APRN, FNP-C [facility] 03/11/21

## 2021-03-17 ENCOUNTER — Encounter (HOSPITAL_COMMUNITY): Payer: Self-pay | Admitting: Emergency Medicine

## 2021-03-17 ENCOUNTER — Emergency Department (HOSPITAL_COMMUNITY): Payer: Medicare Other

## 2021-03-17 ENCOUNTER — Other Ambulatory Visit: Payer: Self-pay

## 2021-03-17 ENCOUNTER — Inpatient Hospital Stay (HOSPITAL_COMMUNITY)
Admission: EM | Admit: 2021-03-17 | Discharge: 2021-03-17 | DRG: 871 | Discharge status: Against Medical Advice | Payer: Medicare Other | Attending: Internal Medicine | Admitting: Internal Medicine

## 2021-03-17 DIAGNOSIS — Z8261 Family history of arthritis: Secondary | ICD-10-CM

## 2021-03-17 DIAGNOSIS — Z8616 Personal history of COVID-19: Secondary | ICD-10-CM | POA: Diagnosis not present

## 2021-03-17 DIAGNOSIS — Z823 Family history of stroke: Secondary | ICD-10-CM

## 2021-03-17 DIAGNOSIS — Z5329 Procedure and treatment not carried out because of patient's decision for other reasons: Secondary | ICD-10-CM | POA: Diagnosis not present

## 2021-03-17 DIAGNOSIS — Z87891 Personal history of nicotine dependence: Secondary | ICD-10-CM | POA: Diagnosis not present

## 2021-03-17 DIAGNOSIS — J4520 Mild intermittent asthma, uncomplicated: Secondary | ICD-10-CM

## 2021-03-17 DIAGNOSIS — N179 Acute kidney failure, unspecified: Secondary | ICD-10-CM | POA: Diagnosis present

## 2021-03-17 DIAGNOSIS — T502X5A Adverse effect of carbonic-anhydrase inhibitors, benzothiadiazides and other diuretics, initial encounter: Secondary | ICD-10-CM | POA: Diagnosis present

## 2021-03-17 DIAGNOSIS — Z800 Family history of malignant neoplasm of digestive organs: Secondary | ICD-10-CM | POA: Diagnosis not present

## 2021-03-17 DIAGNOSIS — Z79899 Other long term (current) drug therapy: Secondary | ICD-10-CM

## 2021-03-17 DIAGNOSIS — B9689 Other specified bacterial agents as the cause of diseases classified elsewhere: Secondary | ICD-10-CM

## 2021-03-17 DIAGNOSIS — Z888 Allergy status to other drugs, medicaments and biological substances status: Secondary | ICD-10-CM

## 2021-03-17 DIAGNOSIS — J45909 Unspecified asthma, uncomplicated: Secondary | ICD-10-CM | POA: Diagnosis not present

## 2021-03-17 DIAGNOSIS — E869 Volume depletion, unspecified: Secondary | ICD-10-CM | POA: Diagnosis not present

## 2021-03-17 DIAGNOSIS — Z8249 Family history of ischemic heart disease and other diseases of the circulatory system: Secondary | ICD-10-CM

## 2021-03-17 DIAGNOSIS — Z882 Allergy status to sulfonamides status: Secondary | ICD-10-CM

## 2021-03-17 DIAGNOSIS — Z833 Family history of diabetes mellitus: Secondary | ICD-10-CM | POA: Diagnosis not present

## 2021-03-17 DIAGNOSIS — A419 Sepsis, unspecified organism: Principal | ICD-10-CM | POA: Diagnosis present

## 2021-03-17 DIAGNOSIS — I10 Essential (primary) hypertension: Secondary | ICD-10-CM | POA: Diagnosis not present

## 2021-03-17 DIAGNOSIS — J181 Lobar pneumonia, unspecified organism: Secondary | ICD-10-CM

## 2021-03-17 DIAGNOSIS — R6520 Severe sepsis without septic shock: Secondary | ICD-10-CM | POA: Diagnosis not present

## 2021-03-17 DIAGNOSIS — E871 Hypo-osmolality and hyponatremia: Secondary | ICD-10-CM | POA: Diagnosis not present

## 2021-03-17 DIAGNOSIS — E119 Type 2 diabetes mellitus without complications: Secondary | ICD-10-CM | POA: Diagnosis present

## 2021-03-17 DIAGNOSIS — J189 Pneumonia, unspecified organism: Secondary | ICD-10-CM | POA: Diagnosis present

## 2021-03-17 DIAGNOSIS — R059 Cough, unspecified: Secondary | ICD-10-CM | POA: Diagnosis not present

## 2021-03-17 DIAGNOSIS — J988 Other specified respiratory disorders: Secondary | ICD-10-CM

## 2021-03-17 LAB — CBC WITH DIFFERENTIAL/PLATELET
Abs Immature Granulocytes: 0.87 10*3/uL — ABNORMAL HIGH (ref 0.00–0.07)
Basophils Absolute: 0.1 10*3/uL (ref 0.0–0.1)
Basophils Relative: 1 %
Eosinophils Absolute: 0.1 10*3/uL (ref 0.0–0.5)
Eosinophils Relative: 0 %
HCT: 36 % (ref 36.0–46.0)
Hemoglobin: 12.5 g/dL (ref 12.0–15.0)
Immature Granulocytes: 5 %
Lymphocytes Relative: 8 %
Lymphs Abs: 1.6 10*3/uL (ref 0.7–4.0)
MCH: 28.4 pg (ref 26.0–34.0)
MCHC: 34.7 g/dL (ref 30.0–36.0)
MCV: 81.8 fL (ref 80.0–100.0)
Monocytes Absolute: 1.2 10*3/uL — ABNORMAL HIGH (ref 0.1–1.0)
Monocytes Relative: 6 %
Neutro Abs: 15.6 10*3/uL — ABNORMAL HIGH (ref 1.7–7.7)
Neutrophils Relative %: 80 %
Platelets: 608 10*3/uL — ABNORMAL HIGH (ref 150–400)
RBC: 4.4 MIL/uL (ref 3.87–5.11)
RDW: 13.1 % (ref 11.5–15.5)
WBC: 19.4 10*3/uL — ABNORMAL HIGH (ref 4.0–10.5)
nRBC: 0 % (ref 0.0–0.2)

## 2021-03-17 LAB — URINALYSIS, ROUTINE W REFLEX MICROSCOPIC
Bilirubin Urine: NEGATIVE
Glucose, UA: 100 mg/dL — AB
Ketones, ur: NEGATIVE mg/dL
Leukocytes,Ua: NEGATIVE
Nitrite: NEGATIVE
Protein, ur: NEGATIVE mg/dL
Specific Gravity, Urine: 1.01 (ref 1.005–1.030)
pH: 7.5 (ref 5.0–8.0)

## 2021-03-17 LAB — URINALYSIS, MICROSCOPIC (REFLEX)

## 2021-03-17 LAB — LACTIC ACID, PLASMA
Lactic Acid, Venous: 3.2 mmol/L (ref 0.5–1.9)
Lactic Acid, Venous: 2.4 mmol/L (ref 0.5–1.9)

## 2021-03-17 LAB — BASIC METABOLIC PANEL
Anion gap: 12 (ref 5–15)
BUN: 28 mg/dL — ABNORMAL HIGH (ref 8–23)
CO2: 27 mmol/L (ref 22–32)
Calcium: 9.7 mg/dL (ref 8.9–10.3)
Chloride: 87 mmol/L — ABNORMAL LOW (ref 98–111)
Creatinine, Ser: 2.65 mg/dL — ABNORMAL HIGH (ref 0.44–1.00)
GFR, Estimated: 19 mL/min — ABNORMAL LOW (ref >60)
Glucose, Bld: 184 mg/dL — ABNORMAL HIGH (ref 70–99)
Potassium: 3.8 mmol/L (ref 3.5–5.1)
Sodium: 126 mmol/L — ABNORMAL LOW (ref 135–145)

## 2021-03-17 LAB — RESP PANEL BY RT-PCR (FLU A&B, COVID) ARPGX2
Influenza A by PCR: NEGATIVE
Influenza B by PCR: NEGATIVE
SARS Coronavirus 2 by RT PCR: NEGATIVE

## 2021-03-17 LAB — APTT: aPTT: 25 seconds (ref 24–36)

## 2021-03-17 LAB — PROTIME-INR
INR: 0.9 (ref 0.8–1.2)
Prothrombin Time: 12.5 seconds (ref 11.4–15.2)

## 2021-03-17 LAB — CBG MONITORING, ED: Glucose-Capillary: 161 mg/dL — ABNORMAL HIGH (ref 70–99)

## 2021-03-17 MED ORDER — LACTATED RINGERS IV BOLUS
1000.0000 mL | Freq: Once | INTRAVENOUS | Status: AC
  Administered 2021-03-17: 1000 mL via INTRAVENOUS

## 2021-03-17 MED ORDER — ALBUTEROL SULFATE HFA 108 (90 BASE) MCG/ACT IN AERS
2.0000 | INHALATION_SPRAY | Freq: Once | RESPIRATORY_TRACT | Status: AC
  Administered 2021-03-17: 2 via RESPIRATORY_TRACT

## 2021-03-17 MED ORDER — AMOXICILLIN-POT CLAVULANATE 875-125 MG PO TABS: 1.0000 | ORAL_TABLET | Freq: Two times a day (BID) | ORAL | 0 refills | Status: DC

## 2021-03-17 MED ORDER — SODIUM CHLORIDE 0.9 % IV SOLN
500.0000 mg | INTRAVENOUS | Status: DC
  Administered 2021-03-17: 500 mg via INTRAVENOUS
  Filled 2021-03-17: qty 500

## 2021-03-17 MED ORDER — SODIUM CHLORIDE 0.9 % IV SOLN
2.0000 g | INTRAVENOUS | Status: DC
  Administered 2021-03-17: 2 g via INTRAVENOUS
  Filled 2021-03-17: qty 20

## 2021-03-17 MED ORDER — ALBUTEROL SULFATE HFA 108 (90 BASE) MCG/ACT IN AERS: 2.0000 | INHALATION_SPRAY | Freq: Four times a day (QID) | RESPIRATORY_TRACT | 2 refills | Status: DC | PRN

## 2021-03-17 MED ORDER — GUAIFENESIN ER 600 MG PO TB12: 600.0000 mg | ORAL_TABLET | Freq: Two times a day (BID) | ORAL | 2 refills | Status: DC

## 2021-03-17 MED ORDER — ALBUTEROL SULFATE HFA 108 (90 BASE) MCG/ACT IN AERS
INHALATION_SPRAY | RESPIRATORY_TRACT | Status: AC
  Filled 2021-03-17: qty 6.7

## 2021-03-17 MED ORDER — LACTATED RINGERS IV BOLUS (SEPSIS)
1000.0000 mL | Freq: Once | INTRAVENOUS | Status: AC
  Administered 2021-03-17: 1000 mL via INTRAVENOUS

## 2021-03-17 MED ORDER — LACTATED RINGERS IV SOLN: INTRAVENOUS | Status: DC

## 2021-03-17 MED ORDER — ALBUTEROL SULFATE (2.5 MG/3ML) 0.083% IN NEBU: 2.5000 mg | INHALATION_SOLUTION | Freq: Once | RESPIRATORY_TRACT | Status: DC

## 2021-03-17 NOTE — Consult Note (Signed)
Medical Consultation   Jeanne Medina  DQQ:[id number]  DOB: [birth year]  DOA: 03/17/2021  PCP: Gwenlyn Fudge, FNP    Requesting physician: Delice Bison, ED PA  Reason for consultation: Pneumonia   History of Present Illness: Jeanne Medina is an 70 y.o. female with a history of hypertension, diabetes, hyperlipidemia, asthma, who presents to the hospital with persistent cough, congestion.  Patient was not hypoxic and was afebrile.  She was seen by her primary care physician and was prescribed a course of Augmentin approximately a week ago.  She had completed about half of this course, but has been taking antibiotics intermittently and not on a daily basis.  She also received a Medrol Dosepak.  She did endorse vomiting, wheezing, cough and sore throat.  Work-up in the emergency room showed elevated WBC count, lactic acid, acute kidney injury with elevated creatinine and potential pneumonia on chest x-ray.  She was referred for admission.      Review of Systems:  ROS As per HPI otherwise 10 point review of systems negative.     Past Medical History: Past Medical History:  Diagnosis Date   Anxiety    Arthritis    osteoarthritis   Asthma    Diabetes mellitus without complication (HCC)    Hypercholesteremia    Hypertension     Past Surgical History: Past Surgical History:  Procedure Laterality Date   CATARACT EXTRACTION W/PHACO Left 08/18/2016   Procedure: CATARACT EXTRACTION PHACO AND INTRAOCULAR LENS PLACEMENT (IOC);  Surgeon: Jethro Bolus, MD;  Location: [facility];  Service: Ophthalmology;  Laterality: Left;  CDE: 6.05   CATARACT EXTRACTION W/PHACO Left 09/01/2016   Procedure: REPOSITIONING OF LEFT IOL LENS;  Surgeon: Jethro Bolus, MD;  Location: [facility];  Service: Ophthalmology;  Laterality: Left;   CATARACT EXTRACTION W/PHACO Right 10/06/2016   Procedure: CATARACT EXTRACTION PHACO AND INTRAOCULAR LENS PLACEMENT (IOC);  Surgeon: Jethro Bolus, MD;   Location: [facility];  Service: Ophthalmology;  Laterality: Right;  CDE: 6.21     Allergies:   Allergies  Allergen Reactions   Farxiga [Dapagliflozin]     Yeast infections   Sulfa Antibiotics Hives and Itching    welts     Social History:  reports that she quit smoking about 40 years ago. Her smoking use included cigarettes. She has a 5.00 pack-year smoking history. She has never used smokeless tobacco. She reports that she does not drink alcohol and does not use drugs.   Family History: Family History  Problem Relation Age of Onset   Heart failure Mother    Hypertension Mother    Heart disease Mother    Stroke Father    Heart disease Father    Hypertension Father    Dementia Sister    Gallbladder disease Brother    Pancreatic cancer Brother    Heart attack Brother    Rheum arthritis Sister    Heart disease Sister    Cancer Sister        tonsils   Diabetes Sister    Diabetes Sister    Heart disease Sister    Rheum arthritis Sister     Family history reviewed and not pertinent    Physical Exam: Vitals:   03/17/21 1445 03/17/21 1500 03/17/21 1515 03/17/21 1745  BP:  (!) 128/50 (!) 141/60 (!) 142/65  Pulse: 98 98 (!) 106 (!) 101  Resp: 15 15  (!) 23  Temp:  97.8 F (36.6 C)  TempSrc:    Oral  SpO2: 97% 95% 96% 94%  Weight:      Height:        Constitutional: Alert and awake, oriented x3, not in any acute distress. Eyes: PERLA, EOMI, irises appear normal, anicteric sclera,  ENMT: external ears and nose appear normal,             Lips appears normal, oropharynx mucosa, tongue, posterior pharynx appear normal  Neck: neck appears normal, no masses, normal ROM, no thyromegaly, no JVD  CVS: S1-S2 clear, no murmur rubs or gallops, no LE edema, normal pedal pulses  Respiratory:  clear to auscultation bilaterally, no wheezing, rales or rhonchi. Respiratory effort normal. No accessory muscle use.  Abdomen: soft nontender, nondistended, normal bowel sounds, no  hepatosplenomegaly, no hernias  Musculoskeletal: : no cyanosis, clubbing or edema noted bilaterally Neuro: Cranial nerves II-XII intact, strength, sensation, reflexes Psych: judgement and insight appear normal, stable mood and affect, mental status Skin: no rashes or lesions or ulcers, no induration or nodules    Data reviewed:  I have personally reviewed following labs and imaging studies Labs:  CBC: Recent Labs  Lab 03/17/21 0846  WBC 19.4*  NEUTROABS 15.6*  HGB 12.5  HCT 36.0  MCV 81.8  PLT 608*    Basic Metabolic Panel: Recent Labs  Lab 03/17/21 0846  NA 126*  K 3.8  CL 87*  CO2 27  GLUCOSE 184*  BUN 28*  CREATININE 2.65*  CALCIUM 9.7   GFR Estimated Creatinine Clearance: 19.4 mL/min (A) (by C-G formula based on SCr of 2.65 mg/dL (H)). Liver Function Tests: No results for input(s): AST, ALT, ALKPHOS, BILITOT, PROT, ALBUMIN in the last 168 hours. No results for input(s): LIPASE, AMYLASE in the last 168 hours. No results for input(s): AMMONIA in the last 168 hours. Coagulation profile Recent Labs  Lab 03/17/21 0846  INR 0.9    Cardiac Enzymes: No results for input(s): CKTOTAL, CKMB, CKMBINDEX, TROPONINI in the last 168 hours. BNP: Invalid input(s): POCBNP CBG: Recent Labs  Lab 03/17/21 1144  GLUCAP 161*   D-Dimer No results for input(s): DDIMER in the last 72 hours. Hgb A1c No results for input(s): HGBA1C in the last 72 hours. Lipid Profile No results for input(s): CHOL, HDL, LDLCALC, TRIG, CHOLHDL, LDLDIRECT in the last 72 hours. Thyroid function studies No results for input(s): TSH, T4TOTAL, T3FREE, THYROIDAB in the last 72 hours.  Invalid input(s): FREET3 Anemia work up No results for input(s): VITAMINB12, FOLATE, FERRITIN, TIBC, IRON, RETICCTPCT in the last 72 hours. Urinalysis    Component Value Date/Time   COLORURINE YELLOW 03/17/2021 1109   APPEARANCEUR CLEAR 03/17/2021 1109   LABSPEC 1.010 03/17/2021 1109   PHURINE 7.5 03/17/2021  1109   GLUCOSEU 100 (A) 03/17/2021 1109   HGBUR TRACE (A) 03/17/2021 1109   BILIRUBINUR NEGATIVE 03/17/2021 1109   KETONESUR NEGATIVE 03/17/2021 1109   PROTEINUR NEGATIVE 03/17/2021 1109   NITRITE NEGATIVE 03/17/2021 1109   LEUKOCYTESUR NEGATIVE 03/17/2021 1109     Microbiology Recent Results (from the past 240 hour(s))  Blood Culture (routine x 2)     Status: None (Preliminary result)   Collection Time: 03/17/21  9:42 AM   Specimen: Peripheral; Blood  Result Value Ref Range Status   Specimen Description LEFT ANTECUBITAL  Final   Special Requests   Final    BOTTLES DRAWN AEROBIC AND ANAEROBIC Blood Culture adequate volume Performed at [facility], [address], [place], [place] [address]  Culture PENDING  Incomplete   Report Status PENDING  Incomplete  Blood Culture (routine x 2)     Status: None (Preliminary result)   Collection Time: 03/17/21  9:43 AM   Specimen: Peripheral; Blood  Result Value Ref Range Status   Specimen Description RIGHT ANTECUBITAL  Final   Special Requests   Final    BOTTLES DRAWN AEROBIC AND ANAEROBIC Blood Culture adequate volume Performed at [facility], [address], [place], [place] [address]    Culture PENDING  Incomplete   Report Status PENDING  Incomplete  Resp Panel by RT-PCR (Flu A&B, Covid) Nasopharyngeal Swab     Status: None   Collection Time: 03/17/21 12:49 PM   Specimen: Nasopharyngeal Swab; Nasopharyngeal(NP) swabs in vial transport medium  Result Value Ref Range Status   SARS Coronavirus 2 by RT PCR NEGATIVE NEGATIVE Final    Comment: (NOTE) SARS-CoV-2 target nucleic acids are NOT DETECTED.  The SARS-CoV-2 RNA is generally detectable in upper respiratory specimens during the acute phase of infection. The lowest concentration of SARS-CoV-2 viral copies this assay can detect is 138 copies/mL. A negative result does not preclude SARS-Cov-2 infection and should not be used as the sole basis for treatment or other patient  management decisions. A negative result may occur with  improper specimen collection/handling, submission of specimen other than nasopharyngeal swab, presence of viral mutation(s) within the areas targeted by this assay, and inadequate number of viral copies(<138 copies/mL). A negative result must be combined with clinical observations, patient history, and epidemiological information. The expected result is Negative.  Fact Sheet for Patients:  BloggerCourse.com  Fact Sheet for Healthcare Providers:  SeriousBroker.it  This test is no t yet approved or cleared by the [place] FDA and  has been authorized for detection and/or diagnosis of SARS-CoV-2 by FDA under an Emergency Use Authorization (EUA). This EUA will remain  in effect (meaning this test can be used) for the duration of the COVID-19 declaration under Section 564(b)(1) of the Act, 21 U.S.C.section 360bbb-3(b)(1), unless the authorization is terminated  or revoked sooner.       Influenza A by PCR NEGATIVE NEGATIVE Final   Influenza B by PCR NEGATIVE NEGATIVE Final    Comment: (NOTE) The Xpert Xpress SARS-CoV-2/FLU/RSV plus assay is intended as an aid in the diagnosis of influenza from Nasopharyngeal swab specimens and should not be used as a sole basis for treatment. Nasal washings and aspirates are unacceptable for Xpert Xpress SARS-CoV-2/FLU/RSV testing.  Fact Sheet for Patients: BloggerCourse.com  Fact Sheet for Healthcare Providers: SeriousBroker.it  This test is not yet approved or cleared by the [place] FDA and has been authorized for detection and/or diagnosis of SARS-CoV-2 by FDA under an Emergency Use Authorization (EUA). This EUA will remain in effect (meaning this test can be used) for the duration of the COVID-19 declaration under Section 564(b)(1) of the Act, 21 U.S.C. section 360bbb-3(b)(1),  unless the authorization is terminated or revoked.  Performed at [facility], [address], [place], [place] [address]        Inpatient Medications:   Scheduled Meds:  albuterol       Continuous Infusions:  azithromycin Stopped (03/17/21 1058)   cefTRIAXone (ROCEPHIN)  IV Stopped (03/17/21 1043)   lactated ringers Stopped (03/17/21 1748)     Radiological Exams on Admission: DG Chest Portable 1 View  Result Date: 03/17/2021 CLINICAL DATA:  70 year old female with cough and congestion for 2 weeks. Started on antibiotics and steroids. EXAM: PORTABLE CHEST  1 VIEW COMPARISON:  None. FINDINGS: Portable AP upright view at 0850 hours. Low normal lung volumes. Normal cardiac size and mediastinal contours. Visualized tracheal air column is within normal limits. Streaky opacity in the mid right lung including along the minor fissure. But elsewhere Allowing for portable technique the lungs are clear. No pleural effusion. No pulmonary edema. No acute osseous abnormality identified. Paucity of bowel gas in the upper abdomen. IMPRESSION: Streaky opacity in the mid right lung with no prior imaging. Nonspecific, but suspicious for bronchopneumonia in this setting. No pleural effusion. Electronically Signed   By: Odessa Fleming M.D.   On: 03/17/2021 09:09    Impression/Recommendations Principal Problem:   AKI (acute kidney injury) (HCC)  Acute kidney injury -Felt to be related to volume depletion in the setting of ACE inhibitor use -She was started on IV hydration -Continue to hold ACE inhibitor/hydrochlorothiazide  Hyponatremia -Related to volume depletion and HCTZ use -Continue to hold HCTZ, she received saline infusion  Pneumonia and sepsis -Started on IV antibiotics -Lactic acid elevated, trending down with IV fluids  Upon my assessment of the patient, she immediately tells me that she does not want to stay in the hospital and wishes to leave AGAINST MEDICAL ADVICE.  I did ask her to  reconsider considering her multiple acute medical issues and need for further IV antibiotics/IV fluids.  She says that she is able to get up and move around.  She does not wish to stay in the hospital overnight.  She was able to verbalize the risks of leaving the hospital at this time including worsening organ dysfunction and possibly death.  I advised her to continue her Augmentin course and extended out her antibiotic course for another few days.  I also refilled her albuterol inhaler and continue Mucinex.  I asked her to continue to hold her ACE inhibitor/hydrochlorothiazide and recommended continued oral hydration.  I also advised to return to the emergency room if you have worsening shortness of breath, high fevers, felt increasingly weak/dizzy.  Patient expressed understanding and decided to leave the hospital AGAINST MEDICAL ADVICE.     Time Spent: 40  Erick Blinks M.D. [facility] 03/17/2021, 6:09 PM

## 2021-03-17 NOTE — ED Provider Notes (Signed)
[facility] EMERGENCY DEPARTMENT Provider Note   CSN: [id number] Arrival date & time: 03/17/21  6283     History Chief Complaint  Patient presents with   Cough    CHARNIKA HERBST is a 70 y.o. female with medical history significant for asthma, DM 2, hypertension, hyperlipidemia who presents to the ED for ongoing cough and congestion for 2 weeks.  Patient states she saw her PCP 2 weeks ago for the same complaint and was diagnosed with a URI and placed on Augmentin and steroids.  Patient states whenever she takes her Augmentin, she vomits afterwards and has been having to cut it into 2 pieces in order to consume it.  Patient states she has stopped taking her steroids because they make her blood sugar increase up into the 300s.  Patient states since seeing her PCP, her symptoms have improved but she felt like because they had not completely subsided today she wanted to be seen.  Patient endorses vomiting after medication, wheezing, cough, postnasal drainage, sinus pressure, sore throat.  Patient denies nausea, shortness of breath, fever, diarrhea.  Patient does endorse a 15-year smoking history with 1 pack/day.   Cough Associated symptoms: rhinorrhea, sore throat and wheezing   Associated symptoms: no chest pain, no fever, no headaches and no shortness of breath       Past Medical History:  Diagnosis Date   Anxiety    Arthritis    osteoarthritis   Asthma    Diabetes mellitus without complication (HCC)    Hypercholesteremia    Hypertension     Patient Active Problem List   Diagnosis Date Noted   AKI (acute kidney injury) (HCC) 03/17/2021   Type 2 diabetes mellitus with hyperglycemia, without long-term current use of insulin (HCC) 05/16/2020   Difficulty sleeping 05/16/2020   History of COVID-19 05/16/2020   Atrophic vaginitis 11/04/2018   Mild intermittent asthma without complication 11/04/2018   Essential hypertension    Hypercholesteremia    Anxiety    Arthritis     Past  Surgical History:  Procedure Laterality Date   CATARACT EXTRACTION W/PHACO Left 08/18/2016   Procedure: CATARACT EXTRACTION PHACO AND INTRAOCULAR LENS PLACEMENT (IOC);  Surgeon: Jethro Bolus, MD;  Location: [facility];  Service: Ophthalmology;  Laterality: Left;  CDE: 6.05   CATARACT EXTRACTION W/PHACO Left 09/01/2016   Procedure: REPOSITIONING OF LEFT IOL LENS;  Surgeon: Jethro Bolus, MD;  Location: [facility];  Service: Ophthalmology;  Laterality: Left;   CATARACT EXTRACTION W/PHACO Right 10/06/2016   Procedure: CATARACT EXTRACTION PHACO AND INTRAOCULAR LENS PLACEMENT (IOC);  Surgeon: Jethro Bolus, MD;  Location: [facility];  Service: Ophthalmology;  Laterality: Right;  CDE: 6.21     OB History   No obstetric history on file.     Family History  Problem Relation Age of Onset   Heart failure Mother    Hypertension Mother    Heart disease Mother    Stroke Father    Heart disease Father    Hypertension Father    Dementia Sister    Gallbladder disease Brother    Pancreatic cancer Brother    Heart attack Brother    Rheum arthritis Sister    Heart disease Sister    Cancer Sister        tonsils   Diabetes Sister    Diabetes Sister    Heart disease Sister    Rheum arthritis Sister     Social History   Tobacco Use   Smoking status: Former  Packs/day: 0.25    Years: 20.00    Pack years: 5.00    Types: Cigarettes    Quit date: 08/13/1980    Years since quitting: 40.6   Smokeless tobacco: Never  Substance Use Topics   Alcohol use: No   Drug use: No    Home Medications Prior to Admission medications   Medication Sig Start Date End Date Taking? Authorizing Provider  albuterol (VENTOLIN HFA) 108 (90 Base) MCG/ACT inhaler Inhale 2 puffs into the lungs every 6 (six) hours as needed for wheezing or shortness of breath. 05/16/20  Yes Deliah Boston F, FNP  amLODipine (NORVASC) 10 MG tablet Take 1 tablet (10 mg total) by mouth daily. 11/14/20  Yes Gwenlyn Fudge, FNP   amoxicillin-clavulanate (AUGMENTIN) 875-125 MG tablet Take 1 tablet by mouth 2 (two) times daily for 7 days. 03/11/21 03/18/21 Yes Gwenlyn Fudge, FNP  citalopram (CELEXA) 40 MG tablet Take 1 tablet (40 mg total) by mouth daily. 11/14/20  Yes Gwenlyn Fudge, FNP  diphenhydrAMINE (BENADRYL) 25 MG tablet Take 25 mg by mouth at bedtime as needed (for allergies.).   Yes [provider]  fluticasone (FLONASE) 50 MCG/ACT nasal spray Use 1 spray(s) in each nostril twice daily Patient taking differently: Place 1 spray into both nostrils daily. 02/12/21  Yes Deliah Boston F, FNP  L-Lysine 500 MG CAPS Take 1 capsule by mouth daily.   Yes [provider]  lisinopril-hydrochlorothiazide (ZESTORETIC) 20-25 MG tablet Take 1 tablet by mouth daily. 11/14/20  Yes Deliah Boston F, FNP  lovastatin (MEVACOR) 40 MG tablet Take 1 tablet (40 mg total) by mouth at bedtime. 11/14/20  Yes Deliah Boston F, FNP  metFORMIN (GLUCOPHAGE) 500 MG tablet Take 2 tablets (1,000 mg total) by mouth 2 (two) times daily. 11/14/20  Yes Deliah Boston F, FNP  methylPREDNISolone (MEDROL DOSEPAK) 4 MG TBPK tablet Use as directed. Patient taking differently: Take 4 mg by mouth as directed. Use as directed. 03/11/21  Yes Deliah Boston F, FNP  Semaglutide (RYBELSUS) 14 MG TABS Take 14 mg by mouth daily. 05/16/20  Yes Deliah Boston F, FNP  traZODone (DESYREL) 50 MG tablet TAKE 1 TABLET BY MOUTH AT BEDTIME AS NEEDED FOR SLEEP Patient taking differently: Take 50 mg by mouth at bedtime as needed for sleep. 01/14/21  Yes Gwenlyn Fudge, FNP  EQ LORATADINE 10 MG tablet Take 1 tablet by mouth once daily Patient not taking: Reported on 03/17/2021 06/27/20   Daryll Drown, NP  fluticasone-salmeterol (ADVAIR HFA) [phone] MCG/ACT inhaler Inhale 2 puffs into the lungs 2 (two) times daily. Patient not taking: Reported on 02/18/2021 06/03/20   Daryll Drown, NP  ipratropium (ATROVENT) 0.03 % nasal spray Place 2 sprays into both  nostrils every 12 (twelve) hours for 3 days. Patient not taking: Reported on 03/17/2021 03/11/21 03/14/21  Gwenlyn Fudge, FNP  triamcinolone (KENALOG) 0.1 % Apply 1 application topically 2 (two) times daily. Patient not taking: Reported on 03/17/2021 05/16/20   Gwenlyn Fudge, FNP    Allergies    Marcelline Deist [dapagliflozin] and Sulfa antibiotics  Review of Systems   Review of Systems  Constitutional:  Negative for fever.  HENT:  Positive for congestion, postnasal drip, rhinorrhea, sinus pressure, sinus pain and sore throat.   Respiratory:  Positive for cough and wheezing. Negative for shortness of breath.   Cardiovascular:  Negative for chest pain.  Gastrointestinal:  Positive for vomiting. Negative for abdominal pain, diarrhea and nausea.  Neurological:  Negative for  headaches.  All other systems reviewed and are negative.  Physical Exam Updated Vital Signs BP 128/67 (BP Location: Left Arm)   Pulse 82   Temp 97.9 F (36.6 C) (Oral)   Resp (!) 25   Ht 5\' 3"  (1.6 m)   Wt 77.1 kg   SpO2 100%   BMI 30.11 kg/m   Physical Exam Constitutional:      General: She is not in acute distress.    Appearance: She is not ill-appearing or toxic-appearing.  HENT:     Head: Normocephalic.     Nose: Nose normal.     Mouth/Throat:     Mouth: Mucous membranes are moist.  Eyes:     Extraocular Movements: Extraocular movements intact.     Pupils: Pupils are equal, round, and reactive to light.  Cardiovascular:     Rate and Rhythm: Normal rate and regular rhythm.  Pulmonary:     Effort: Pulmonary effort is normal.     Breath sounds: Wheezing present.  Abdominal:     General: Abdomen is flat.     Palpations: Abdomen is soft.     Tenderness: There is no abdominal tenderness.  Musculoskeletal:     Cervical back: Normal range of motion.  Skin:    General: Skin is warm and dry.     Capillary Refill: Capillary refill takes less than 2 seconds.  Neurological:     General: No focal deficit  present.     Mental Status: She is alert.  Psychiatric:        Mood and Affect: Mood normal.    ED Results / Procedures / Treatments   Labs (all labs ordered are listed, but only abnormal results are displayed) Labs Reviewed  BASIC METABOLIC PANEL - Abnormal; Notable for the following components:      Result Value   Sodium 126 (*)    Chloride 87 (*)    Glucose, Bld 184 (*)    BUN 28 (*)    Creatinine, Ser 2.65 (*)    GFR, Estimated 19 (*)    All other components within normal limits  CBC WITH DIFFERENTIAL/PLATELET - Abnormal; Notable for the following components:   WBC 19.4 (*)    Platelets 608 (*)    Neutro Abs 15.6 (*)    Monocytes Absolute 1.2 (*)    Abs Immature Granulocytes 0.87 (*)    All other components within normal limits  LACTIC ACID, PLASMA - Abnormal; Notable for the following components:   Lactic Acid, Venous 3.2 (*)    All other components within normal limits  LACTIC ACID, PLASMA - Abnormal; Notable for the following components:   Lactic Acid, Venous 2.4 (*)    All other components within normal limits  URINALYSIS, ROUTINE W REFLEX MICROSCOPIC - Abnormal; Notable for the following components:   Glucose, UA 100 (*)    Hgb urine dipstick TRACE (*)    All other components within normal limits  URINALYSIS, MICROSCOPIC (REFLEX) - Abnormal; Notable for the following components:   Bacteria, UA MANY (*)    All other components within normal limits  CBG MONITORING, ED - Abnormal; Notable for the following components:   Glucose-Capillary 161 (*)    All other components within normal limits  CULTURE, BLOOD (ROUTINE X 2)  CULTURE, BLOOD (ROUTINE X 2)  RESP PANEL BY RT-PCR (FLU A&B, COVID) [id number]  PROTIME-INR  APTT    EKG None  Radiology DG Chest Portable 1 View  Result Date: 03/17/2021 CLINICAL DATA:  70 year old  female with cough and congestion for 2 weeks. Started on antibiotics and steroids. EXAM: PORTABLE CHEST 1 VIEW COMPARISON:  None. FINDINGS:  Portable AP upright view at 0850 hours. Low normal lung volumes. Normal cardiac size and mediastinal contours. Visualized tracheal air column is within normal limits. Streaky opacity in the mid right lung including along the minor fissure. But elsewhere Allowing for portable technique the lungs are clear. No pleural effusion. No pulmonary edema. No acute osseous abnormality identified. Paucity of bowel gas in the upper abdomen. IMPRESSION: Streaky opacity in the mid right lung with no prior imaging. Nonspecific, but suspicious for bronchopneumonia in this setting. No pleural effusion. Electronically Signed   By: Odessa Fleming M.D.   On: 03/17/2021 09:09    Procedures .Critical Care Performed by: Al Decant, PA Authorized by: Al Decant, PA   Critical care provider statement:    Critical care time (minutes):  45   Critical care time was exclusive of:  Separately billable procedures and treating other patients   Critical care was necessary to treat or prevent imminent or life-threatening deterioration of the following conditions:  Renal failure and sepsis   Critical care was time spent personally by me on the following activities:  Blood draw for specimens, ordering and performing treatments and interventions, ordering and review of laboratory studies, ordering and review of radiographic studies, pulse oximetry, re-evaluation of patient's condition, review of old charts, obtaining history from patient or surrogate, interpretation of cardiac output measurements, examination of patient, evaluation of patient's response to treatment, discussions with consultants and development of treatment plan with patient or surrogate   I assumed direction of critical care for this patient from another provider in my specialty: no     Care discussed with: admitting provider     Medications Ordered in ED Medications  albuterol (VENTOLIN HFA) 108 (90 Base) MCG/ACT inhaler (  Canceled Entry 03/17/21 0922)   lactated ringers infusion ( Intravenous New Bag/Given 03/17/21 1001)  cefTRIAXone (ROCEPHIN) 2 g in sodium chloride 0.9 % 100 mL IVPB (2 g Intravenous New Bag/Given 03/17/21 1013)  azithromycin (ZITHROMAX) 500 mg in sodium chloride 0.9 % 250 mL IVPB (500 mg Intravenous New Bag/Given 03/17/21 0958)  albuterol (VENTOLIN HFA) 108 (90 Base) MCG/ACT inhaler 2 puff (2 puffs Inhalation Given 03/17/21 0922)  lactated ringers bolus 1,000 mL (1,000 mLs Intravenous New Bag/Given 03/17/21 0949)    ED Course  I have reviewed the triage vital signs and the nursing notes.  Pertinent labs & imaging results that were available during my care of the patient were reviewed by me and considered in my medical decision making (see chart for details).    MDM Rules/Calculators/A&P                          74-year-old female presents with 2 weeks of ongoing cough and congestion.  On exam, patient is speaking full sentences, nonhypoxic, afebrile.  Patient states that she was seen by her PCP for this 2 weeks ago and started on Augmentin and Medrol Dosepak.  Patient continues that she has been noncompliant on her medications and presented today to the ED for evaluation of her ongoing symptoms.  On exam patient endorses vomiting after medication consumption, wheezing, cough, postnasal drainage, sinus pressure, sore throat.  Patient has medical history significant for diabetes type 2, hypertension, hyperlipidemia, asthma.  Patient also has smoking history of 1 pack/day for 15 years.  On exam, patient has significant  wheezing to her left side upper and middle lung fields.  Patient states that this has happened to her before, and she usually treats herself with her sisters albuterol nebulizer.  Decision was made to treat patient with 2 puffs of albuterol inhaler here in the department with good effect.  Lab work ordered and interpreted by me includes CBC, BMP.  BMP result significant for increased creatinine at 2.65 and  decreased GFR at 19.  These results are significantly changed from labs that were assessed 3 weeks ago.  Patient CBC is significant for an increased white blood cell count of 19.4.  Imaging studies ordered and interpreted include chest x-ray.  On chest x-ray, patient has right middle lobe consolidation possibly indicative of pneumonia.  Due to current findings of decreased kidney function, elevated white blood cell count and consolidation on chest x-ray, sepsis order set has been initiated on this patient.  This patient will need to be brought in for further management and admitted to the hospital.  A consult order has been placed for this patient.  Initial patient lactic acid 3.2. Repeat lactic acid has decreased to 2.4.  Patient started on IV ceftriaxone, azithromycin for suspected CAP.    Update: Hospitalist, Dr. Kerry Hough, has agreed to see patient and admit for further evaluation and management.  At time of handoff to hospitalist, patient is stable.  Final Clinical Impression(s) / ED Diagnoses Final diagnoses:  Acute renal failure, unspecified acute renal failure type (HCC)  Consolidation lung (HCC)  Sepsis with acute renal failure without septic shock, due to unspecified organism, unspecified acute renal failure type [facility])    Rx / DC Orders ED Discharge Orders     None        Al Decant, [place] 03/17/21 1221    Derwood Kaplan, MD 03/18/21 [phone]

## 2021-03-17 NOTE — Sepsis Progress Note (Signed)
Elink is following this code sepsis ?

## 2021-03-17 NOTE — ED Notes (Signed)
Pt still on IV assisted pt to the bathroom and back. Tolerated well

## 2021-03-17 NOTE — ED Notes (Signed)
Pt choosing to leave ama despite both Dr. Kerry Hough and this nurse talking with her about the severity of her illness, risks, and poor outcomes that could potentially be associated with leaving against medical advice. Pt was able to verbalize back all the risks and was educated on s/sx requiring her to return to ED for further workup. Pt voiced understanding.

## 2021-03-17 NOTE — ED Triage Notes (Signed)
Pt c/o of cough/ congestion x2weeks. PCP placed pt on antibiotics and steroids x1week ago.

## 2021-03-17 NOTE — ED Notes (Signed)
Lactic and blood cultures x2 drawn

## 2021-03-18 ENCOUNTER — Encounter: Payer: Self-pay | Admitting: Family Medicine

## 2021-03-18 ENCOUNTER — Observation Stay (HOSPITAL_COMMUNITY)
Admission: EM | Admit: 2021-03-18 | Discharge: 2021-03-19 | Disposition: A | Discharge status: Home / Self Care | Payer: Medicare Other | Attending: Family Medicine | Admitting: Family Medicine

## 2021-03-18 ENCOUNTER — Other Ambulatory Visit: Payer: Self-pay

## 2021-03-18 ENCOUNTER — Encounter (HOSPITAL_COMMUNITY): Payer: Self-pay | Admitting: Emergency Medicine

## 2021-03-18 DIAGNOSIS — R6520 Severe sepsis without septic shock: Secondary | ICD-10-CM | POA: Diagnosis not present

## 2021-03-18 DIAGNOSIS — A419 Sepsis, unspecified organism: Principal | ICD-10-CM | POA: Insufficient documentation

## 2021-03-18 DIAGNOSIS — F419 Anxiety disorder, unspecified: Secondary | ICD-10-CM | POA: Diagnosis present

## 2021-03-18 DIAGNOSIS — I10 Essential (primary) hypertension: Secondary | ICD-10-CM | POA: Diagnosis not present

## 2021-03-18 DIAGNOSIS — E876 Hypokalemia: Secondary | ICD-10-CM | POA: Diagnosis not present

## 2021-03-18 DIAGNOSIS — E119 Type 2 diabetes mellitus without complications: Secondary | ICD-10-CM | POA: Diagnosis not present

## 2021-03-18 DIAGNOSIS — E1159 Type 2 diabetes mellitus with other circulatory complications: Secondary | ICD-10-CM | POA: Diagnosis present

## 2021-03-18 DIAGNOSIS — N179 Acute kidney failure, unspecified: Secondary | ICD-10-CM | POA: Insufficient documentation

## 2021-03-18 DIAGNOSIS — R509 Fever, unspecified: Secondary | ICD-10-CM | POA: Diagnosis present

## 2021-03-18 DIAGNOSIS — J45909 Unspecified asthma, uncomplicated: Secondary | ICD-10-CM | POA: Insufficient documentation

## 2021-03-18 DIAGNOSIS — E1165 Type 2 diabetes mellitus with hyperglycemia: Secondary | ICD-10-CM | POA: Diagnosis present

## 2021-03-18 DIAGNOSIS — J189 Pneumonia, unspecified organism: Secondary | ICD-10-CM | POA: Insufficient documentation

## 2021-03-18 DIAGNOSIS — Z7984 Long term (current) use of oral hypoglycemic drugs: Secondary | ICD-10-CM | POA: Insufficient documentation

## 2021-03-18 DIAGNOSIS — Z79899 Other long term (current) drug therapy: Secondary | ICD-10-CM | POA: Insufficient documentation

## 2021-03-18 DIAGNOSIS — Z87891 Personal history of nicotine dependence: Secondary | ICD-10-CM | POA: Insufficient documentation

## 2021-03-18 DIAGNOSIS — J4520 Mild intermittent asthma, uncomplicated: Secondary | ICD-10-CM

## 2021-03-18 DIAGNOSIS — R059 Cough, unspecified: Secondary | ICD-10-CM | POA: Diagnosis not present

## 2021-03-18 LAB — CBC WITH DIFFERENTIAL/PLATELET
Abs Immature Granulocytes: 0.64 10*3/uL — ABNORMAL HIGH (ref 0.00–0.07)
Basophils Absolute: 0.1 10*3/uL (ref 0.0–0.1)
Basophils Relative: 0 %
Eosinophils Absolute: 0.2 10*3/uL (ref 0.0–0.5)
Eosinophils Relative: 1 %
HCT: 35.1 % — ABNORMAL LOW (ref 36.0–46.0)
Hemoglobin: 11.9 g/dL — ABNORMAL LOW (ref 12.0–15.0)
Immature Granulocytes: 4 %
Lymphocytes Relative: 9 %
Lymphs Abs: 1.5 10*3/uL (ref 0.7–4.0)
MCH: 28.9 pg (ref 26.0–34.0)
MCHC: 33.9 g/dL (ref 30.0–36.0)
MCV: 85.2 fL (ref 80.0–100.0)
Monocytes Absolute: 1.2 10*3/uL — ABNORMAL HIGH (ref 0.1–1.0)
Monocytes Relative: 8 %
Neutro Abs: 12.3 10*3/uL — ABNORMAL HIGH (ref 1.7–7.7)
Neutrophils Relative %: 78 %
Platelets: 611 10*3/uL — ABNORMAL HIGH (ref 150–400)
RBC: 4.12 MIL/uL (ref 3.87–5.11)
RDW: 13.6 % (ref 11.5–15.5)
WBC: 15.9 10*3/uL — ABNORMAL HIGH (ref 4.0–10.5)
nRBC: 0 % (ref 0.0–0.2)

## 2021-03-18 LAB — BASIC METABOLIC PANEL
Anion gap: 12 (ref 5–15)
BUN: 18 mg/dL (ref 8–23)
CO2: 28 mmol/L (ref 22–32)
Calcium: 9.4 mg/dL (ref 8.9–10.3)
Chloride: 89 mmol/L — ABNORMAL LOW (ref 98–111)
Creatinine, Ser: 1.98 mg/dL — ABNORMAL HIGH (ref 0.44–1.00)
GFR, Estimated: 27 mL/min — ABNORMAL LOW (ref >60)
Glucose, Bld: 212 mg/dL — ABNORMAL HIGH (ref 70–99)
Potassium: 3 mmol/L — ABNORMAL LOW (ref 3.5–5.1)
Sodium: 129 mmol/L — ABNORMAL LOW (ref 135–145)

## 2021-03-18 LAB — GLUCOSE, CAPILLARY
Glucose-Capillary: 125 mg/dL — ABNORMAL HIGH (ref 70–99)
Glucose-Capillary: 163 mg/dL — ABNORMAL HIGH (ref 70–99)

## 2021-03-18 LAB — LACTIC ACID, PLASMA
Lactic Acid, Venous: 1.9 mmol/L (ref 0.5–1.9)
Lactic Acid, Venous: 1.7 mmol/L (ref 0.5–1.9)

## 2021-03-18 LAB — CBG MONITORING, ED: Glucose-Capillary: 159 mg/dL — ABNORMAL HIGH (ref 70–99)

## 2021-03-18 LAB — HEMOGLOBIN A1C
Hgb A1c MFr Bld: 7.2 % — ABNORMAL HIGH (ref 4.8–5.6)
Mean Plasma Glucose: 159.94 mg/dL

## 2021-03-18 LAB — HIV ANTIBODY (ROUTINE TESTING W REFLEX): HIV Screen 4th Generation wRfx: NONREACTIVE

## 2021-03-18 MED ORDER — ACETAMINOPHEN 650 MG RE SUPP: 650.0000 mg | Freq: Four times a day (QID) | RECTAL | Status: DC | PRN

## 2021-03-18 MED ORDER — AMLODIPINE BESYLATE 5 MG PO TABS
10.0000 mg | ORAL_TABLET | Freq: Every day | ORAL | Status: DC
  Administered 2021-03-18 – 2021-03-19 (×2): 10 mg via ORAL
  Filled 2021-03-18 (×2): qty 2

## 2021-03-18 MED ORDER — SODIUM CHLORIDE 0.9 % IV SOLN
1.0000 g | Freq: Once | INTRAVENOUS | Status: AC
  Administered 2021-03-18: 1 g via INTRAVENOUS
  Filled 2021-03-18: qty 10

## 2021-03-18 MED ORDER — BISACODYL 10 MG RE SUPP: 10.0000 mg | Freq: Every day | RECTAL | Status: DC | PRN

## 2021-03-18 MED ORDER — INSULIN ASPART 100 UNIT/ML IJ SOLN: 0.0000 [IU] | Freq: Every day | INTRAMUSCULAR | Status: DC

## 2021-03-18 MED ORDER — SODIUM CHLORIDE 0.9% FLUSH
3.0000 mL | Freq: Two times a day (BID) | INTRAVENOUS | Status: DC
  Administered 2021-03-18: 3 mL via INTRAVENOUS

## 2021-03-18 MED ORDER — ACETAMINOPHEN 325 MG PO TABS: 650.0000 mg | ORAL_TABLET | Freq: Four times a day (QID) | ORAL | Status: DC | PRN

## 2021-03-18 MED ORDER — ALBUTEROL SULFATE (2.5 MG/3ML) 0.083% IN NEBU: 2.5000 mg | INHALATION_SOLUTION | RESPIRATORY_TRACT | Status: DC | PRN

## 2021-03-18 MED ORDER — ONDANSETRON HCL 4 MG/2ML IJ SOLN: 4.0000 mg | Freq: Four times a day (QID) | INTRAMUSCULAR | Status: DC | PRN

## 2021-03-18 MED ORDER — SODIUM CHLORIDE 0.9% FLUSH
3.0000 mL | Freq: Two times a day (BID) | INTRAVENOUS | Status: DC
  Administered 2021-03-18 – 2021-03-19 (×2): 3 mL via INTRAVENOUS

## 2021-03-18 MED ORDER — POLYETHYLENE GLYCOL 3350 17 G PO PACK: 17.0000 g | PACK | Freq: Every day | ORAL | Status: DC | PRN

## 2021-03-18 MED ORDER — CITALOPRAM HYDROBROMIDE 20 MG PO TABS
40.0000 mg | ORAL_TABLET | Freq: Every day | ORAL | Status: DC
  Administered 2021-03-19: 40 mg via ORAL
  Filled 2021-03-18: qty 4

## 2021-03-18 MED ORDER — PRAVASTATIN SODIUM 10 MG PO TABS
20.0000 mg | ORAL_TABLET | Freq: Every day | ORAL | Status: DC
  Administered 2021-03-18: 20 mg via ORAL
  Filled 2021-03-18: qty 2

## 2021-03-18 MED ORDER — SODIUM CHLORIDE 0.9 % IV SOLN: INTRAVENOUS | Status: DC

## 2021-03-18 MED ORDER — SODIUM CHLORIDE 0.9% FLUSH: 3.0000 mL | INTRAVENOUS | Status: DC | PRN

## 2021-03-18 MED ORDER — DIPHENHYDRAMINE HCL 25 MG PO CAPS: 25.0000 mg | ORAL_CAPSULE | Freq: Four times a day (QID) | ORAL | Status: DC | PRN

## 2021-03-18 MED ORDER — IPRATROPIUM-ALBUTEROL 0.5-2.5 (3) MG/3ML IN SOLN
3.0000 mL | Freq: Four times a day (QID) | RESPIRATORY_TRACT | Status: DC
  Administered 2021-03-18 (×2): 3 mL via RESPIRATORY_TRACT
  Filled 2021-03-18 (×2): qty 3

## 2021-03-18 MED ORDER — SEMAGLUTIDE 14 MG PO TABS: 14.0000 mg | ORAL_TABLET | Freq: Every day | ORAL | Status: DC

## 2021-03-18 MED ORDER — DOXYCYCLINE HYCLATE 100 MG PO TABS
100.0000 mg | ORAL_TABLET | Freq: Once | ORAL | Status: AC
  Administered 2021-03-18: 100 mg via ORAL
  Filled 2021-03-18: qty 1

## 2021-03-18 MED ORDER — GUAIFENESIN ER 600 MG PO TB12
600.0000 mg | ORAL_TABLET | Freq: Two times a day (BID) | ORAL | Status: DC
  Administered 2021-03-18 – 2021-03-19 (×3): 600 mg via ORAL
  Filled 2021-03-18 (×3): qty 1

## 2021-03-18 MED ORDER — POTASSIUM CHLORIDE 10 MEQ/100ML IV SOLN
10.0000 meq | INTRAVENOUS | Status: AC
  Administered 2021-03-18 (×2): 10 meq via INTRAVENOUS
  Filled 2021-03-18 (×2): qty 100

## 2021-03-18 MED ORDER — HEPARIN SODIUM (PORCINE) 5000 UNIT/ML IJ SOLN
5000.0000 [IU] | Freq: Three times a day (TID) | INTRAMUSCULAR | Status: DC
  Administered 2021-03-18 – 2021-03-19 (×3): 5000 [IU] via SUBCUTANEOUS
  Filled 2021-03-18 (×3): qty 1

## 2021-03-18 MED ORDER — ONDANSETRON HCL 4 MG PO TABS: 4.0000 mg | ORAL_TABLET | Freq: Four times a day (QID) | ORAL | Status: DC | PRN

## 2021-03-18 MED ORDER — SODIUM CHLORIDE 0.9 % IV SOLN
500.0000 mg | INTRAVENOUS | Status: DC
  Administered 2021-03-18 – 2021-03-19 (×2): 500 mg via INTRAVENOUS
  Filled 2021-03-18 (×2): qty 500

## 2021-03-18 MED ORDER — SODIUM CHLORIDE 0.9 % IV BOLUS
1000.0000 mL | Freq: Once | INTRAVENOUS | Status: AC
  Administered 2021-03-18: 1000 mL via INTRAVENOUS

## 2021-03-18 MED ORDER — TRAZODONE HCL 50 MG PO TABS
50.0000 mg | ORAL_TABLET | Freq: Every evening | ORAL | Status: DC | PRN
  Administered 2021-03-18: 50 mg via ORAL
  Filled 2021-03-18: qty 1

## 2021-03-18 MED ORDER — POTASSIUM CHLORIDE CRYS ER 20 MEQ PO TBCR
40.0000 meq | EXTENDED_RELEASE_TABLET | Freq: Once | ORAL | Status: AC
  Administered 2021-03-18: 40 meq via ORAL
  Filled 2021-03-18: qty 2

## 2021-03-18 MED ORDER — INSULIN ASPART 100 UNIT/ML IJ SOLN
0.0000 [IU] | Freq: Three times a day (TID) | INTRAMUSCULAR | Status: DC
  Administered 2021-03-18: 1 [IU] via SUBCUTANEOUS
  Administered 2021-03-18: 2 [IU] via SUBCUTANEOUS
  Administered 2021-03-19 (×2): 1 [IU] via SUBCUTANEOUS
  Filled 2021-03-18: qty 1

## 2021-03-18 MED ORDER — SODIUM CHLORIDE 0.9 % IV SOLN
1.0000 g | INTRAVENOUS | Status: DC
  Administered 2021-03-19: 1 g via INTRAVENOUS
  Filled 2021-03-18: qty 10

## 2021-03-18 MED ORDER — IPRATROPIUM-ALBUTEROL 0.5-2.5 (3) MG/3ML IN SOLN
3.0000 mL | Freq: Three times a day (TID) | RESPIRATORY_TRACT | Status: DC
  Administered 2021-03-19: 3 mL via RESPIRATORY_TRACT
  Filled 2021-03-18: qty 3

## 2021-03-18 MED ORDER — SODIUM CHLORIDE 0.9 % IV SOLN: 250.0000 mL | INTRAVENOUS | Status: DC | PRN

## 2021-03-18 NOTE — H&P (Signed)
Patient Demographics:    Jeanne Medina, is a 70 y.o. female  MRN: [id number]   DOB - 07/27/1950  Admit Date - 03/18/2021  Outpatient Primary MD for the patient is Gwenlyn Fudge, FNP   Assessment & Plan:    Principal Problem:   Sepsis due to CAP Active Problems:   AKI (acute kidney injury) (HCC)   Pneumonia   Essential hypertension   Anxiety   Type 2 diabetes mellitus with hyperglycemia, without long-term current use of insulin (HCC)   1)Sepsis secondary to right-sided community-acquired pneumonia--POA -Patient failed outpatient oral antibiotics Lactic acid is down to 1.7 from 3.2 on 03/17/2021 -WBC is down to 15.9 from 19.4 on 03/17/2021 -Continue IV fluids and IV Rocephin and azithromycin -Potassium mucolytics as ordered -No significant hypoxia at this time -Tachycardia and tachypnea noted  2)AKI-----suspect due to dehydration/volume depletion in the setting of sepsis -Compounded by lisinopril HCTZ use - creatinine is down to 1.98 from 2.65 -Baseline creatinine usually around 1  renally adjust medications, avoid nephrotoxic agents / dehydration  / hypotension -Continue IV fluids -Hold lisinopril HCTZ -Hold metformin  3)DM2-A1c 7.2 reflecting uncontrolled DM with hyperglycemia PTA -Lantus insulin 14 units daily along with sliding scale coverage Use Novolog/Humalog Sliding scale insulin with Accu-Cheks/Fingersticks as ordered   4)HypoNatremia/hypokalemia-- -Sodium is up to 129 from 126  -Continue IV fluids, - discontinue HCTZ -Replace potassium -Celexa and trazodone may be contributory to hyponatremia   5)Depression/anxiety--- continue Celexa, may also use trazodone as needed for sleep  6)HLD--continue pravastatin  7)HTN--continue amlodipine 10 mg   Disposition/Need for in-Hospital  Stay- patient unable to be discharged at this time due to -sepsis secondary to pneumonia resulting in AKI and lactic acidosis and requiring IV fluids and IV antibiotics -Possible discharge home in 1 to 2 days if improves   Dispo: The patient is from: Home              Anticipated d/c is to: Home              Anticipated d/c date is: 1 day              Patient currently is not medically stable to d/c. Barriers: Not Clinically Stable-    With History of - Reviewed by me  Past Medical History:  Diagnosis Date   Anxiety    Arthritis    osteoarthritis   Asthma    Diabetes mellitus without complication (HCC)    Hypercholesteremia    Hypertension       Past Surgical History:  Procedure Laterality Date   CATARACT EXTRACTION W/PHACO Left 08/18/2016   Procedure: CATARACT EXTRACTION PHACO AND INTRAOCULAR LENS PLACEMENT (IOC);  Surgeon: Jethro Bolus, MD;  Location: [facility];  Service: Ophthalmology;  Laterality: Left;  CDE: 6.05   CATARACT EXTRACTION W/PHACO Left 09/01/2016   Procedure: REPOSITIONING OF LEFT IOL LENS;  Surgeon: Jethro Bolus, MD;  Location: [facility];  Service: Ophthalmology;  Laterality:  Left;   CATARACT EXTRACTION W/PHACO Right 10/06/2016   Procedure: CATARACT EXTRACTION PHACO AND INTRAOCULAR LENS PLACEMENT (IOC);  Surgeon: Jethro Bolus, MD;  Location: [facility];  Service: Ophthalmology;  Laterality: Right;  CDE: 6.21    CC-- Cough and congestion x 2 weeks      HPI:    Jeanne Medina  is a 70 y.o. female with past medical history relevant for asthma, HLD, DM2, HTN who to the ED with concerns for cough congestion and shortness of breath for the last couple weeks -He was treated as outpatient by PCP with antibiotics including amoxicillin but symptoms persisted -In the ED on 03/17/2021 found to have sepsis secondary to community-acquired pneumonia as well as AKI she was advised to stay overnight in the hospital for admission but she refused she was treated in the ED and she signed  out AMA on 03/17/2021 -She returns today due to fatigue, persistent cough and shortness of breath - ED she is found to be tachycardic and tachypneic initially blood pressures were soft -Unlike yesterday she does not have appear to have a fever today -Chest x-ray from 03/17/2021 was suggestive of right-sided pneumonia  -Lactic acid is down to 1.7 from 3.2 on 03/17/2021 -WBC is down to 15.9 from 19.4 on 03/17/2021 -Sodium is up to 129 from 126 creatinine is down to 1.98 from 2.65 -Antibiotics and IV fluids were started in the ED   Review of systems:    In addition to the HPI above,   A full Review of  Systems was done, all other systems reviewed are negative except as noted above in HPI , .    Social History:  Reviewed by me    Social History   Tobacco Use   Smoking status: Former    Packs/day: 0.25    Years: 20.00    Pack years: 5.00    Types: Cigarettes    Quit date: 08/13/1980    Years since quitting: 40.6   Smokeless tobacco: Never  Substance Use Topics   Alcohol use: No       Family History :  Reviewed by me    Family History  Problem Relation Age of Onset   Heart failure Mother    Hypertension Mother    Heart disease Mother    Stroke Father    Heart disease Father    Hypertension Father    Dementia Sister    Gallbladder disease Brother    Pancreatic cancer Brother    Heart attack Brother    Rheum arthritis Sister    Heart disease Sister    Cancer Sister        tonsils   Diabetes Sister    Diabetes Sister    Heart disease Sister    Rheum arthritis Sister     Home Medications:   Prior to Admission medications   Medication Sig Start Date End Date Taking? Authorizing Provider  albuterol (VENTOLIN HFA) 108 (90 Base) MCG/ACT inhaler Inhale 2 puffs into the lungs every 6 (six) hours as needed for wheezing or shortness of breath. 03/17/21  Yes Erick Blinks, MD  amLODipine (NORVASC) 10 MG tablet Take 1 tablet (10 mg total) by mouth daily. 11/14/20   Yes Gwenlyn Fudge, FNP  amoxicillin-clavulanate (AUGMENTIN) 875-125 MG tablet Take 1 tablet by mouth 2 (two) times daily for 5 days. 03/17/21 03/22/21 Yes Erick Blinks, MD  citalopram (CELEXA) 40 MG tablet Take 1 tablet (40 mg total) by mouth daily. 11/14/20  Yes Gwenlyn Fudge,  FNP  diphenhydrAMINE (BENADRYL) 25 MG tablet Take 25 mg by mouth at bedtime as needed (for allergies.).   Yes [provider]  L-Lysine 500 MG CAPS Take 1 capsule by mouth daily.   Yes [provider]  lisinopril-hydrochlorothiazide (ZESTORETIC) 20-25 MG tablet Take 1 tablet by mouth daily.   Yes [provider]  lovastatin (MEVACOR) 40 MG tablet Take 1 tablet (40 mg total) by mouth at bedtime. 11/14/20  Yes Deliah Boston F, FNP  metFORMIN (GLUCOPHAGE) 500 MG tablet Take 1,000 mg by mouth 2 (two) times daily with a meal.   Yes [provider]  Semaglutide (RYBELSUS) 14 MG TABS Take 14 mg by mouth daily. 05/16/20  Yes Deliah Boston F, FNP  traZODone (DESYREL) 50 MG tablet TAKE 1 TABLET BY MOUTH AT BEDTIME AS NEEDED FOR SLEEP Patient taking differently: Take 50 mg by mouth at bedtime as needed for sleep. 01/14/21  Yes Gwenlyn Fudge, FNP  EQ LORATADINE 10 MG tablet Take 1 tablet by mouth once daily Patient not taking: Reported on 03/17/2021 06/27/20   Daryll Drown, NP  fluticasone [facility]) 50 MCG/ACT nasal spray Use 1 spray(s) in each nostril twice daily Patient not taking: Reported on 03/18/2021 02/12/21   Gwenlyn Fudge, FNP  guaiFENesin (MUCINEX) 600 MG 12 hr tablet Take 1 tablet (600 mg total) by mouth 2 (two) times daily. Patient not taking: Reported on 03/18/2021 03/17/21 03/17/22  Erick Blinks, MD     Allergies:     Allergies  Allergen Reactions   Farxiga [Dapagliflozin]     Yeast infections   Sulfa Antibiotics Hives and Itching    welts     Physical Exam:   Vitals  Blood pressure (!) 128/59, pulse 89, temperature (!) 97.5 F (36.4 C), resp. rate 20,  height 5\' 3"  (1.6 m), weight 77.1 kg, SpO2 98 %.  Physical Examination: General appearance - alert,  and in no distress  Mental status - alert, oriented to person, place, and time,  Eyes - sclera anicteric Neck - supple, no JVD elevation , Chest -diminished breath sounds with scattered wheezing  heart - S1 and S2 normal, regular  Abdomen - soft, nontender, nondistended, no masses or organomegaly Neurological - screening mental status exam normal, neck supple without rigidity, cranial nerves II through XII intact, DTR's normal and symmetric Extremities - no pedal edema noted, intact peripheral pulses  Skin - warm, dry     Data Review:    CBC Recent Labs  Lab 03/17/21 0846 03/18/21 1041  WBC 19.4* 15.9*  HGB 12.5 11.9*  HCT 36.0 35.1*  PLT 608* 611*  MCV 81.8 85.2  MCH 28.4 28.9  MCHC 34.7 33.9  RDW 13.1 13.6  LYMPHSABS 1.6 1.5  MONOABS 1.2* 1.2*  EOSABS 0.1 0.2  BASOSABS 0.1 0.1   ------------------------------------------------------------------------------------------------------------------  Chemistries  Recent Labs  Lab 03/17/21 0846 03/18/21 1041  NA 126* 129*  K 3.8 3.0*  CL 87* 89*  CO2 27 28  GLUCOSE 184* 212*  BUN 28* 18  CREATININE 2.65* 1.98*  CALCIUM 9.7 9.4   ------------------------------------------------------------------------------------------------------------------ estimated creatinine clearance is 26 mL/min (A) (by C-G formula based on SCr of 1.98 mg/dL (H)). ------------------------------------------------------------------------------------------------------------------ No results for input(s): TSH, T4TOTAL, T3FREE, THYROIDAB in the last 72 hours.  Invalid input(s): FREET3   Coagulation profile Recent Labs  Lab 03/17/21 0846  INR 0.9   ------------------------------------------------------------------------------------------------------------------- No results for input(s): DDIMER in the last 72  hours. -------------------------------------------------------------------------------------------------------------------  Cardiac Enzymes No results for input(s):  CKMB, TROPONINI, MYOGLOBIN in the last 168 hours.  Invalid input(s): CK ------------------------------------------------------------------------------------------------------------------ No results found for: BNP  Urinalysis    Component Value Date/Time   COLORURINE YELLOW 03/17/2021 1109   APPEARANCEUR CLEAR 03/17/2021 1109   LABSPEC 1.010 03/17/2021 1109   PHURINE 7.5 03/17/2021 1109   GLUCOSEU 100 (A) 03/17/2021 1109   HGBUR TRACE (A) 03/17/2021 1109   BILIRUBINUR NEGATIVE 03/17/2021 1109   KETONESUR NEGATIVE 03/17/2021 1109   PROTEINUR NEGATIVE 03/17/2021 1109   NITRITE NEGATIVE 03/17/2021 1109   LEUKOCYTESUR NEGATIVE 03/17/2021 1109     Imaging Results:    DG Chest Portable 1 View  Result Date: 03/17/2021 CLINICAL DATA:  70 year old female with cough and congestion for 2 weeks. Started on antibiotics and steroids. EXAM: PORTABLE CHEST 1 VIEW COMPARISON:  None. FINDINGS: Portable AP upright view at 0850 hours. Low normal lung volumes. Normal cardiac size and mediastinal contours. Visualized tracheal air column is within normal limits. Streaky opacity in the mid right lung including along the minor fissure. But elsewhere Allowing for portable technique the lungs are clear. No pleural effusion. No pulmonary edema. No acute osseous abnormality identified. Paucity of bowel gas in the upper abdomen. IMPRESSION: Streaky opacity in the mid right lung with no prior imaging. Nonspecific, but suspicious for bronchopneumonia in this setting. No pleural effusion. Electronically Signed   By: Odessa Fleming M.D.   On: 03/17/2021 09:09    Radiological Exams on Admission: DG Chest Portable 1 View  Result Date: 03/17/2021 CLINICAL DATA:  70 year old female with cough and congestion for 2 weeks. Started on antibiotics and steroids. EXAM:  PORTABLE CHEST 1 VIEW COMPARISON:  None. FINDINGS: Portable AP upright view at 0850 hours. Low normal lung volumes. Normal cardiac size and mediastinal contours. Visualized tracheal air column is within normal limits. Streaky opacity in the mid right lung including along the minor fissure. But elsewhere Allowing for portable technique the lungs are clear. No pleural effusion. No pulmonary edema. No acute osseous abnormality identified. Paucity of bowel gas in the upper abdomen. IMPRESSION: Streaky opacity in the mid right lung with no prior imaging. Nonspecific, but suspicious for bronchopneumonia in this setting. No pleural effusion. Electronically Signed   By: Odessa Fleming M.D.   On: 03/17/2021 09:09    DVT Prophylaxis -SCD /Heparin AM Labs Ordered, also please review Full Orders  Family Communication: Admission, patients condition and plan of care including tests being ordered have been discussed with the patient  who indicate understanding and agree with the plan   Code Status - Full Code  Likely DC to  home  Condition   -stable  Shon Hale M.D on 03/18/2021 at 6:36 PM Go to [email or web address] -  for contact info  [facility] - Office  [phone]

## 2021-03-18 NOTE — Telephone Encounter (Signed)
Spoke with patient and her verbalizes understanding.  Knows the risks if she does not return and states she will go back to [facility] now.

## 2021-03-18 NOTE — Telephone Encounter (Signed)
Please call patient ASAP and let her know she needs to return to the ER. I have reviewed her visit and her "problem with her kidneys" is due to the fact that she has severe sepsis. She needs to be admitted for treatment; we will not be able to take care of this as an outpatient. Untreated sepsis can lead to death.

## 2021-03-18 NOTE — ED Provider Notes (Signed)
[facility] EMERGENCY DEPARTMENT Provider Note   CSN: [id number] Arrival date & time: 03/18/21  [id number]     History CC: Infection   Jeanne Medina is a 70 y.o. female with history of hypertension, diabetes, presenting to the emergency department after leaving the ER AMA.  The patient to be seen in the emergency department yesterday for fevers, general fatigue, poor appetite.  She underwent a sepsis work-up showing elevated lactate, prerenal AKI, and suspected pneumonia on her chest x-ray.  She had been advised for medical admission and received IV antibiotics in the ER.  However she opted to leave [place].  Subsequently she tried to follow-up with her family practitioner, and per medical record review, was advised to come back immediately to the hospital for sepsis management.  She says she feels much better than she did yesterday.  She denies ongoing fevers.  She reports she has not had any further vomiting or diarrhea.  She denies shortness of breath or chest pain.  She is here with her husband at bedside.  From a medical record review, the patient's blood cultures from yesterday are currently no growth to date at 24 hours.  UA showed no sign of infection. Covid/flu swab were negative.  Xray with right lobe infiltrate.  Lactate was 3.2 -> 2.4.  BMP with Na 126, Cl 87, Cr 2.65, BUN 28 (baseline Cr around 1.0).  Patient did report that she felt dehydrated prior to coming in and had not been drinking enough water  HPI     Past Medical History:  Diagnosis Date   Anxiety    Arthritis    osteoarthritis   Asthma    Diabetes mellitus without complication ([place])    Hypercholesteremia    Hypertension     Patient Active Problem List   Diagnosis Date Noted   AKI (acute kidney injury) ([place]) 03/17/2021   Type 2 diabetes mellitus with hyperglycemia, without long-term current use of insulin ([place]) 05/16/2020   Difficulty sleeping 05/16/2020   History of COVID-19 05/16/2020    Atrophic vaginitis 11/04/2018   Mild intermittent asthma without complication [id number]   Essential hypertension    Hypercholesteremia    Anxiety    Arthritis     Past Surgical History:  Procedure Laterality Date   CATARACT EXTRACTION W/PHACO Left 08/18/2016   Procedure: CATARACT EXTRACTION PHACO AND INTRAOCULAR LENS PLACEMENT ([place]);  Surgeon: Rutherford Guys, MD;  Location: [facility];  Service: Ophthalmology;  Laterality: Left;  CDE: 6.05   CATARACT EXTRACTION W/PHACO Left 09/01/2016   Procedure: REPOSITIONING OF LEFT IOL LENS;  Surgeon: Rutherford Guys, MD;  Location: [facility];  Service: Ophthalmology;  Laterality: Left;   CATARACT EXTRACTION W/PHACO Right 10/06/2016   Procedure: CATARACT EXTRACTION PHACO AND INTRAOCULAR LENS PLACEMENT (I[facility]);  Surgeon: Rutherford Guys, MD;  Location: [facility];  Service: Ophthalmology;  Laterality: Right;  CDE: 6.21     OB History   No obstetric history on file.     Family History  Problem Relation Age of Onset   Heart failure Mother    Hypertension Mother    Heart disease Mother    Stroke Father    Heart disease Father    Hypertension Father    Dementia Sister    Gallbladder disease Brother    Pancreatic cancer Brother    Heart attack Brother    Rheum arthritis Sister    Heart disease Sister    Cancer Sister        tonsils   Diabetes  Sister    Diabetes Sister    Heart disease Sister    Rheum arthritis Sister     Social History   Tobacco Use   Smoking status: Former    Packs/day: 0.25    Years: 20.00    Pack years: 5.00    Types: Cigarettes    Quit date: 08/13/1980    Years since quitting: 40.6   Smokeless tobacco: Never  Substance Use Topics   Alcohol use: No   Drug use: No    Home Medications Prior to Admission medications   Medication Sig Start Date End Date Taking? Authorizing Provider  albuterol (VENTOLIN HFA) 108 (90 Base) MCG/ACT inhaler Inhale 2 puffs into the lungs every 6 (six) hours as needed for wheezing or shortness of  breath. 03/17/21  Yes Erick BlinksMemon, Jehanzeb, MD  amLODipine (NORVASC) 10 MG tablet Take 1 tablet (10 mg total) by mouth daily. 11/14/20  Yes Gwenlyn FudgeJoyce, Britney F, FNP  amoxicillin-clavulanate (AUGMENTIN) 875-125 MG tablet Take 1 tablet by mouth 2 (two) times daily for 5 days. 03/17/21 03/22/21 Yes Erick BlinksMemon, Jehanzeb, MD  citalopram (CELEXA) 40 MG tablet Take 1 tablet (40 mg total) by mouth daily. 11/14/20  Yes Gwenlyn FudgeJoyce, Britney F, FNP  diphenhydrAMINE (BENADRYL) 25 MG tablet Take 25 mg by mouth at bedtime as needed (for allergies.).   Yes [provider]  L-Lysine 500 MG CAPS Take 1 capsule by mouth daily.   Yes [provider]  lisinopril-hydrochlorothiazide (ZESTORETIC) 20-25 MG tablet Take 1 tablet by mouth daily.   Yes [provider]  lovastatin (MEVACOR) 40 MG tablet Take 1 tablet (40 mg total) by mouth at bedtime. 11/14/20  Yes Deliah BostonJoyce, Britney F, FNP  metFORMIN (GLUCOPHAGE) 500 MG tablet Take 1,000 mg by mouth 2 (two) times daily with a meal.   Yes [provider]  Semaglutide (RYBELSUS) 14 MG TABS Take 14 mg by mouth daily. 05/16/20  Yes Deliah BostonJoyce, Britney F, FNP  traZODone (DESYREL) 50 MG tablet TAKE 1 TABLET BY MOUTH AT BEDTIME AS NEEDED FOR SLEEP Patient taking differently: Take 50 mg by mouth at bedtime as needed for sleep. 01/14/21  Yes Gwenlyn FudgeJoyce, Britney F, FNP  EQ LORATADINE 10 MG tablet Take 1 tablet by mouth once daily Patient not taking: Reported on 03/17/2021 06/27/20   Daryll DrownIjaola, Onyeje M, NP  fluticasone [facility](FLONASE) 50 MCG/ACT nasal spray Use 1 spray(s) in each nostril twice daily Patient not taking: Reported on 03/18/2021 02/12/21   Gwenlyn FudgeJoyce, Britney F, FNP  guaiFENesin (MUCINEX) 600 MG 12 hr tablet Take 1 tablet (600 mg total) by mouth 2 (two) times daily. Patient not taking: Reported on 03/18/2021 03/17/21 03/17/22  Erick BlinksMemon, Jehanzeb, MD    Allergies    Marcelline DeistFarxiga [dapagliflozin] and Sulfa antibiotics  Review of Systems   Review of Systems  Constitutional:  Negative for chills  and fever.  HENT:  Negative for ear pain and sore throat.   Eyes:  Negative for pain and visual disturbance.  Respiratory:  Negative for cough and shortness of breath.   Cardiovascular:  Negative for chest pain and palpitations.  Gastrointestinal:  Negative for abdominal pain and vomiting.  Genitourinary:  Negative for dysuria and hematuria.  Musculoskeletal:  Negative for arthralgias and back pain.  Skin:  Negative for color change and rash.  Neurological:  Negative for seizures and syncope.  All other systems reviewed and are negative.  Physical Exam Updated Vital Signs BP (!) 142/86   Pulse 90   Temp 98.7 F (37.1 C) (Oral)  Resp 18   Ht 5\' 3"  (1.6 m)   Wt 77.1 kg   SpO2 100%   BMI 30.11 kg/m   Physical Exam Constitutional:      General: She is not in acute distress. HENT:     Head: Normocephalic and atraumatic.  Eyes:     Conjunctiva/sclera: Conjunctivae normal.     Pupils: Pupils are equal, round, and reactive to light.  Cardiovascular:     Rate and Rhythm: Normal rate and regular rhythm.  Pulmonary:     Effort: Pulmonary effort is normal. No respiratory distress.  Abdominal:     General: There is no distension.     Tenderness: There is no abdominal tenderness.  Skin:    General: Skin is warm and dry.  Neurological:     General: No focal deficit present.     Mental Status: She is alert. Mental status is at baseline.  Psychiatric:        Mood and Affect: Mood normal.        Behavior: Behavior normal.    ED Results / Procedures / Treatments   Labs (all labs ordered are listed, but only abnormal results are displayed) Labs Reviewed  BASIC METABOLIC PANEL - Abnormal; Notable for the following components:      Result Value   Sodium 129 (*)    Potassium 3.0 (*)    Chloride 89 (*)    Glucose, Bld 212 (*)    Creatinine, Ser 1.98 (*)    GFR, Estimated 27 (*)    All other components within normal limits  CBC WITH DIFFERENTIAL/PLATELET - Abnormal; Notable for  the following components:   WBC 15.9 (*)    Hemoglobin 11.9 (*)    HCT 35.1 (*)    Platelets 611 (*)    Neutro Abs 12.3 (*)    Monocytes Absolute 1.2 (*)    Abs Immature Granulocytes 0.64 (*)    All other components within normal limits  CBG MONITORING, ED - Abnormal; Notable for the following components:   Glucose-Capillary 159 (*)    All other components within normal limits  LACTIC ACID, PLASMA  LACTIC ACID, PLASMA  HIV ANTIBODY (ROUTINE TESTING W REFLEX)  HEMOGLOBIN A1C    EKG None  Radiology DG Chest Portable 1 View  Result Date: 03/17/2021 CLINICAL DATA:  70 year old female with cough and congestion for 2 weeks. Started on antibiotics and steroids. EXAM: PORTABLE CHEST 1 VIEW COMPARISON:  None. FINDINGS: Portable AP upright view at 0850 hours. Low normal lung volumes. Normal cardiac size and mediastinal contours. Visualized tracheal air column is within normal limits. Streaky opacity in the mid right lung including along the minor fissure. But elsewhere Allowing for portable technique the lungs are clear. No pleural effusion. No pulmonary edema. No acute osseous abnormality identified. Paucity of bowel gas in the upper abdomen. IMPRESSION: Streaky opacity in the mid right lung with no prior imaging. Nonspecific, but suspicious for bronchopneumonia in this setting. No pleural effusion. Electronically Signed   By: 65 M.D.   On: 03/17/2021 09:09    Procedures Procedures   Medications Ordered in ED Medications  0.9 %  sodium chloride infusion (0 mLs Intravenous Hold 03/18/21 1309)  amLODipine (NORVASC) tablet 10 mg (10 mg Oral Given 03/18/21 1300)  pravastatin (PRAVACHOL) tablet 20 mg (has no administration in time range)  citalopram (CELEXA) tablet 40 mg (has no administration in time range)  traZODone (DESYREL) tablet 50 mg (has no administration in time range)  Semaglutide TABS  14 mg ( Oral Canceled Entry 03/18/21 1138)  sodium chloride flush (NS) 0.9 % injection 3 mL  (3 mLs Intravenous Given 03/18/21 1301)  sodium chloride flush (NS) 0.9 % injection 3 mL (3 mLs Intravenous Not Given 03/18/21 1301)  sodium chloride flush (NS) 0.9 % injection 3 mL (has no administration in time range)  0.9 %  sodium chloride infusion (has no administration in time range)  acetaminophen (TYLENOL) tablet 650 mg (has no administration in time range)    Or  acetaminophen (TYLENOL) suppository 650 mg (has no administration in time range)  polyethylene glycol (MIRALAX / GLYCOLAX) packet 17 g (has no administration in time range)  bisacodyl (DULCOLAX) suppository 10 mg (has no administration in time range)  ondansetron (ZOFRAN) tablet 4 mg (has no administration in time range)    Or  ondansetron (ZOFRAN) injection 4 mg (has no administration in time range)  heparin injection 5,000 Units (has no administration in time range)  guaiFENesin (MUCINEX) 12 hr tablet 600 mg (600 mg Oral Given 03/18/21 1300)  cefTRIAXone (ROCEPHIN) 1 g in sodium chloride 0.9 % 100 mL IVPB (has no administration in time range)  azithromycin (ZITHROMAX) 500 mg in sodium chloride 0.9 % 250 mL IVPB (500 mg Intravenous New Bag/Given 03/18/21 1306)  ipratropium-albuterol (DUONEB) 0.5-2.5 (3) MG/3ML nebulizer solution 3 mL (3 mLs Nebulization Given 03/18/21 1424)  albuterol (PROVENTIL) (2.5 MG/3ML) 0.083% nebulizer solution 2.5 mg (has no administration in time range)  potassium chloride SA (KLOR-CON) CR tablet 40 mEq (has no administration in time range)  insulin aspart (novoLOG) injection 0-9 Units (2 Units Subcutaneous Given 03/18/21 1258)  insulin aspart (novoLOG) injection 0-5 Units (has no administration in time range)  sodium chloride 0.9 % bolus 1,000 mL (1,000 mLs Intravenous New Bag/Given 03/18/21 1040)  cefTRIAXone (ROCEPHIN) 1 g in sodium chloride 0.9 % 100 mL IVPB (0 g Intravenous Stopped 03/18/21 1128)  doxycycline (VIBRA-TABS) tablet 100 mg (100 mg Oral Given 03/18/21 1039)  potassium chloride 10  mEq in 100 mL IVPB (10 mEq Intravenous New Bag/Given 03/18/21 1258)  potassium chloride SA (KLOR-CON) CR tablet 40 mEq (40 mEq Oral Given 03/18/21 1136)  sodium chloride 0.9 % bolus 1,000 mL (1,000 mLs Intravenous New Bag/Given 03/18/21 1258)    ED Course  I have reviewed the triage vital signs and the nursing notes.  Pertinent labs & imaging results that were available during my care of the patient were reviewed by me and considered in my medical decision making (see chart for details).  Patient is returning to Emergency Department after leaving yesterday for possible medical admission for sepsis evaluation.  We will recheck her lactate level, kidney function, white blood cell count.  I have ordered some Rocephin and doxycycline to continue with treatment for possible community pneumonia.  I personally reviewed her prior medical records from yesterday, including UA and COVID and flu swab, which are unremarkable.  I have also ordered a liter of fluids here for hydration as well.  She is clinically well-appearing otherwise.    I personally reviewed and interpreted her labs today, notable for K 3.0, WBC 15.9 (down from yesterday), lactate 1.7, Na 129, Cr 1.98  I also reviewed her medical records as noted above, including her ED visit yesterday  IV rocephin ordered, doxy for CAP IV K and PO K ordered for repletion    Clinical Course as of 03/18/21 1439  Tue Mar 18, 2021  1217 Admitted to hospitalist [MT]    Clinical Course User Index [  MT] Wyvonnia Dusky, MD   Final Clinical Impression(s) / ED Diagnoses Final diagnoses:  Community acquired pneumonia, unspecified laterality  Hypokalemia  AKI (acute kidney injury) [facility])    Rx / [place] Orders ED Discharge Orders     None        Felesha Moncrieffe, Carola Rhine, MD 03/18/21 1439

## 2021-03-18 NOTE — ED Triage Notes (Signed)
Pt here from  home was here yesterday and was to be admitted but wanted to leave , pt Md called her back today to come back due to elevated kidney function and sepsis

## 2021-03-18 NOTE — ED Notes (Signed)
Pt ambulated to and from bathroom without any restrictions.

## 2021-03-18 NOTE — ED Notes (Signed)
Meal given

## 2021-03-19 DIAGNOSIS — A419 Sepsis, unspecified organism: Secondary | ICD-10-CM | POA: Diagnosis not present

## 2021-03-19 DIAGNOSIS — E1165 Type 2 diabetes mellitus with hyperglycemia: Secondary | ICD-10-CM | POA: Diagnosis not present

## 2021-03-19 DIAGNOSIS — I10 Essential (primary) hypertension: Secondary | ICD-10-CM | POA: Diagnosis not present

## 2021-03-19 DIAGNOSIS — J189 Pneumonia, unspecified organism: Secondary | ICD-10-CM | POA: Diagnosis not present

## 2021-03-19 LAB — RENAL FUNCTION PANEL
Albumin: 2.7 g/dL — ABNORMAL LOW (ref 3.5–5.0)
Anion gap: 7 (ref 5–15)
BUN: 15 mg/dL (ref 8–23)
CO2: 27 mmol/L (ref 22–32)
Calcium: 8.4 mg/dL — ABNORMAL LOW (ref 8.9–10.3)
Chloride: 102 mmol/L (ref 98–111)
Creatinine, Ser: 1.7 mg/dL — ABNORMAL HIGH (ref 0.44–1.00)
GFR, Estimated: 32 mL/min — ABNORMAL LOW (ref >60)
Glucose, Bld: 131 mg/dL — ABNORMAL HIGH (ref 70–99)
Phosphorus: 2.2 mg/dL — ABNORMAL LOW (ref 2.5–4.6)
Potassium: 4 mmol/L (ref 3.5–5.1)
Sodium: 136 mmol/L (ref 135–145)

## 2021-03-19 LAB — GLUCOSE, CAPILLARY
Glucose-Capillary: 146 mg/dL — ABNORMAL HIGH (ref 70–99)
Glucose-Capillary: 146 mg/dL — ABNORMAL HIGH (ref 70–99)

## 2021-03-19 LAB — CBC
HCT: 34.1 % — ABNORMAL LOW (ref 36.0–46.0)
Hemoglobin: 11.2 g/dL — ABNORMAL LOW (ref 12.0–15.0)
MCH: 28.6 pg (ref 26.0–34.0)
MCHC: 32.8 g/dL (ref 30.0–36.0)
MCV: 87.2 fL (ref 80.0–100.0)
Platelets: 564 10*3/uL — ABNORMAL HIGH (ref 150–400)
RBC: 3.91 MIL/uL (ref 3.87–5.11)
RDW: 14.1 % (ref 11.5–15.5)
WBC: 14 10*3/uL — ABNORMAL HIGH (ref 4.0–10.5)
nRBC: 0 % (ref 0.0–0.2)

## 2021-03-19 LAB — BASIC METABOLIC PANEL
Anion gap: 10 (ref 5–15)
BUN: 13 mg/dL (ref 8–23)
CO2: 26 mmol/L (ref 22–32)
Calcium: 8.5 mg/dL — ABNORMAL LOW (ref 8.9–10.3)
Chloride: 98 mmol/L (ref 98–111)
Creatinine, Ser: 1.71 mg/dL — ABNORMAL HIGH (ref 0.44–1.00)
GFR, Estimated: 32 mL/min — ABNORMAL LOW (ref >60)
Glucose, Bld: 177 mg/dL — ABNORMAL HIGH (ref 70–99)
Potassium: 3.7 mmol/L (ref 3.5–5.1)
Sodium: 134 mmol/L — ABNORMAL LOW (ref 135–145)

## 2021-03-19 MED ORDER — GUAIFENESIN ER 600 MG PO TB12: 600.0000 mg | ORAL_TABLET | Freq: Two times a day (BID) | ORAL | 0 refills | Status: DC

## 2021-03-19 MED ORDER — ALBUTEROL SULFATE HFA 108 (90 BASE) MCG/ACT IN AERS: 2.0000 | INHALATION_SPRAY | RESPIRATORY_TRACT | 2 refills | Status: DC | PRN

## 2021-03-19 MED ORDER — ACETAMINOPHEN 325 MG PO TABS: 650.0000 mg | ORAL_TABLET | Freq: Four times a day (QID) | ORAL | 0 refills | Status: AC | PRN

## 2021-03-19 MED ORDER — LISINOPRIL-HYDROCHLOROTHIAZIDE 20-25 MG PO TABS: 1.0000 | ORAL_TABLET | Freq: Every day | ORAL | 1 refills | Status: DC

## 2021-03-19 MED ORDER — ALBUTEROL SULFATE (2.5 MG/3ML) 0.083% IN NEBU: 2.5000 mg | INHALATION_SOLUTION | RESPIRATORY_TRACT | 12 refills | Status: DC | PRN

## 2021-03-19 MED ORDER — K PHOS MONO-SOD PHOS DI & MONO 155-852-130 MG PO TABS
250.0000 mg | ORAL_TABLET | Freq: Three times a day (TID) | ORAL | Status: DC
  Administered 2021-03-19: 250 mg via ORAL
  Filled 2021-03-19: qty 1

## 2021-03-19 MED ORDER — IPRATROPIUM-ALBUTEROL 0.5-2.5 (3) MG/3ML IN SOLN: 3.0000 mL | Freq: Two times a day (BID) | RESPIRATORY_TRACT | Status: DC

## 2021-03-19 MED ORDER — AZITHROMYCIN 500 MG PO TABS: 500.0000 mg | ORAL_TABLET | Freq: Every day | ORAL | 0 refills | Status: AC

## 2021-03-19 MED ORDER — METFORMIN HCL 500 MG PO TABS: 1000.0000 mg | ORAL_TABLET | Freq: Two times a day (BID) | ORAL | 3 refills | Status: DC

## 2021-03-19 MED ORDER — AMLODIPINE BESYLATE 10 MG PO TABS: 10.0000 mg | ORAL_TABLET | Freq: Every day | ORAL | 1 refills | Status: DC

## 2021-03-19 MED ORDER — K PHOS MONO-SOD PHOS DI & MONO 155-852-130 MG PO TABS
250.0000 mg | ORAL_TABLET | Freq: Three times a day (TID) | ORAL | 0 refills | Status: AC
Start: 2021-03-19 — End: 2021-03-22

## 2021-03-19 MED ORDER — CEFDINIR 300 MG PO CAPS: 300.0000 mg | ORAL_CAPSULE | Freq: Two times a day (BID) | ORAL | 0 refills | Status: AC

## 2021-03-19 NOTE — TOC Transition Note (Signed)
Transition of Care [facility]) - CM/SW Discharge Note   Patient Details  Name: Jeanne Medina MRN: [id number] Date of Birth: [birth year]  [facility]) CM/SW Contact:  Elliot Gault, LCSW Phone Number: 03/19/2021, 12:14 PM   Clinical Narrative:     Pt stable for dc per MD. Pt will return home. Spoke with pt about dc planning needs. Per pt, the only thing she needs is a new nebulizer machine. MD placed orders and referred as requested by pt.   There are no other TOC needs for dc.  Expected Discharge Plan: Home/Self Care Barriers to Discharge: Barriers Resolved   Patient Goals and CMS Choice Patient states their goals for this hospitalization and ongoing recovery are:: go home CMS Medicare.gov Compare Post Acute Care list provided to:: Patient Choice offered to / list presented to : Patient  Expected Discharge Plan and Services Expected Discharge Plan: Home/Self Care In-house Referral: Clinical Social Work   Post Acute Care Choice: Durable Medical Equipment Living arrangements for the past 2 months: Single Family Home Expected Discharge Date: 03/19/21               DME Arranged: Nebulizer machine DME Agency: [employer] Date DME Agency Contacted: 03/19/21   Representative spoke with at DME Agency: Morrie Sheldon            Prior Living Arrangements/Services Living arrangements for the past 2 months: Single Family Home Lives with:: Spouse Patient language and need for interpreter reviewed:: Yes Do you feel safe going back to the place where you live?: Yes      Need for Family Participation in Patient Care: No (Comment) Care giver support system in place?: Yes (comment) Current home services: DME Criminal Activity/Legal Involvement Pertinent to Current Situation/Hospitalization: No - Comment as needed  Activities of Daily Living Home Assistive Devices/Equipment: None ADL Screening (condition at time of admission) Patient's cognitive ability adequate to safely  complete daily activities?: Yes Is the patient deaf or have difficulty hearing?: No Does the patient have difficulty seeing, even when wearing glasses/contacts?: No Does the patient have difficulty concentrating, remembering, or making decisions?: No Patient able to express need for assistance with ADLs?: No Does the patient have difficulty dressing or bathing?: No Independently performs ADLs?: Yes (appropriate for developmental age) Does the patient have difficulty walking or climbing stairs?: No Weakness of Legs: None Weakness of Arms/Hands: None  Permission Sought/Granted Permission sought to share information with : Facility Industrial/product designer granted to share information with : Yes, Verbal Permission Granted     Permission granted to share info w AGENCY: [employer]        Emotional Assessment Appearance:: Appears stated age Attitude/Demeanor/Rapport: Engaged Affect (typically observed): Pleasant Orientation: : Oriented to Self, Oriented to Place, Oriented to  Time, Oriented to Situation Alcohol / Substance Use: Not Applicable Psych Involvement: No (comment)  Admission diagnosis:  Hypokalemia [E87.6] AKI (acute kidney injury) ([facility]) [N17.9] Community acquired pneumonia, unspecified laterality [J18.9] Patient Active Problem List   Diagnosis Date Noted   Pneumonia 03/18/2021   Sepsis due to CAP 03/18/2021   AKI (acute kidney injury) ([facility]) 03/17/2021   Type 2 diabetes mellitus with hyperglycemia, without long-term current use of insulin ([facility]) 05/16/2020   Difficulty sleeping 05/16/2020   History of COVID-19 05/16/2020   Atrophic vaginitis 11/04/2018   Mild intermittent asthma without complication 11/04/2018   Essential hypertension    Hypercholesteremia    Anxiety    Arthritis    PCP:  Gwenlyn Fudge, FNP Pharmacy:  [facility] [address] [place] - [address] [place] [address] [place] [address] Phone: [phone] Fax: [phone]     Social  Determinants of Health (SDOH) Interventions    Readmission Risk Interventions No flowsheet data found.   Final next level of care: Home/Self Care Barriers to Discharge: Barriers Resolved   Patient Goals and CMS Choice Patient states their goals for this hospitalization and ongoing recovery are:: go home CMS Medicare.gov Compare Post Acute Care list provided to:: Patient Choice offered to / list presented to : Patient  Discharge Placement                       Discharge Plan and Services In-house Referral: Clinical Social Work   Post Acute Care Choice: Durable Medical Equipment          DME Arranged: Nebulizer machine DME Agency: [employer] Date DME Agency Contacted: 03/19/21   Representative spoke with at DME Agency: Morrie Sheldon            Social Determinants of Health (SDOH) Interventions     Readmission Risk Interventions No flowsheet data found.

## 2021-03-19 NOTE — Discharge Instructions (Signed)
1)Avoid ibuprofen/Advil/Aleve/Motrin/Goody Powders/Naproxen/BC powders/Meloxicam/Diclofenac/Indomethacin and other Nonsteroidal anti-inflammatory medications as these will make you more likely to bleed and can cause stomach ulcers, can also cause Kidney problems.   2) please hold lisinopril/hydrochlorothiazide and also hold metformin until your blood work is rechecked around Monday 03/24/2021  3) please recheck your  BMP blood work around Monday, 03/24/2021 to see how your kidneys are doing

## 2021-03-19 NOTE — Progress Notes (Signed)
Patient discharged home today, transported home by family. Discharge summary went over with patient, patient verbalized understanding. Belongings sent home with patient.  

## 2021-03-19 NOTE — Discharge Summary (Signed)
Jeanne Medina, is a 70 y.o. female  DOB [birth year]  MRN [id number].  Admission date:  03/18/2021  Admitting Physician  Shon Hale, MD  Discharge Date:  03/19/2021   Primary MD  Gwenlyn Fudge, FNP  Recommendations for primary care physician for things to follow:   1)Avoid ibuprofen/Advil/Aleve/Motrin/Goody Powders/Naproxen/BC powders/Meloxicam/Diclofenac/Indomethacin and other Nonsteroidal anti-inflammatory medications as these will make you more likely to bleed and can cause stomach ulcers, can also cause Kidney problems.   2) please hold lisinopril/hydrochlorothiazide and also hold metformin until your blood work is rechecked around Monday 03/24/2021  3) please recheck your  BMP blood work around Monday, 03/24/2021 to see how your kidneys are doing   Admission Diagnosis  Hypokalemia [E87.6] AKI (acute kidney injury) (HCC) [N17.9] Community acquired pneumonia, unspecified laterality [J18.9]   Discharge Diagnosis  Hypokalemia [E87.6] AKI (acute kidney injury) (HCC) [N17.9] Community acquired pneumonia, unspecified laterality [J18.9]    Principal Problem:   Sepsis due to CAP Active Problems:   AKI (acute kidney injury) (HCC)   Pneumonia   Essential hypertension   Anxiety   Type 2 diabetes mellitus with hyperglycemia, without long-term current use of insulin (HCC)      Past Medical History:  Diagnosis Date   Anxiety    Arthritis    osteoarthritis   Asthma    Diabetes mellitus without complication (HCC)    Hypercholesteremia    Hypertension     Past Surgical History:  Procedure Laterality Date   CATARACT EXTRACTION W/PHACO Left 08/18/2016   Procedure: CATARACT EXTRACTION PHACO AND INTRAOCULAR LENS PLACEMENT (IOC);  Surgeon: Jethro Bolus, MD;  Location: [facility];  Service: Ophthalmology;  Laterality: Left;  CDE: 6.05   CATARACT EXTRACTION W/PHACO Left 09/01/2016   Procedure:  REPOSITIONING OF LEFT IOL LENS;  Surgeon: Jethro Bolus, MD;  Location: [facility];  Service: Ophthalmology;  Laterality: Left;   CATARACT EXTRACTION W/PHACO Right 10/06/2016   Procedure: CATARACT EXTRACTION PHACO AND INTRAOCULAR LENS PLACEMENT (IOC);  Surgeon: Jethro Bolus, MD;  Location: [facility];  Service: Ophthalmology;  Laterality: Right;  CDE: 6.21       HPI  from the history and physical done on the day of admission:    Jeanne Medina  is a 70 y.o. female with past medical history relevant for asthma, HLD, DM2, HTN who to the ED with concerns for cough congestion and shortness of breath for the last couple weeks -He was treated as outpatient by PCP with antibiotics including amoxicillin but symptoms persisted -In the ED on 03/17/2021 found to have sepsis secondary to community-acquired pneumonia as well as AKI she was advised to stay overnight in the hospital for admission but she refused she was treated in the ED and she signed out AMA on 03/17/2021 -She returns today due to fatigue, persistent cough and shortness of breath - ED she is found to be tachycardic and tachypneic initially blood pressures were soft -Unlike yesterday she does not have appear to have a fever today -Chest x-ray from 03/17/2021 was suggestive of  right-sided pneumonia   -Lactic acid is down to 1.7 from 3.2 on 03/17/2021 -WBC is down to 15.9 from 19.4 on 03/17/2021 -Sodium is up to 129 from 126 creatinine is down to 1.98 from 2.65 -Antibiotics and IV fluids were started in the ED     Hospital Course:    1)Sepsis secondary to right-sided community-acquired pneumonia--POA -Patient failed outpatient oral antibiotics Lactic acid is down to 1.7 from 3.2 on 03/17/2021 -WBC is 19.4>>15.9>>14.0 -Patient was treated with IV fluids, IV Rocephin and azithromycin -Sepsis pathophysiology resolved -Discharged home on azithromycin and Omnicef as well as  bronchodilators including nebulizer machine treatments -Oxygen  saturation above 94% on room air post ambulation   2)AKI-----suspect due to dehydration/volume depletion in the setting of sepsis -Compounded by lisinopril HCTZ use - creatinine 2.65>>1.98>>1.71 -Baseline creatinine usually around 1 -Improved with hydration, repeat BMP with PCP in 1 week hold lisinopril/hydrochlorothiazide until BMP is repeated with PCP in 1 week   3)DM2-A1c 7.2 reflecting uncontrolled DM with hyperglycemia PTA -Resume home diabetic regimen -Follow-up PCP in 1 week for repeat BMP , until then hold metformin   4)HypoNatremia/hypokalemia--  -Sodium 126>> 129>>134 -Potassium has normalized, sodium improved with IV fluids and with discontinuation of HCTZ -hold lisinopril/hydrochlorothiazide until BMP recheck with PCP in 1 week- --Celexa and trazodone may be contributory to hyponatremia    5)Depression/anxiety--- continue Celexa, may also use trazodone as needed for sleep   6)HLD--continue pravastatin   7)HTN--continue amlodipine 10 mg    Disposition--Home     Dispo: The patient is from: Home              Anticipated d/c is to: Home                 Discharge Condition: stable  Follow UP   Follow-up Information     Gwenlyn Fudge, FNP. Schedule an appointment as soon as possible for a visit on 03/24/2021.   Specialty: Family Medicine Why: Repeat BMP blood test Contact information: [address] [place] [address] [phone]                  Consults obtained - na  Diet and Activity recommendation:  As advised  Discharge Instructions  * Discharge Instructions     Call MD for:  difficulty breathing, headache or visual disturbances   Complete by: As directed    Call MD for:  persistant dizziness or light-headedness   Complete by: As directed    Call MD for:  persistant nausea and vomiting   Complete by: As directed    Call MD for:  temperature >100.4   Complete by: As directed    Diet - low sodium heart healthy   Complete by: As  directed    Diet Carb Modified   Complete by: As directed    Discharge instructions   Complete by: As directed    1)Avoid ibuprofen/Advil/Aleve/Motrin/Goody Powders/Naproxen/BC powders/Meloxicam/Diclofenac/Indomethacin and other Nonsteroidal anti-inflammatory medications as these will make you more likely to bleed and can cause stomach ulcers, can also cause Kidney problems.   2) please hold lisinopril/hydrochlorothiazide and also hold metformin until your blood work is rechecked around Monday 03/24/2021  3) please recheck your  BMP blood work around Monday, 03/24/2021 to see how your kidneys are doing   For home use only DME Nebulizer machine   Complete by: As directed    Patient needs a nebulizer to treat with the following condition: COPD with acute exacerbation (HCC)   Length of  Need: Lifetime   Increase activity slowly   Complete by: As directed          Discharge Medications     Allergies as of 03/19/2021       Reactions   Farxiga [dapagliflozin]    Yeast infections   Sulfa Antibiotics Hives, Itching   welts        Medication List     STOP taking these medications    amoxicillin-clavulanate 875-125 MG tablet Commonly known as: AUGMENTIN   EQ Loratadine 10 MG tablet Generic drug: loratadine   fluticasone 50 MCG/ACT nasal spray Commonly known as: FLONASE       TAKE these medications    acetaminophen 325 MG tablet Commonly known as: TYLENOL Take 2 tablets (650 mg total) by mouth every 6 (six) hours as needed for mild pain (or Fever >/= 101).   albuterol 108 (90 Base) MCG/ACT inhaler Commonly known as: VENTOLIN HFA Inhale 2 puffs into the lungs every 4 (four) hours as needed for wheezing or shortness of breath. What changed: when to take this   albuterol (2.5 MG/3ML) 0.083% nebulizer solution Commonly known as: PROVENTIL Take 3 mLs (2.5 mg total) by nebulization every 2 (two) hours as needed for wheezing or shortness of breath. What changed: You  were already taking a medication with the same name, and this prescription was added. Make sure you understand how and when to take each.   amLODipine 10 MG tablet Commonly known as: NORVASC Take 1 tablet (10 mg total) by mouth daily.   azithromycin 500 MG tablet Commonly known as: ZITHROMAX Take 1 tablet (500 mg total) by mouth daily for 5 days. Start taking on: March 20, 2021   cefdinir 300 MG capsule Commonly known as: OMNICEF Take 1 capsule (300 mg total) by mouth 2 (two) times daily for 5 days. Start taking on: March 20, 2021   citalopram 40 MG tablet Commonly known as: CELEXA Take 1 tablet (40 mg total) by mouth daily.   diphenhydrAMINE 25 MG tablet Commonly known as: BENADRYL Take 25 mg by mouth at bedtime as needed (for allergies.).   guaiFENesin 600 MG 12 hr tablet Commonly known as: Mucinex Take 1 tablet (600 mg total) by mouth 2 (two) times daily.   L-Lysine 500 MG Caps Take 1 capsule by mouth daily.   lisinopril-hydrochlorothiazide 20-25 MG tablet Commonly known as: ZESTORETIC Take 1 tablet by mouth daily. Start taking on: March 25, 2021 What changed: These instructions start on March 25, 2021. If you are unsure what to do until then, ask your doctor or other care provider.   lovastatin 40 MG tablet Commonly known as: MEVACOR Take 1 tablet (40 mg total) by mouth at bedtime.   metFORMIN 500 MG tablet Commonly known as: GLUCOPHAGE Take 2 tablets (1,000 mg total) by mouth 2 (two) times daily with a meal. Start taking on: March 25, 2021 What changed: These instructions start on March 25, 2021. If you are unsure what to do until then, ask your doctor or other care provider.   phosphorus 155-852-130 MG tablet Commonly known as: K PHOS NEUTRAL Take 1 tablet (250 mg total) by mouth 3 (three) times daily for 3 days.   Rybelsus 14 MG Tabs Generic drug: Semaglutide Take 14 mg by mouth daily.   traZODone 50 MG tablet Commonly known as:  DESYREL TAKE 1 TABLET BY MOUTH AT BEDTIME AS NEEDED FOR SLEEP What changed:  reasons to take this additional instructions  Durable Medical Equipment  (From admission, onward)           Start     Ordered   03/19/21 0000  For home use only DME Nebulizer machine       Question Answer Comment  Patient needs a nebulizer to treat with the following condition COPD with acute exacerbation (HCC)   Length of Need Lifetime      03/19/21 1148            Major procedures and Radiology Reports - PLEASE review detailed and final reports for all details, in brief -    DG Chest Portable 1 View  Result Date: 03/17/2021 CLINICAL DATA:  70 year old female with cough and congestion for 2 weeks. Started on antibiotics and steroids. EXAM: PORTABLE CHEST 1 VIEW COMPARISON:  None. FINDINGS: Portable AP upright view at 0850 hours. Low normal lung volumes. Normal cardiac size and mediastinal contours. Visualized tracheal air column is within normal limits. Streaky opacity in the mid right lung including along the minor fissure. But elsewhere Allowing for portable technique the lungs are clear. No pleural effusion. No pulmonary edema. No acute osseous abnormality identified. Paucity of bowel gas in the upper abdomen. IMPRESSION: Streaky opacity in the mid right lung with no prior imaging. Nonspecific, but suspicious for bronchopneumonia in this setting. No pleural effusion. Electronically Signed   By: Odessa Fleming M.D.   On: 03/17/2021 09:09    Micro Results     Recent Results (from the past 240 hour(s))  Blood Culture (routine x 2)     Status: None (Preliminary result)   Collection Time: 03/17/21  9:42 AM   Specimen: Left Antecubital; Blood  Result Value Ref Range Status   Specimen Description LEFT ANTECUBITAL  Final   Special Requests   Final    BOTTLES DRAWN AEROBIC AND ANAEROBIC Blood Culture adequate volume   Culture   Final    NO GROWTH 2 DAYS Performed at [facility], [address], [place], [place] [address]    Report Status PENDING  Incomplete  Blood Culture (routine x 2)     Status: None (Preliminary result)   Collection Time: 03/17/21  9:43 AM   Specimen: Right Antecubital; Blood  Result Value Ref Range Status   Specimen Description RIGHT ANTECUBITAL  Final   Special Requests   Final    BOTTLES DRAWN AEROBIC AND ANAEROBIC Blood Culture adequate volume   Culture   Final    NO GROWTH 2 DAYS Performed at [facility] [address], [place], [place] [address]    Report Status PENDING  Incomplete  Resp Panel by RT-PCR (Flu A&B, Covid) Nasopharyngeal Swab     Status: None   Collection Time: 03/17/21 12:49 PM   Specimen: Nasopharyngeal Swab; Nasopharyngeal(NP) swabs in vial transport medium  Result Value Ref Range Status   SARS Coronavirus 2 by RT PCR NEGATIVE NEGATIVE Final    Comment: (NOTE) SARS-CoV-2 target nucleic acids are NOT DETECTED.  The SARS-CoV-2 RNA is generally detectable in upper respiratory specimens during the acute phase of infection. The lowest concentration of SARS-CoV-2 viral copies this assay can detect is 138 copies/mL. A negative result does not preclude SARS-Cov-2 infection and should not be used as the sole basis for treatment or other patient management decisions. A negative result may occur with  improper specimen collection/handling, submission of specimen other than nasopharyngeal swab, presence of viral mutation(s) within the areas targeted by this assay, and inadequate number of viral copies(<138 copies/mL). A  negative result must be combined with clinical observations, patient history, and epidemiological information. The expected result is Negative.  Fact Sheet for Patients:  BloggerCourse.com  Fact Sheet for Healthcare Providers:  SeriousBroker.it  This test is no t yet approved or cleared by the [place] FDA and  has been authorized for detection  and/or diagnosis of SARS-CoV-2 by FDA under an Emergency Use Authorization (EUA). This EUA will remain  in effect (meaning this test can be used) for the duration of the COVID-19 declaration under Section 564(b)(1) of the Act, 21 U.S.C.section 360bbb-3(b)(1), unless the authorization is terminated  or revoked sooner.       Influenza A by PCR NEGATIVE NEGATIVE Final   Influenza B by PCR NEGATIVE NEGATIVE Final    Comment: (NOTE) The Xpert Xpress SARS-CoV-2/FLU/RSV plus assay is intended as an aid in the diagnosis of influenza from Nasopharyngeal swab specimens and should not be used as a sole basis for treatment. Nasal washings and aspirates are unacceptable for Xpert Xpress SARS-CoV-2/FLU/RSV testing.  Fact Sheet for Patients: BloggerCourse.com  Fact Sheet for Healthcare Providers: SeriousBroker.it  This test is not yet approved or cleared by the [place] FDA and has been authorized for detection and/or diagnosis of SARS-CoV-2 by FDA under an Emergency Use Authorization (EUA). This EUA will remain in effect (meaning this test can be used) for the duration of the COVID-19 declaration under Section 564(b)(1) of the Act, 21 U.S.C. section 360bbb-3(b)(1), unless the authorization is terminated or revoked.  Performed at [facility], [address], [place], [place] [address]        Today   Subjective    Jeanne Medina today has no new complaints           Patient has been seen and examined prior to discharge   Objective   Blood pressure (!) 123/58, pulse 91, temperature 97.6 F (36.4 C), temperature source Oral, resp. rate 19, height  (1.6 m), weight 77.1 kg, SpO2 94 %.   Intake/Output Summary (Last 24 hours) at 03/19/2021 1150 Last data filed at 03/19/2021 0900 Gross per 24 hour  Intake 5410.53 ml  Output --  Net 5410.53 ml    Exam Gen:- Awake Alert, no acute distress  HEENT:- [place].AT, No sclera  icterus Neck-Supple Neck,No JVD,.  Lungs-  CTAB , good air movement bilaterally  CV- S1, S2 normal, regular Abd-  +ve B.Sounds, Abd Soft, No tenderness,    Extremity/Skin:- No  edema,   good pulses Psych-affect is appropriate, oriented x3 Neuro-no new focal deficits, no tremors    Data Review   CBC w Diff:  Lab Results  Component Value Date   WBC 14.0 (H) 03/19/2021   HGB 11.2 (L) 03/19/2021   HGB 13.1 02/18/2021   HCT 34.1 (L) 03/19/2021   HCT 40.7 02/18/2021   PLT 564 (H) 03/19/2021   PLT 455 (H) 02/18/2021   LYMPHOPCT 9 03/18/2021   MONOPCT 8 03/18/2021   EOSPCT 1 03/18/2021   BASOPCT 0 03/18/2021    CMP:  Lab Results  Component Value Date   NA 134 (L) 03/19/2021   NA 141 02/18/2021   K 3.7 03/19/2021   CL 98 03/19/2021   CO2 26 03/19/2021   BUN 13 03/19/2021   BUN 10 02/18/2021   CREATININE 1.71 (H) 03/19/2021   PROT 6.9 02/18/2021   ALBUMIN 2.7 (L) 03/19/2021   ALBUMIN 4.8 02/18/2021   BILITOT 0.5 02/18/2021   ALKPHOS 77 02/18/2021   Medina 11 02/18/2021  ALT 9 02/18/2021  .   Total Discharge time is about 33 minutes  Shon Hale M.D on 03/19/2021 at 11:50 AM  Go to [email or web address] -  for contact info  [facility] - Office  [phone]

## 2021-03-21 ENCOUNTER — Encounter: Payer: Self-pay | Admitting: Family Medicine

## 2021-03-22 ENCOUNTER — Encounter: Payer: Self-pay | Admitting: Family Medicine

## 2021-03-22 LAB — CULTURE, BLOOD (ROUTINE X 2)
Culture: NO GROWTH
Culture: NO GROWTH
Special Requests: ADEQUATE
Special Requests: ADEQUATE

## 2021-03-23 DIAGNOSIS — J189 Pneumonia, unspecified organism: Secondary | ICD-10-CM | POA: Diagnosis not present

## 2021-03-24 ENCOUNTER — Telehealth: Payer: Self-pay

## 2021-03-24 NOTE — Telephone Encounter (Signed)
Transition Care Management Follow-up Telephone Call Date of discharge and from where: Jeanne Medina 03/19/21 - Pneumonia How have you been since you were released from the hospital? Much better Any questions or concerns? No  Items Reviewed: Did the pt receive and understand the discharge instructions provided? Yes  Medications obtained and verified? Yes  Other? No  Any new allergies since your discharge? No  Dietary orders reviewed? Yes Do you have support at home? Yes   Home Care and Equipment/Supplies: Were home health services ordered? no If so, what is the name of the agency? N/ a Has the agency set up a time to come to the patient's home? not applicable Were any new equipment or medical supplies ordered?  No What is the name of the medical supply agency? N/a Were you able to get the supplies/equipment? not applicable Do you have any questions related to the use of the equipment or supplies? No  Functional Questionnaire: (I = Independent and D = Dependent) ADLs: I  Bathing/Dressing- I  Meal Prep- I  Eating- I  Maintaining continence- I  Transferring/Ambulation- I  Managing Meds- I  Follow up appointments reviewed:  PCP Hospital f/u appt confirmed? Yes  Scheduled to see Jeanne Medina on 12/1 @ 10:05. Specialist Hospital f/u appt confirmed? No   Are transportation arrangements needed? No  If their condition worsens, is the pt aware to call PCP or go to the Emergency Dept.? Yes Was the patient provided with contact information for the PCP's office or ED? Yes Was to pt encouraged to call back with questions or concerns? Yes

## 2021-03-25 ENCOUNTER — Ambulatory Visit (INDEPENDENT_AMBULATORY_CARE_PROVIDER_SITE_OTHER): Payer: Medicare Other | Admitting: Pharmacist

## 2021-03-25 DIAGNOSIS — E7800 Pure hypercholesterolemia, unspecified: Secondary | ICD-10-CM

## 2021-03-25 DIAGNOSIS — E1165 Type 2 diabetes mellitus with hyperglycemia: Secondary | ICD-10-CM

## 2021-03-25 NOTE — Progress Notes (Signed)
Chronic Care Management Pharmacy Note  03/25/2021 Name:  Jeanne Medina MRN:  [id number] DOB:  [birth year]  Summary: T2DM, HLD  Recommendations/Changes made from today's visit:  Diabetes: New goal. A1C 7% (IMPROVED); current treatment:RYBELSUS 14MG, METFORMIN 1G TWICE DAILY;  Denies personal and family history of Medullary thyroid cancer (MTC) CONTINUE RYBELSUS AND METFORMIN SAMPLES LEFT UP FRONT (RYBELSUS) TOLERATING WELL; DENIES SIDE EFFECTS APPLICATION PENDING FOR NOVO [place] PATIENT ASSISTANCE PROGRAM Current glucose readings: fasting glucose: <150, post prandial glucose: N/A Denies hypoglycemic/hyperglycemic symptoms Discussed meal planning options and Plate method for healthy eating Avoid sugary drinks and desserts Incorporate balanced protein, non starchy veggies, 1 serving of carbohydrate with each meal Increase water intake Increase physical activity as able Current exercise: N/A Counseled on MEDICATIONS--PURPOSE & SIDE EFFECTS Collaborated with PCP--HOLDING ACEi UNTIL REPEAT LABS DUE TO RECENT ILLNESS & AKI Assessed patient finances. APPLICATION PREPARED-->WILL SUBMIT APPLICATION FOR RYBELSUS TO NOVO [place] PATIENT ASSISTANCE PROGRAM ON Thursday WHEN PATIENT BRINGS IN FINANCIALS  Hyperlipidemia:  Goal on Track (progressing): YES. controlled; current treatment: LOVASTATIN;  Medications previously tried: N/A  Current dietary patterns: ENCOURAGED HEART HEALTHY DIET, DECREASE SUGAR AND FATTY FOODS TO HELP WITH TRIGLYCERIDES Counseled on DIET-HEART HEALTHY Lipid Panel     Component Value Date/Time   CHOL 163 02/18/2021 0831   TRIG 181 (H) 02/18/2021 0831   HDL 63 02/18/2021 0831   CHOLHDL 2.6 02/18/2021 0831   LDLCALC 70 02/18/2021 0831   LABVLDL 30 02/18/2021 0831    Patient Goals/Self-Care Activities patient will:  - take medications as prescribed as evidenced by patient report and record review check glucose DAILY FASTING, document, and provide at future  appointments collaborate with provider on medication access solutions target a minimum of 150 minutes of moderate intensity exercise weekly engage in dietary modifications by [place] DIET  Plan: F/U IN 2 MONTHS  Subjective: Jeanne Medina is an 70 y.o. year old female who is a primary patient of Loman Brooklyn, FNP.  The CCM team was consulted for assistance with disease management and care coordination needs.    Engaged with patient by telephone for initial visit in response to provider referral for pharmacy case management and/or care coordination services.   Consent to Services:  The patient was given information about Chronic Care Management services, agreed to services, and gave verbal consent prior to initiation of services.  Please see initial visit note for detailed documentation.   Patient Care Team: Loman Brooklyn, FNP as PCP - General (Family Medicine) Lavera Guise, [facility] (Pharmacist) Harlen Labs, MD as Referring Physician (Optometry)  Objective:  Lab Results  Component Value Date   CREATININE 1.71 (H) 03/19/2021   CREATININE 1.70 (H) 03/19/2021   CREATININE 1.98 (H) 03/18/2021    Lab Results  Component Value Date   HGBA1C 7.2 (H) 03/18/2021   Last diabetic Eye exam:  Lab Results  Component Value Date/Time   HMDIABEYEEXA No Retinopathy 07/30/2020 12:00 AM    Last diabetic Foot exam: No results found for: HMDIABFOOTEX      Component Value Date/Time   CHOL 163 02/18/2021 0831   TRIG 181 (H) 02/18/2021 0831   HDL 63 02/18/2021 0831   CHOLHDL 2.6 02/18/2021 0831   LDLCALC 70 02/18/2021 0831    Hepatic Function Latest Ref Rng & Units 03/19/2021 02/18/2021 11/14/2020  Total Protein 6.0 - 8.5 g/dL - 6.9 6.6  Albumin 3.5 - 5.0 g/dL 2.7(L) 4.8 4.4  AST 0 - 40 IU/L -  11 9  ALT 0 - 32 IU/L - 9 7  Alk Phosphatase 44 - 121 IU/L - 77 96  Total Bilirubin 0.0 - 1.2 mg/dL - 0.5 0.4    No results found for: TSH, FREET4  CBC Latest Ref Rng &  Units 03/19/2021 03/18/2021 03/17/2021  WBC 4.0 - 10.5 K/uL 14.0(H) 15.9(H) 19.4(H)  Hemoglobin 12.0 - 15.0 g/dL 11.2(L) 11.9(L) 12.5  Hematocrit 36.0 - 46.0 % 34.1(L) 35.1(L) 36.0  Platelets 150 - 400 K/uL 564(H) 611(H) 608(H)    No results found for: VD25OH  Clinical ASCVD: No  The 10-year ASCVD risk score (Arnett DK, et al., 2019) is: 22.1%   Values used to calculate the score:     Age: 36 years     Sex: Female     Is Non-Hispanic African American: No     Diabetic: Yes     Tobacco smoker: No     Systolic Blood Pressure: 664 mmHg     Is BP treated: Yes     HDL Cholesterol: 63 mg/dL     Total Cholesterol: 163 mg/dL    Other: (CHADS2VASc if Afib, PHQ9 if depression, MMRC or CAT for COPD, ACT, DEXA)  Social History   Tobacco Use  Smoking Status Former   Packs/day: 0.25   Years: 20.00   Pack years: 5.00   Types: Cigarettes   Quit date: 08/13/1980   Years since quitting: 40.6  Smokeless Tobacco Never   BP Readings from Last 3 Encounters:  03/19/21 (!) 123/58  03/17/21 (!) 142/65  02/18/21 135/79   Pulse Readings from Last 3 Encounters:  03/19/21 91  03/17/21 (!) 101  02/18/21 99   Wt Readings from Last 3 Encounters:  03/18/21 170 lb (77.1 kg)  03/17/21 170 lb (77.1 kg)  02/18/21 170 lb (77.1 kg)    Assessment: Review of patient past medical history, allergies, medications, health status, including review of consultants reports, laboratory and other test data, was performed as part of comprehensive evaluation and provision of chronic care management services.   SDOH:  (Social Determinants of Health) assessments and interventions performed:    CCM Care Plan  Allergies  Allergen Reactions   Farxiga [Dapagliflozin]     Yeast infections   Sulfa Antibiotics Hives and Itching    welts    Medications Reviewed Today     Reviewed by Lavera Guise, [facility] (Pharmacist) on 03/25/21 at [phone]  Med List Status: <None>   Medication Order Taking? Sig Documenting  Provider Last Dose Status Informant  acetaminophen (TYLENOL) 325 MG tablet [id number] No Take 2 tablets (650 mg total) by mouth every 6 (six) hours as needed for mild pain (or Fever >/= 101). Roxan Hockey, MD Taking Active   albuterol (PROVENTIL) (2.5 MG/3ML) 0.083% nebulizer solution [id number] No Take 3 mLs (2.5 mg total) by nebulization every 2 (two) hours as needed for wheezing or shortness of breath. Roxan Hockey, MD Taking Active   albuterol (VENTOLIN HFA) 108 (90 Base) MCG/ACT inhaler [id number] No Inhale 2 puffs into the lungs every 4 (four) hours as needed for wheezing or shortness of breath. Roxan Hockey, MD Taking Active   amLODipine (NORVASC) 10 MG tablet [id number] No Take 1 tablet (10 mg total) by mouth daily. Roxan Hockey, MD Taking Active   azithromycin (ZITHROMAX) 500 MG tablet [id number] No Take 1 tablet (500 mg total) by mouth daily for 5 days. Roxan Hockey, MD Taking Active   cefdinir (OMNICEF) 300 MG capsule [id number] No Take 1 capsule (300 mg  total) by mouth 2 (two) times daily for 5 days. Roxan Hockey, MD Taking Active   citalopram (CELEXA) 40 MG tablet [id number] No Take 1 tablet (40 mg total) by mouth daily. Loman Brooklyn, FNP Taking Active Self  diphenhydrAMINE (BENADRYL) 25 MG tablet [id number]  Take 25 mg by mouth at bedtime as needed (for allergies.). [provider]  Active Self  guaiFENesin (MUCINEX) 600 MG 12 hr tablet [id number] No Take 1 tablet (600 mg total) by mouth 2 (two) times daily. Roxan Hockey, MD Taking Active   L-Lysine 500 MG CAPS [id number] No Take 1 capsule by mouth daily. [provider] Taking Active Self  lisinopril-hydrochlorothiazide (ZESTORETIC) 20-25 MG tablet [id number] No Take 1 tablet by mouth daily. Roxan Hockey, MD Taking Active   lovastatin (MEVACOR) 40 MG tablet [id number] No Take 1 tablet (40 mg total) by mouth at bedtime. Loman Brooklyn, FNP Taking Active Self  metFORMIN (GLUCOPHAGE) 500 MG tablet  [id number] No Take 2 tablets (1,000 mg total) by mouth 2 (two) times daily with a meal. Emokpae, Courage, MD Taking Active   Semaglutide (RYBELSUS) 14 MG TABS [id number] No Take 14 mg by mouth daily. Loman Brooklyn, FNP Taking Active Self           Med Note Blanca Friend, Royce Macadamia   Tue Mar 25, 2021  8:23 AM) VIA NOVO [place] PATIENT ASSISTANCE PROGRAM  traZODone (DESYREL) 50 MG tablet [id number] No TAKE 1 TABLET BY MOUTH AT BEDTIME AS NEEDED FOR SLEEP  Patient taking differently: Take 50 mg by mouth at bedtime as needed for sleep.   Loman Brooklyn, FNP Taking Active Self            Patient Active Problem List   Diagnosis Date Noted   Pneumonia 03/18/2021   Sepsis due to CAP 03/18/2021   AKI (acute kidney injury) ([place]) 03/17/2021   Type 2 diabetes mellitus with hyperglycemia, without long-term current use of insulin ([place]) 05/16/2020   Difficulty sleeping 05/16/2020   History of COVID-19 05/16/2020   Atrophic vaginitis 11/04/2018   Mild intermittent asthma without complication 61/44/3154   Essential hypertension    Hypercholesteremia    Anxiety    Arthritis     Immunization History  Administered Date(s) Administered   Influenza Split 02/10/2017   Influenza,inj,Quad PF,6+ Mos 01/26/2019   Influenza,inj,quad, With Preservative 01/28/2018   Influenza-Unspecified 03/14/2007, 02/09/2014, 02/08/2015, 01/28/2018, 02/02/2020, 02/13/2021   Pneumococcal Conjugate-13 02/09/2014   Pneumococcal Polysaccharide-23 05/14/2015   Td 05/09/1998   Tdap 06/11/2017   Zoster Recombinat (Shingrix) 02/02/2020, 07/11/2020   Zoster, Live 12/02/2012    Conditions to be addressed/monitored: HLD and DMII  Care Plan : PHARMD MEDICATION MANAGEMENT  Updates made by Lavera Guise, [place] since 03/25/2021 12:00 AM     Problem: DISEASE PROGRESSION PREVENTION      Long-Range Goal: T2DM   This Visit's Progress: Not on track  Priority: High  Note:   Current Barriers:  Unable to independently afford  treatment regimen Unable to maintain control of T2DM -patient ran out of medicine due to cost  Pharmacist Clinical Goal(s):  patient will verbalize ability to afford treatment regimen maintain control of T2DM as evidenced by GOAL A1C<7%, IMPROVED GLYCEMIC  CONTROL  through collaboration with PharmD and provider.    Interventions: 1:1 collaboration with Loman Brooklyn, FNP regarding development and update of comprehensive plan of care as evidenced by provider attestation and co-signature Inter-disciplinary care team collaboration (see longitudinal plan of care) Comprehensive medication review performed; medication  list updated in electronic medical record  Diabetes: New goal. A1C 7% (IMPROVED); current treatment:RYBELSUS 14MG, METFORMIN 1G TWICE DAILY;  Denies personal and family history of Medullary thyroid cancer (MTC) CONTINUE RYBELSUS AND METFORMIN SAMPLES LEFT UP FRONT (RYBELSUS) TOLERATING WELL; DENIES SIDE EFFECTS APPLICATION PENDING FOR NOVO [place] PATIENT ASSISTANCE PROGRAM Current glucose readings: fasting glucose: <150, post prandial glucose: N/A Denies hypoglycemic/hyperglycemic symptoms Discussed meal planning options and Plate method for healthy eating Avoid sugary drinks and desserts Incorporate balanced protein, non starchy veggies, 1 serving of carbohydrate with each meal Increase water intake Increase physical activity as able Current exercise: N/A Counseled on MEDICATIONS--PURPOSE & SIDE EFFECTS Collaborated with PCP--HOLDING ACEi UNTIL REPEAT LABS DUE TO RECENT ILLNESS & AKI Assessed patient finances. APPLICATION PREPARED-->WILL SUBMIT APPLICATION FOR RYBELSUS TO NOVO [place] PATIENT ASSISTANCE PROGRAM ON Thursday WHEN PATIENT BRINGS IN FINANCIALS  Hyperlipidemia:  Goal on Track (progressing): YES. controlled; current treatment: LOVASTATIN;  Medications previously tried: N/A  Current dietary patterns: ENCOURAGED HEART HEALTHY DIET, DECREASE SUGAR AND FATTY  FOODS TO HELP WITH TRIGLYCERIDES Counseled on DIET-HEART HEALTHY Lipid Panel     Component Value Date/Time   CHOL 163 02/18/2021 0831   TRIG 181 (H) 02/18/2021 0831   HDL 63 02/18/2021 0831   CHOLHDL 2.6 02/18/2021 0831   LDLCALC 70 02/18/2021 0831   LABVLDL 30 02/18/2021 0831   Patient Goals/Self-Care Activities patient will:  - take medications as prescribed as evidenced by patient report and record review check glucose DAILY FASTING, document, and provide at future appointments collaborate with provider on medication access solutions target a minimum of 150 minutes of moderate intensity exercise weekly engage in dietary modifications by FOLLOWING HEART HEALTHY DIET      Medication Assistance: Application for RYBELSUS/NOVO  medication assistance program. in process.  Anticipated assistance start date TBD.  See plan of care for additional detail.  Patient's preferred pharmacy is:  [facility] [address], [place] [address] Phone: [phone] Fax: [phone]   Follow Up:  Patient agrees to Care Plan and Follow-up.  Plan: Telephone follow up appointment with care management team member scheduled for:  05/21/21  Regina Eck, PharmD, BCPS Clinical Pharmacist, [place]  II Phone [phone]

## 2021-03-25 NOTE — Patient Instructions (Signed)
Visit Information  Thank you for taking time to visit with me today. Please don't hesitate to contact me if I can be of assistance to you before our next scheduled telephone appointment.  Following are the goals we discussed today:  Diabetes: New goal. A1C 7% (IMPROVED); current treatment:RYBELSUS 14MG , METFORMIN 1G TWICE DAILY;  Denies personal and family history of Medullary thyroid cancer (MTC) CONTINUE RYBELSUS AND METFORMIN SAMPLES LEFT UP FRONT (RYBELSUS) TOLERATING WELL; DENIES SIDE EFFECTS APPLICATION PENDING FOR NOVO [place] PATIENT ASSISTANCE PROGRAM Current glucose readings: fasting glucose: <150, post prandial glucose: N/A Denies hypoglycemic/hyperglycemic symptoms Discussed meal planning options and Plate method for healthy eating Avoid sugary drinks and desserts Incorporate balanced protein, non starchy veggies, 1 serving of carbohydrate with each meal Increase water intake Increase physical activity as able Current exercise: N/A Counseled on MEDICATIONS--PURPOSE & SIDE EFFECTS Collaborated with PCP--HOLDING ACEi UNTIL REPEAT LABS DUE TO RECENT ILLNESS & AKI Assessed patient finances. APPLICATION PREPARED-->WILL SUBMIT APPLICATION FOR RYBELSUS TO NOVO [place] PATIENT ASSISTANCE PROGRAM ON Thursday WHEN PATIENT BRINGS IN FINANCIALS  Hyperlipidemia:  Goal on Track (progressing): YES. controlled; current treatment: LOVASTATIN;  Medications previously tried: N/A  Current dietary patterns: ENCOURAGED HEART HEALTHY DIET, DECREASE SUGAR AND FATTY FOODS TO HELP WITH TRIGLYCERIDES Counseled on DIET-HEART HEALTHY Lipid Panel     Component Value Date/Time   CHOL 163 02/18/2021 0831   TRIG 181 (H) 02/18/2021 0831   HDL 63 02/18/2021 0831   CHOLHDL 2.6 02/18/2021 0831   LDLCALC 70 02/18/2021 0831   LABVLDL 30 02/18/2021 0831    Patient Goals/Self-Care Activities patient will:  - take medications as prescribed as evidenced by patient report and record review check glucose  DAILY FASTING, document, and provide at future appointments collaborate with provider on medication access solutions target a minimum of 150 minutes of moderate intensity exercise weekly engage in dietary modifications by FOLLOWING HEART HEALTH DIET  Plan: F/U IN 2 MONTHS  Our next appointment is by telephone on 05/21/21 at 8:30AM  Please call the care guide team at [phone] if you need to cancel or reschedule your appointment.   Patient verbalizes understanding of instructions provided today and agrees to view in MyChart.   Signature [phone], PharmD, BCPS Clinical Pharmacist, [facility]  II Phone [phone]

## 2021-03-26 DIAGNOSIS — E7800 Pure hypercholesterolemia, unspecified: Secondary | ICD-10-CM | POA: Diagnosis not present

## 2021-03-26 DIAGNOSIS — Z7984 Long term (current) use of oral hypoglycemic drugs: Secondary | ICD-10-CM | POA: Diagnosis not present

## 2021-03-26 DIAGNOSIS — E1165 Type 2 diabetes mellitus with hyperglycemia: Secondary | ICD-10-CM | POA: Diagnosis not present

## 2021-03-27 ENCOUNTER — Encounter: Payer: Self-pay | Admitting: Family Medicine

## 2021-03-27 ENCOUNTER — Other Ambulatory Visit: Payer: Self-pay

## 2021-03-27 ENCOUNTER — Ambulatory Visit (INDEPENDENT_AMBULATORY_CARE_PROVIDER_SITE_OTHER): Payer: Medicare Other | Admitting: Family Medicine

## 2021-03-27 VITALS — BP 141/83 | HR 89 | Temp 98.3°F | Ht 63.0 in | Wt 167.8 lb

## 2021-03-27 DIAGNOSIS — Z8701 Personal history of pneumonia (recurrent): Secondary | ICD-10-CM | POA: Diagnosis not present

## 2021-03-27 DIAGNOSIS — Z8619 Personal history of other infectious and parasitic diseases: Secondary | ICD-10-CM

## 2021-03-27 DIAGNOSIS — A419 Sepsis, unspecified organism: Secondary | ICD-10-CM | POA: Diagnosis not present

## 2021-03-27 DIAGNOSIS — Z09 Encounter for follow-up examination after completed treatment for conditions other than malignant neoplasm: Secondary | ICD-10-CM | POA: Diagnosis not present

## 2021-03-27 LAB — CMP14+EGFR
ALT: 6 IU/L (ref 0–32)
AST: 8 IU/L (ref 0–40)
Albumin/Globulin Ratio: 2.3 — ABNORMAL HIGH (ref 1.2–2.2)
Albumin: 4.3 g/dL (ref 3.7–4.7)
Alkaline Phosphatase: 75 IU/L (ref 44–121)
BUN/Creatinine Ratio: 8 — ABNORMAL LOW (ref 12–28)
BUN: 8 mg/dL (ref 8–27)
Bilirubin Total: 0.5 mg/dL (ref 0.0–1.2)
CO2: 20 mmol/L (ref 20–29)
Calcium: 9.2 mg/dL (ref 8.7–10.3)
Chloride: 101 mmol/L (ref 96–106)
Creatinine, Ser: 1 mg/dL (ref 0.57–1.00)
Globulin, Total: 1.9 g/dL (ref 1.5–4.5)
Glucose: 134 mg/dL — ABNORMAL HIGH (ref 70–99)
Potassium: 3.9 mmol/L (ref 3.5–5.2)
Sodium: 139 mmol/L (ref 134–144)
Total Protein: 6.2 g/dL (ref 6.0–8.5)
eGFR: 60 mL/min/{1.73_m2} (ref >59)

## 2021-03-27 LAB — CBC WITH DIFFERENTIAL/PLATELET
Basophils Absolute: 0.1 10*3/uL (ref 0.0–0.2)
Basos: 1 %
EOS (ABSOLUTE): 0.2 10*3/uL (ref 0.0–0.4)
Eos: 2 %
Hematocrit: 34.7 % (ref 34.0–46.6)
Hemoglobin: 11.5 g/dL (ref 11.1–15.9)
Immature Grans (Abs): 0 10*3/uL (ref 0.0–0.1)
Immature Granulocytes: 0 %
Lymphocytes Absolute: 1.7 10*3/uL (ref 0.7–3.1)
Lymphs: 17 %
MCH: 27.8 pg (ref 26.6–33.0)
MCHC: 33.1 g/dL (ref 31.5–35.7)
MCV: 84 fL (ref 79–97)
Monocytes Absolute: 0.6 10*3/uL (ref 0.1–0.9)
Monocytes: 6 %
Neutrophils Absolute: 7.3 10*3/uL — ABNORMAL HIGH (ref 1.4–7.0)
Neutrophils: 74 %
Platelets: 442 10*3/uL (ref 150–450)
RBC: 4.13 x10E6/uL (ref 3.77–5.28)
RDW: 13.6 % (ref 11.7–15.4)
WBC: 9.9 10*3/uL (ref 3.4–10.8)

## 2021-03-27 NOTE — Progress Notes (Signed)
Assessment & Plan:  1-3. History of recent pneumonia/history of sepsis/hospital discharge follow-up - improving, still has lingering cough - encouraged to use nebulizer as needed  - will decide if she can restart lisinopril-hctz after labs result - CBC with Differential/Platelet - CMP14+EGFR   Follow up plan: Return as scheduled.  Lucile Crater, NP Student  I personally was present during the history, physical exam, and medical decision-making activities of this service and have verified that the service and findings are accurately documented in the nurse practitioner student's note.  Hendricks Limes, MSN, APRN, FNP-C [facility]   Subjective:   Patient ID: Jeanne Medina, female    DOB: 1950-12-23, 70 y.o.   MRN: [id number]  HPI: Jeanne Medina is a 70 y.o. female presenting on 03/27/2021 for Transitions Of Care (AP 11/22-11/23- PNA)  She was recently hospitalized for sepsis due to pneumonia from 11/22-11/23 after failing treatment outpatient with oral antibiotics due to not taking them due to side effects. She initially went to the ED on 11/21 and was told she was septic with an AKI and left AMA . She returned on 11/22 at the advice of her PCP. She was noted during her hospitalization to have AKI and an elevated lactic acid. She received IV fluids and IV rocephin and azithromycin. They held her lisinopril/HCTZ and metformin due to AKI. She was also noted to have hyponatremia.   Since discharge she has been using a nebulizer as needed and restarted her metformin. She has not restarted her lisinopril-HCTZ as directed by the hospital provider. She is feeling better and only report mild cough and weakness at this time.   ROS: Negative unless specifically indicated above in HPI.   Relevant past medical history reviewed and updated as indicated.   Allergies and medications reviewed and updated.   Current Outpatient Medications:    acetaminophen (TYLENOL) 325  MG tablet, Take 2 tablets (650 mg total) by mouth every 6 (six) hours as needed for mild pain (or Fever >/= 101)., Disp: 12 tablet, Rfl: 0   albuterol (PROVENTIL) (2.5 MG/3ML) 0.083% nebulizer solution, Take 3 mLs (2.5 mg total) by nebulization every 2 (two) hours as needed for wheezing or shortness of breath., Disp: 75 mL, Rfl: 12   albuterol (VENTOLIN HFA) 108 (90 Base) MCG/ACT inhaler, Inhale 2 puffs into the lungs every 4 (four) hours as needed for wheezing or shortness of breath., Disp: 18 g, Rfl: 2   amLODipine (NORVASC) 10 MG tablet, Take 1 tablet (10 mg total) by mouth daily., Disp: 90 tablet, Rfl: 1   citalopram (CELEXA) 40 MG tablet, Take 1 tablet (40 mg total) by mouth daily., Disp: 90 tablet, Rfl: 1   diphenhydrAMINE (BENADRYL) 25 MG tablet, Take 25 mg by mouth at bedtime as needed (for allergies.)., Disp: , Rfl:    guaiFENesin (MUCINEX) 600 MG 12 hr tablet, Take 1 tablet (600 mg total) by mouth 2 (two) times daily., Disp: 20 tablet, Rfl: 0   L-Lysine 500 MG CAPS, Take 1 capsule by mouth daily., Disp: , Rfl:    lovastatin (MEVACOR) 40 MG tablet, Take 1 tablet (40 mg total) by mouth at bedtime., Disp: 90 tablet, Rfl: 1   metFORMIN (GLUCOPHAGE) 500 MG tablet, Take 2 tablets (1,000 mg total) by mouth 2 (two) times daily with a meal., Disp: 60 tablet, Rfl: 3   Semaglutide (RYBELSUS) 14 MG TABS, Take 14 mg by mouth daily., Disp: 90 tablet, Rfl: 3   traZODone (DESYREL) 50 MG tablet,  TAKE 1 TABLET BY MOUTH AT BEDTIME AS NEEDED FOR SLEEP (Patient taking differently: Take 50 mg by mouth at bedtime as needed for sleep.), Disp: 90 tablet, Rfl: 1   lisinopril-hydrochlorothiazide (ZESTORETIC) 20-25 MG tablet, Take 1 tablet by mouth daily. (Patient not taking: Reported on 03/27/2021), Disp: 30 tablet, Rfl: 1  Allergies  Allergen Reactions   Farxiga [Dapagliflozin]     Yeast infections   Sulfa Antibiotics Hives and Itching    welts    Objective:   BP (!) 141/83   Pulse 89   Temp 98.3 F (36.8  C) (Temporal)   Ht '5\' 3"'  (1.6 m)   Wt 76.1 kg   SpO2 95%   BMI 29.72 kg/m    Physical Exam Vitals reviewed.  Constitutional:      General: She is not in acute distress.    Appearance: Normal appearance. She is not ill-appearing, toxic-appearing or diaphoretic.  HENT:     Head: Normocephalic and atraumatic.     Nose: Nose normal.     Mouth/Throat:     Mouth: Mucous membranes are moist.     Pharynx: Oropharynx is clear.  Eyes:     Extraocular Movements: Extraocular movements intact.     Conjunctiva/sclera: Conjunctivae normal.     Pupils: Pupils are equal, round, and reactive to light.  Cardiovascular:     Rate and Rhythm: Normal rate and regular rhythm.     Pulses: Normal pulses.     Heart sounds: Normal heart sounds.  Pulmonary:     Effort: Pulmonary effort is normal. No respiratory distress.     Breath sounds: Normal breath sounds. No wheezing or rhonchi.  Chest:     Chest wall: No tenderness.  Abdominal:     General: There is no distension.     Palpations: Abdomen is soft. There is no mass.  Musculoskeletal:        General: Normal range of motion.     Cervical back: Normal range of motion.  Skin:    General: Skin is warm and dry.     Capillary Refill: Capillary refill takes less than 2 seconds.  Neurological:     General: No focal deficit present.     Mental Status: She is alert and oriented to person, place, and time.     Motor: No weakness.     Gait: Gait normal.  Psychiatric:        Mood and Affect: Mood normal.        Behavior: Behavior normal.        Thought Content: Thought content normal.        Judgment: Judgment normal.

## 2021-04-01 DIAGNOSIS — Z1211 Encounter for screening for malignant neoplasm of colon: Secondary | ICD-10-CM | POA: Diagnosis not present

## 2021-04-06 LAB — COLOGUARD: COLOGUARD: NEGATIVE

## 2021-04-11 ENCOUNTER — Encounter: Payer: Self-pay | Admitting: Family Medicine

## 2021-04-17 ENCOUNTER — Encounter: Payer: Self-pay | Admitting: Family Medicine

## 2021-04-18 ENCOUNTER — Ambulatory Visit (INDEPENDENT_AMBULATORY_CARE_PROVIDER_SITE_OTHER): Payer: Medicare Other | Admitting: Nurse Practitioner

## 2021-04-18 ENCOUNTER — Encounter: Payer: Self-pay | Admitting: Nurse Practitioner

## 2021-04-18 DIAGNOSIS — R051 Acute cough: Secondary | ICD-10-CM

## 2021-04-18 MED ORDER — BENZONATATE 100 MG PO CAPS: 100.0000 mg | ORAL_CAPSULE | Freq: Three times a day (TID) | ORAL | 0 refills | Status: DC | PRN

## 2021-04-18 NOTE — Progress Notes (Signed)
° °  Virtual Visit  Note Due to COVID-19 pandemic this visit was conducted virtually. This visit type was conducted due to national recommendations for restrictions regarding the COVID-19 Pandemic (e.g. social distancing, sheltering in place) in an effort to limit this patient's exposure and mitigate transmission in our community. All issues noted in this document were discussed and addressed.  A physical exam was not performed with this format.  I connected with Jeanne Medina on 04/18/21 at 9:32 by telephone and verified that I am speaking with the correct person using two identifiers. Jeanne Medina is currently located at home and no one is currently with her during visit. The provider, Mary-Margaret Daphine Deutscher, FNP is located in their office at time of visit.  I discussed the limitations, risks, security and privacy concerns of performing an evaluation and management service by telephone and the availability of in person appointments. I also discussed with the patient that there may be a patient responsible charge related to this service. The patient expressed understanding and agreed to proceed.   History and Present Illness:  HPI [name] states that she had pneumonia several weeks ago. She got better. Several days afo she became congested with cough. Cough keeps her up at night. She would like some tessalon perles.   Review of Systems  Constitutional:  Negative for chills, fever and malaise/fatigue.  HENT:  Positive for sore throat (slight). Negative for congestion.   Respiratory:  Positive for cough. Negative for sputum production and shortness of breath.   Musculoskeletal:  Negative for myalgias.  Neurological:  Negative for dizziness and headaches.    Observations/Objective: Alert and oriented- answers all questions appropriately No distress Raspy voice Slight dry cough noted  Assessment and Plan: Jeanne Medina in today with chief complaint of No chief complaint on file.   1.  Acute cough Force fluids Humidifier Meds ordered this encounter  Medications   benzonatate (TESSALON PERLES) 100 MG capsule    Sig: Take 1 capsule (100 mg total) by mouth 3 (three) times daily as needed.    Dispense:  20 capsule    Refill:  0    Order Specific Question:   Supervising Provider    Answer:   Arville Care A [[id number]]      Follow Up Instructions: prn    I discussed the assessment and treatment plan with the patient. The patient was provided an opportunity to ask questions and all were answered. The patient agreed with the plan and demonstrated an understanding of the instructions.   The patient was advised to call back or seek an in-person evaluation if the symptoms worsen or if the condition fails to improve as anticipated.  The above assessment and management plan was discussed with the patient. The patient verbalized understanding of and has agreed to the management plan. Patient is aware to call the clinic if symptoms persist or worsen. Patient is aware when to return to the clinic for a follow-up visit. Patient educated on when it is appropriate to go to the emergency department.   Time call ended:  9:45  I provided 13 minutes of  non face-to-face time during this encounter.    Mary-Margaret Daphine Deutscher, FNP

## 2021-04-23 ENCOUNTER — Encounter: Payer: Self-pay | Admitting: Family Medicine

## 2021-04-26 ENCOUNTER — Other Ambulatory Visit: Payer: Self-pay | Admitting: Family Medicine

## 2021-04-26 DIAGNOSIS — J0110 Acute frontal sinusitis, unspecified: Secondary | ICD-10-CM

## 2021-05-07 ENCOUNTER — Encounter: Payer: Self-pay | Admitting: Family Medicine

## 2021-05-07 DIAGNOSIS — E1165 Type 2 diabetes mellitus with hyperglycemia: Secondary | ICD-10-CM

## 2021-05-08 MED ORDER — RYBELSUS 14 MG PO TABS: 14.0000 mg | ORAL_TABLET | Freq: Every day | ORAL | 0 refills | Status: DC

## 2021-05-17 DIAGNOSIS — H40033 Anatomical narrow angle, bilateral: Secondary | ICD-10-CM | POA: Diagnosis not present

## 2021-05-17 DIAGNOSIS — H5203 Hypermetropia, bilateral: Secondary | ICD-10-CM | POA: Diagnosis not present

## 2021-05-17 DIAGNOSIS — E119 Type 2 diabetes mellitus without complications: Secondary | ICD-10-CM | POA: Diagnosis not present

## 2021-05-19 ENCOUNTER — Telehealth: Payer: Self-pay | Admitting: Family Medicine

## 2021-05-19 NOTE — Telephone Encounter (Signed)
Patient can not she both on same day due to insurance. Canceled appt with Almyra Free - called patient and left message to call the office back to r/s appt.

## 2021-05-21 ENCOUNTER — Telehealth: Payer: Medicare Other

## 2021-05-21 ENCOUNTER — Ambulatory Visit (INDEPENDENT_AMBULATORY_CARE_PROVIDER_SITE_OTHER): Payer: Medicare Other | Admitting: Family Medicine

## 2021-05-21 ENCOUNTER — Encounter: Payer: Self-pay | Admitting: Family Medicine

## 2021-05-21 VITALS — BP 152/75 | HR 93 | Temp 97.9°F | Ht 63.0 in | Wt 163.4 lb

## 2021-05-21 DIAGNOSIS — F419 Anxiety disorder, unspecified: Secondary | ICD-10-CM

## 2021-05-21 DIAGNOSIS — E7800 Pure hypercholesterolemia, unspecified: Secondary | ICD-10-CM | POA: Diagnosis not present

## 2021-05-21 DIAGNOSIS — G479 Sleep disorder, unspecified: Secondary | ICD-10-CM

## 2021-05-21 DIAGNOSIS — E1165 Type 2 diabetes mellitus with hyperglycemia: Secondary | ICD-10-CM | POA: Diagnosis not present

## 2021-05-21 DIAGNOSIS — I10 Essential (primary) hypertension: Secondary | ICD-10-CM

## 2021-05-21 MED ORDER — RYBELSUS 14 MG PO TABS: 14.0000 mg | ORAL_TABLET | Freq: Every day | ORAL | 1 refills | Status: DC

## 2021-05-21 MED ORDER — LOVASTATIN 40 MG PO TABS: 40.0000 mg | ORAL_TABLET | Freq: Every day | ORAL | 1 refills | Status: DC

## 2021-05-21 MED ORDER — CITALOPRAM HYDROBROMIDE 40 MG PO TABS: 40.0000 mg | ORAL_TABLET | Freq: Every day | ORAL | 1 refills | Status: DC

## 2021-05-21 MED ORDER — TRAZODONE HCL 50 MG PO TABS: 50.0000 mg | ORAL_TABLET | Freq: Every evening | ORAL | 1 refills | Status: DC | PRN

## 2021-05-21 NOTE — Patient Instructions (Signed)
Please start monitoring your blood pressure at home. If consistently >140/90 please let me know so we can adjust your medication.

## 2021-05-21 NOTE — Progress Notes (Signed)
Assessment & Plan:  1. Type 2 diabetes mellitus with hyperglycemia, without long-term current use of insulin (HCC) Lab Results  Component Value Date   HGBA1C 7.2 (H) 03/18/2021   HGBA1C 7.0 (H) 02/18/2021   HGBA1C 7.9 (H) 11/14/2020    - Diabetes is not at goal of A1c < 7. - Medications: continue current medications now that she has appropriate dosing of medications - Home glucose monitoring: yes - Patient is currently taking a statin. Patient is taking an ACE-inhibitor/ARB.  - Instruction/counseling given: discussed diet  Diabetes Health Maintenance Due  Topic Date Due   OPHTHALMOLOGY EXAM  07/30/2021   HEMOGLOBIN A1C  09/15/2021   FOOT EXAM  02/18/2022    Lab Results  Component Value Date   LABMICR 21.5 02/18/2021   LABMICR 12.0 01/02/2020   - lovastatin (MEVACOR) 40 MG tablet; Take 1 tablet (40 mg total) by mouth at bedtime.  Dispense: 90 tablet; Refill: 1 - Semaglutide (RYBELSUS) 14 MG TABS; Take 14 mg by mouth daily.  Dispense: 90 tablet; Refill: 1  2. Hypercholesteremia - continue lovastatin - lovastatin (MEVACOR) 40 MG tablet; Take 1 tablet (40 mg total) by mouth at bedtime.  Dispense: 90 tablet; Refill: 1  3. Essential hypertension - encouraged to take BP at home and keep a log  - continue antihypertensive agents - education provided on the DASH diet  4. Anxiety Well controlled on current regimen - citalopram (CELEXA) 40 MG tablet; Take 1 tablet (40 mg total) by mouth daily.  Dispense: 90 tablet; Refill: 1  5. Difficulty sleeping Well controlled on current regimen - traZODone (DESYREL) 50 MG tablet; Take 1 tablet (50 mg total) by mouth at bedtime as needed for sleep.  Dispense: 90 tablet; Refill: 1    Return in about 3 months (around 08/19/2021) for follow-up of chronic medication conditions.  Lucile Crater, NP Student  I personally was present during the history, physical exam, and medical decision-making activities of this service and have verified that  the service and findings are accurately documented in the nurse practitioner student's note.  Hendricks Limes, MSN, APRN, FNP-C [facility]  Subjective:    Patient ID: Jeanne Medina, female    DOB: 04-25-1951, 71 y.o.   MRN: [id number]  Patient Care Team: Loman Brooklyn, FNP as PCP - General (Family Medicine) Lavera Guise, [facility] (Pharmacist) Harlen Labs, MD as Referring Physician (Optometry)   Chief Complaint:  Chief Complaint  Patient presents with   Medical Management of Chronic Issues    HPI: Jeanne Medina is a 71 y.o. female presenting on 05/21/2021 for Medical Management of Chronic Issues  Diabetes: Patient presents for follow up of diabetes. Current symptoms include: none.  Fasting blood sugar this morning was 134, which she says in normal for her.  Known diabetic complications: none. Medication compliance: yes. Current diet: in general, a "healthy" diet  . Current exercise:  30 minutes of cardio with 30 minutes of weight training 3x/week .  Is she  on ACE inhibitor or angiotensin II receptor blocker? Yes (Lisinopril). Is she on a statin? Yes (Lovastatin).  She had not been taking her Rybelsus consistently due to supply issues from the health department, so she has been taking split doses to make her supply last. She has been taking the full dose for at least a week since she got her prescription filled at the pharmacy.   Hypertension: She is elevated today. Previous readings fluctuate between 120s-140s in office while  on lisinopril-HCTZ and amlodipine. She has not been taking her blood pressures at home since her pneumonia.   Hypercholesteremia: taking lovastatin 40 mg daily. The 10-year ASCVD risk score (Arnett DK, et al., 2019) is: 31.8%   Values used to calculate the score:     Age: 50 years     Sex: Female     Is Non-Hispanic African American: No     Diabetic: Yes     Tobacco smoker: No     Systolic Blood Pressure: 518 mmHg     Is BP  treated: Yes     HDL Cholesterol: 63 mg/dL     Total Cholesterol: 163 mg/dL  Anxiety/Insomnia: controlled with Celexa and Trazodone.  Depression screen [facility] 2/9 05/21/2021 03/27/2021 02/18/2021  Decreased Interest 0 0 0  Down, Depressed, Hopeless 0 0 0  PHQ - 2 Score 0 0 0  Altered sleeping 0 1 0  Tired, decreased energy 0 1 0  Change in appetite 0 0 0  Feeling bad or failure about yourself  0 0 0  Trouble concentrating 0 0 0  Moving slowly or fidgety/restless 0 0 0  Suicidal thoughts 0 0 0  PHQ-9 Score 0 2 0  Difficult doing work/chores Not difficult at all Not difficult at all -  Some recent data might be hidden   GAD 7 : Generalized Anxiety Score 05/21/2021 03/27/2021 02/18/2021 11/14/2020  Nervous, Anxious, on Edge 0 0 0 0  Control/stop worrying 0 0 0 0  Worry too much - different things 0 0 0 0  Trouble relaxing 0 0 0 0  Restless 0 0 0 0  Easily annoyed or irritable 0 0 0 0  Afraid - awful might happen 0 0 0 0  Total GAD 7 Score 0 0 0 0  Anxiety Difficulty Not difficult at all Not difficult at all - Not difficult at all    New complaints: None  Social history:  Relevant past medical, surgical, family and social history reviewed and updated as indicated. Interim medical history since our last visit reviewed.  Allergies and medications reviewed and updated.  DATA REVIEWED: CHART IN EPIC  ROS: Negative unless specifically indicated above in HPI.    Current Outpatient Medications:    acetaminophen (TYLENOL) 325 MG tablet, Take 2 tablets (650 mg total) by mouth every 6 (six) hours as needed for mild pain (or Fever >/= 101)., Disp: 12 tablet, Rfl: 0   albuterol (PROVENTIL) (2.5 MG/3ML) 0.083% nebulizer solution, Take 3 mLs (2.5 mg total) by nebulization every 2 (two) hours as needed for wheezing or shortness of breath., Disp: 75 mL, Rfl: 12   albuterol (VENTOLIN HFA) 108 (90 Base) MCG/ACT inhaler, Inhale 2 puffs into the lungs every 4 (four) hours as needed for wheezing or  shortness of breath., Disp: 18 g, Rfl: 2   amLODipine (NORVASC) 10 MG tablet, Take 1 tablet (10 mg total) by mouth daily., Disp: 90 tablet, Rfl: 1   diphenhydrAMINE (BENADRYL) 25 MG tablet, Take 25 mg by mouth at bedtime as needed (for allergies.)., Disp: , Rfl:    fluticasone (FLONASE) 50 MCG/ACT nasal spray, Use 1 spray(s) in each nostril twice daily, Disp: 48 g, Rfl: 0   L-Lysine 500 MG CAPS, Take 1 capsule by mouth daily., Disp: , Rfl:    lisinopril-hydrochlorothiazide (ZESTORETIC) 20-25 MG tablet, Take 1 tablet by mouth daily., Disp: 30 tablet, Rfl: 1   metFORMIN (GLUCOPHAGE) 500 MG tablet, Take 2 tablets (1,000 mg total) by mouth 2 (two) times  daily with a meal., Disp: 60 tablet, Rfl: 3   citalopram (CELEXA) 40 MG tablet, Take 1 tablet (40 mg total) by mouth daily., Disp: 90 tablet, Rfl: 1   guaiFENesin (MUCINEX) 600 MG 12 hr tablet, Take 1 tablet (600 mg total) by mouth 2 (two) times daily. (Patient not taking: Reported on 05/21/2021), Disp: 20 tablet, Rfl: 0   lovastatin (MEVACOR) 40 MG tablet, Take 1 tablet (40 mg total) by mouth at bedtime., Disp: 90 tablet, Rfl: 1   Semaglutide (RYBELSUS) 14 MG TABS, Take 14 mg by mouth daily., Disp: 90 tablet, Rfl: 1   traZODone (DESYREL) 50 MG tablet, Take 1 tablet (50 mg total) by mouth at bedtime as needed for sleep., Disp: 90 tablet, Rfl: 1   Allergies  Allergen Reactions   Farxiga [Dapagliflozin]     Yeast infections   Sulfa Antibiotics Hives and Itching    welts   Past Medical History:  Diagnosis Date   Anxiety    Arthritis    osteoarthritis   Asthma    Diabetes mellitus without complication ([place])    Hypercholesteremia    Hypertension     Past Surgical History:  Procedure Laterality Date   CATARACT EXTRACTION W/PHACO Left 08/18/2016   Procedure: CATARACT EXTRACTION PHACO AND INTRAOCULAR LENS PLACEMENT ([place]);  Surgeon: Rutherford Guys, MD;  Location: [facility];  Service: Ophthalmology;  Laterality: Left;  CDE: 6.05   CATARACT EXTRACTION  W/PHACO Left 09/01/2016   Procedure: REPOSITIONING OF LEFT IOL LENS;  Surgeon: Rutherford Guys, MD;  Location: [facility];  Service: Ophthalmology;  Laterality: Left;   CATARACT EXTRACTION W/PHACO Right 10/06/2016   Procedure: CATARACT EXTRACTION PHACO AND INTRAOCULAR LENS PLACEMENT ([facility]);  Surgeon: Rutherford Guys, MD;  Location: [facility];  Service: Ophthalmology;  Laterality: Right;  CDE: 6.21    Social History   Socioeconomic History   Marital status: Married    Spouse name: Sonia Side   Number of children: 0   Years of education: Not on file   Highest education level: Not on file  Occupational History   Not on file  Tobacco Use   Smoking status: Former    Packs/day: 0.25    Years: 20.00    Pack years: 5.00    Types: Cigarettes    Quit date: 08/13/1980    Years since quitting: 40.7   Smokeless tobacco: Never  Substance and Sexual Activity   Alcohol use: No   Drug use: No   Sexual activity: Yes    Birth control/protection: Post-menopausal  Other Topics Concern   Not on file  Social History Narrative   Lives home with her husband. Works out at [employer] 60 min 3x per week and walks 30 minutes other days.   Social Determinants of Health   Financial Resource Strain: Low Risk    Difficulty of Paying Living Expenses: Not hard at all  Food Insecurity: No Food Insecurity   Worried About Charity fundraiser in the Last Year: Never true   [place] in the Last Year: Never true  Transportation Needs: Unknown   Lack of Transportation (Medical): No   Lack of Transportation (Non-Medical): Not on file  Physical Activity: Sufficiently Active   Days of Exercise per Week: 7 days   Minutes of Exercise per Session: 60 min  Stress: No Stress Concern Present   Feeling of Stress : Not at all  Social Connections: Socially Integrated   Frequency of Communication with Friends and Family: More than three times a week  Frequency of Social Gatherings with Friends and Family: More than three times a week    Attends Religious Services: More than 4 times per year   Active Member of Clubs or Organizations: Yes   Attends Music therapist: More than 4 times per year   Marital Status: Married  Human resources officer Violence: Not At Risk   Fear of Current or Ex-Partner: No   Emotionally Abused: No   Physically Abused: No   Sexually Abused: No        Objective:    BP (!) 152/75    Pulse 93    Temp 97.9 F (36.6 C) (Temporal)    Ht '5\' 3"'  (1.6 m)    Wt 74.1 kg    SpO2 96%    BMI 28.95 kg/m   Wt Readings from Last 3 Encounters:  05/21/21 163 lb 6.4 oz (74.1 kg)  03/27/21 167 lb 12.8 oz (76.1 kg)  03/18/21 170 lb (77.1 kg)    Physical Exam Vitals reviewed.  Constitutional:      General: She is not in acute distress.    Appearance: Normal appearance. She is overweight. She is not ill-appearing, toxic-appearing or diaphoretic.  HENT:     Head: Normocephalic and atraumatic.  Eyes:     General: No scleral icterus.       Right eye: No discharge.        Left eye: No discharge.     Conjunctiva/sclera: Conjunctivae normal.  Cardiovascular:     Rate and Rhythm: Normal rate and regular rhythm.     Heart sounds: Normal heart sounds. No murmur heard.   No friction rub. No gallop.  Pulmonary:     Effort: Pulmonary effort is normal. No respiratory distress.     Breath sounds: Normal breath sounds. No stridor. No wheezing, rhonchi or rales.  Musculoskeletal:        General: Normal range of motion.     Cervical back: Normal range of motion.  Skin:    General: Skin is warm and dry.     Capillary Refill: Capillary refill takes less than 2 seconds.  Neurological:     General: No focal deficit present.     Mental Status: She is alert and oriented to person, place, and time. Mental status is at baseline.  Psychiatric:        Mood and Affect: Mood normal.        Behavior: Behavior normal.        Thought Content: Thought content normal.        Judgment: Judgment normal.    No results  found for: TSH Lab Results  Component Value Date   WBC 9.9 03/27/2021   HGB 11.5 03/27/2021   HCT 34.7 03/27/2021   MCV 84 03/27/2021   PLT 442 03/27/2021   Lab Results  Component Value Date   NA 139 03/27/2021   K 3.9 03/27/2021   CO2 20 03/27/2021   GLUCOSE 134 (H) 03/27/2021   BUN 8 03/27/2021   CREATININE 1.00 03/27/2021   BILITOT 0.5 03/27/2021   ALKPHOS 75 03/27/2021   AST 8 03/27/2021   ALT 6 03/27/2021   PROT 6.2 03/27/2021   ALBUMIN 4.3 03/27/2021   CALCIUM 9.2 03/27/2021   ANIONGAP 10 03/19/2021   EGFR 60 03/27/2021   Lab Results  Component Value Date   CHOL 163 02/18/2021   Lab Results  Component Value Date   HDL 63 02/18/2021   Lab Results  Component Value Date  [place] 70 02/18/2021   Lab Results  Component Value Date   TRIG 181 (H) 02/18/2021   Lab Results  Component Value Date   CHOLHDL 2.6 02/18/2021   Lab Results  Component Value Date   HGBA1C 7.2 (H) 03/18/2021

## 2021-06-12 ENCOUNTER — Encounter: Payer: Self-pay | Admitting: Family Medicine

## 2021-06-17 ENCOUNTER — Telehealth: Payer: Self-pay | Admitting: *Deleted

## 2021-06-17 NOTE — Telephone Encounter (Signed)
#  4 bottles of qty 30 - RYBELSYS 14 mg came in today - pt assistance Pt aware here and will pick up - up front with name and dob on box.

## 2021-08-13 ENCOUNTER — Telehealth: Payer: Self-pay | Admitting: *Deleted

## 2021-08-13 NOTE — Telephone Encounter (Signed)
Pt assistance - Rybelsus 14 mg  #4 boxes of #30 here for pt  ? ?Pt called and aware to come pick up - at front with name and dob on the box  ?

## 2021-08-19 ENCOUNTER — Ambulatory Visit (INDEPENDENT_AMBULATORY_CARE_PROVIDER_SITE_OTHER): Payer: Medicare Other | Admitting: Family Medicine

## 2021-08-19 ENCOUNTER — Encounter: Payer: Self-pay | Admitting: Family Medicine

## 2021-08-19 VITALS — BP 116/62 | HR 91 | Temp 95.4°F | Ht 63.0 in | Wt 157.0 lb

## 2021-08-19 DIAGNOSIS — E7800 Pure hypercholesterolemia, unspecified: Secondary | ICD-10-CM

## 2021-08-19 DIAGNOSIS — F419 Anxiety disorder, unspecified: Secondary | ICD-10-CM

## 2021-08-19 DIAGNOSIS — E1165 Type 2 diabetes mellitus with hyperglycemia: Secondary | ICD-10-CM | POA: Diagnosis not present

## 2021-08-19 DIAGNOSIS — I10 Essential (primary) hypertension: Secondary | ICD-10-CM

## 2021-08-19 DIAGNOSIS — G479 Sleep disorder, unspecified: Secondary | ICD-10-CM

## 2021-08-19 LAB — BAYER DCA HB A1C WAIVED: HB A1C (BAYER DCA - WAIVED): 5.8 % — ABNORMAL HIGH (ref 4.8–5.6)

## 2021-08-19 MED ORDER — METFORMIN HCL 500 MG PO TABS: 1000.0000 mg | ORAL_TABLET | Freq: Two times a day (BID) | ORAL | 1 refills | Status: DC

## 2021-08-19 NOTE — Progress Notes (Signed)
? ?Assessment & Plan:  ?1. Type 2 diabetes mellitus with hyperglycemia, without long-term current use of insulin ([place]) ?Lab Results  ?Component Value Date  ? HGBA1C 5.8 (H) 08/19/2021  ? HGBA1C 7.2 (H) 03/18/2021  ? HGBA1C 7.0 (H) 02/18/2021  ?  ?- Diabetes is at goal of A1c < 7. ?- Medications: continue current medications ?- Home glucose monitoring: continue monitoring. ?- Patient is currently taking a statin. Patient is taking an ACE-inhibitor/ARB.  ? ?Diabetes Health Maintenance Due  ?Topic Date Due  ? OPHTHALMOLOGY EXAM  07/30/2021  ? FOOT EXAM  02/18/2022  ? HEMOGLOBIN A1C  02/18/2022  ?  ?Lab Results  ?Component Value Date  ? LABMICR 21.5 02/18/2021  ? LABMICR 12.0 01/02/2020  ? ?- Lipid panel ?- CBC with Differential/Platelet ?- CMP14+EGFR ?- Bayer DCA Hb A1c Waived ?- Vitamin B12 ?- metFORMIN (GLUCOPHAGE) 500 MG tablet; Take 2 tablets (1,000 mg total) by mouth 2 (two) times daily with a meal.  Dispense: 360 tablet; Refill: 1 ? ?2. Essential hypertension ?Well controlled on current regimen.  ?- Lipid panel ?- CBC with Differential/Platelet ?- CMP14+EGFR ? ?3. Hypercholesteremia ?Well controlled on current regimen.  ?- Lipid panel ?- CBC with Differential/Platelet ?- CMP14+EGFR ? ?4. Anxiety ?Well controlled on current regimen.  ?- CMP14+EGFR ? ?5. Difficulty sleeping ?Well controlled on current regimen.  ?- CMP14+EGFR ? ? ?Return in about 4 months (around 12/19/2021) for annual physical. ? ?Jeanne Limes, MSN, APRN, FNP-C ?[place] ? ?Subjective:  ? ? Patient ID: Jeanne Medina, female    DOB: [birth year], 71 y.o.   MRN: [id number] ? ?Patient Care Team: ?Jeanne Brooklyn, FNP as PCP - General (Family Medicine) ?Jeanne Medina, [facility] (Pharmacist) ?Jeanne Labs, MD as Referring Physician (Optometry)  ? ?Chief Complaint:  ?Chief Complaint  ?Patient presents with  ? Medical Management of Chronic Issues  ? ? ?HPI: ?Jeanne Medina is a 71 y.o. female presenting on 08/19/2021 for Medical  Management of Chronic Issues ? ?Diabetes: Patient presents for follow up of diabetes. Current symptoms include: none.  Fasting blood sugar this morning was 136, which she says in normal for her.  Known diabetic complications: none. Medication compliance: yes. Current diet: in general, a "healthy" diet  . Current exercise:  30 minutes of cardio with 30 minutes of weight training 3x/week .  Is she  on ACE inhibitor or angiotensin II receptor blocker? Yes (Lisinopril). Is she on a statin? Yes (Lovastatin).   ? ?Hypertension: She is elevated today. Previous readings fluctuate between 120s-140s in office while on lisinopril-HCTZ and amlodipine. She has been checking her blood pressure at home and reports 110-120s/60-70s. ? ?Hypercholesteremia: taking lovastatin 40 mg daily. ? ?The 10-year ASCVD risk score (Arnett DK, et al., 2019) is: 19.9% ?  Values used to calculate the score: ?    Age: 78 years ?    Sex: Female ?    Is Non-Hispanic African American: No ?    Diabetic: Yes ?    Tobacco smoker: No ?    Systolic Blood Pressure: 696 mmHg ?    Is BP treated: Yes ?    HDL Cholesterol: 63 mg/dL ?    Total Cholesterol: 163 mg/dL ? ?Anxiety/Insomnia: controlled with Celexa and Trazodone.  ? ? ?  08/19/2021  ?  8:04 AM 05/21/2021  ?  7:59 AM 03/27/2021  ? 10:19 AM  ?Depression screen PHQ 2/9  ?Decreased Interest 0 0 0  ?Down, Depressed, Hopeless 0 0 0  ?  PHQ - 2 Score 0 0 0  ?Altered sleeping 1 0 1  ?Tired, decreased energy 0 0 1  ?Change in appetite 0 0 0  ?Feeling bad or failure about yourself  0 0 0  ?Trouble concentrating 0 0 0  ?Moving slowly or fidgety/restless 0 0 0  ?Suicidal thoughts 0 0 0  ?PHQ-9 Score 1 0 2  ?Difficult doing work/chores Not difficult at all Not difficult at all Not difficult at all  ? ? ?  08/19/2021  ?  8:04 AM 05/21/2021  ?  7:59 AM 03/27/2021  ? 10:19 AM 02/18/2021  ?  8:12 AM  ?GAD 7 : Generalized Anxiety Score  ?Nervous, Anxious, on Edge 0 0 0 0  ?Control/stop worrying 0 0 0 0  ?Worry too much -  different things 0 0 0 0  ?Trouble relaxing 0 0 0 0  ?Restless 0 0 0 0  ?Easily annoyed or irritable 0 0 0 0  ?Afraid - awful might happen 0 0 0 0  ?Total GAD 7 Score 0 0 0 0  ?Anxiety Difficulty Not difficult at all Not difficult at all Not difficult at all   ? ? ?New complaints: ?None ? ? ?Social history: ? ?Relevant past medical, surgical, family and social history reviewed and updated as indicated. Interim medical history since our last visit reviewed. ? ?Allergies and medications reviewed and updated. ? ?DATA REVIEWED: CHART IN EPIC ? ?ROS: Negative unless specifically indicated above in HPI.  ? ? ?Current Outpatient Medications:  ?  acetaminophen (TYLENOL) 325 MG tablet, Take 2 tablets (650 mg total) by mouth every 6 (six) hours as needed for mild pain (or Fever >/= 101)., Disp: 12 tablet, Rfl: 0 ?  albuterol (PROVENTIL) (2.5 MG/3ML) 0.083% nebulizer solution, Take 3 mLs (2.5 mg total) by nebulization every 2 (two) hours as needed for wheezing or shortness of breath., Disp: 75 mL, Rfl: 12 ?  albuterol (VENTOLIN HFA) 108 (90 Base) MCG/ACT inhaler, Inhale 2 puffs into the lungs every 4 (four) hours as needed for wheezing or shortness of breath., Disp: 18 g, Rfl: 2 ?  amLODipine (NORVASC) 10 MG tablet, Take 1 tablet (10 mg total) by mouth daily., Disp: 90 tablet, Rfl: 1 ?  citalopram (CELEXA) 40 MG tablet, Take 1 tablet (40 mg total) by mouth daily., Disp: 90 tablet, Rfl: 1 ?  diphenhydrAMINE (BENADRYL) 25 MG tablet, Take 25 mg by mouth at bedtime as needed (for allergies.)., Disp: , Rfl:  ?  fluticasone (FLONASE) 50 MCG/ACT nasal spray, Use 1 spray(s) in each nostril twice daily, Disp: 48 g, Rfl: 0 ?  guaiFENesin (MUCINEX) 600 MG 12 hr tablet, Take 1 tablet (600 mg total) by mouth 2 (two) times daily., Disp: 20 tablet, Rfl: 0 ?  L-Lysine 500 MG CAPS, Take 1 capsule by mouth daily., Disp: , Rfl:  ?  lisinopril-hydrochlorothiazide (ZESTORETIC) 20-25 MG tablet, Take 1 tablet by mouth daily., Disp: 30 tablet, Rfl:  1 ?  lovastatin (MEVACOR) 40 MG tablet, Take 1 tablet (40 mg total) by mouth at bedtime., Disp: 90 tablet, Rfl: 1 ?  metFORMIN (GLUCOPHAGE) 500 MG tablet, Take 2 tablets (1,000 mg total) by mouth 2 (two) times daily with a meal., Disp: 60 tablet, Rfl: 3 ?  Semaglutide (RYBELSUS) 14 MG TABS, Take 14 mg by mouth daily., Disp: 90 tablet, Rfl: 1 ?  traZODone (DESYREL) 50 MG tablet, Take 1 tablet (50 mg total) by mouth at bedtime as needed for sleep., Disp: 90 tablet, Rfl: 1  ? ?  Allergies  ?Allergen Reactions  ? Wilder Glade [Dapagliflozin]   ?  Yeast infections  ? Sulfa Antibiotics Hives and Itching  ?  welts  ? ?Past Medical History:  ?Diagnosis Date  ? Anxiety   ? Arthritis   ? osteoarthritis  ? Asthma   ? Diabetes mellitus without complication ([place])   ? Hypercholesteremia   ? Hypertension   ?  ?Past Surgical History:  ?Procedure Laterality Date  ? CATARACT EXTRACTION W/PHACO Left 08/18/2016  ? Procedure: CATARACT EXTRACTION PHACO AND INTRAOCULAR LENS PLACEMENT ([facility]);  Surgeon: Rutherford Guys, MD;  Location: [facility];  Service: Ophthalmology;  Laterality: Left;  CDE: 6.05  ? CATARACT EXTRACTION W/PHACO Left 09/01/2016  ? Procedure: REPOSITIONING OF LEFT IOL LENS;  Surgeon: Rutherford Guys, MD;  Location: [facility];  Service: Ophthalmology;  Laterality: Left;  ? CATARACT EXTRACTION W/PHACO Right 10/06/2016  ? Procedure: CATARACT EXTRACTION PHACO AND INTRAOCULAR LENS PLACEMENT ([facility]);  Surgeon: Rutherford Guys, MD;  Location: [facility];  Service: Ophthalmology;  Laterality: Right;  CDE: 6.21  ?  ?Social History  ? ?Socioeconomic History  ? Marital status: Married  ?  Spouse name: Sonia Side  ? Number of children: 0  ? Years of education: Not on file  ? Highest education level: Not on file  ?Occupational History  ? Not on file  ?Tobacco Use  ? Smoking status: Former  ?  Packs/day: 0.25  ?  Years: 20.00  ?  Pack years: 5.00  ?  Types: Cigarettes  ?  Quit date: 08/13/1980  ?  Years since quitting: 41.0  ? Smokeless tobacco: Never  ?Substance and Sexual  Activity  ? Alcohol use: No  ? Drug use: No  ? Sexual activity: Yes  ?  Birth control/protection: Post-menopausal  ?Other Topics Concern  ? Not on file  ?Social History Narrative  ? Lives home with her husban

## 2021-08-20 LAB — CMP14+EGFR
ALT: 6 IU/L (ref 0–32)
AST: 10 IU/L (ref 0–40)
Albumin/Globulin Ratio: 1.9 (ref 1.2–2.2)
Albumin: 4.2 g/dL (ref 3.7–4.7)
Alkaline Phosphatase: 73 IU/L (ref 44–121)
BUN/Creatinine Ratio: 10 — ABNORMAL LOW (ref 12–28)
BUN: 13 mg/dL (ref 8–27)
Bilirubin Total: 0.4 mg/dL (ref 0.0–1.2)
CO2: 25 mmol/L (ref 20–29)
Calcium: 9.6 mg/dL (ref 8.7–10.3)
Chloride: 98 mmol/L (ref 96–106)
Creatinine, Ser: 1.24 mg/dL — ABNORMAL HIGH (ref 0.57–1.00)
Globulin, Total: 2.2 g/dL (ref 1.5–4.5)
Glucose: 154 mg/dL — ABNORMAL HIGH (ref 70–99)
Potassium: 4.3 mmol/L (ref 3.5–5.2)
Sodium: 140 mmol/L (ref 134–144)
Total Protein: 6.4 g/dL (ref 6.0–8.5)
eGFR: 47 mL/min/{1.73_m2} — ABNORMAL LOW (ref >59)

## 2021-08-20 LAB — CBC WITH DIFFERENTIAL/PLATELET
Basophils Absolute: 0.1 10*3/uL (ref 0.0–0.2)
Basos: 1 %
EOS (ABSOLUTE): 0.3 10*3/uL (ref 0.0–0.4)
Eos: 3 %
Hematocrit: 36.3 % (ref 34.0–46.6)
Hemoglobin: 11.7 g/dL (ref 11.1–15.9)
Immature Grans (Abs): 0 10*3/uL (ref 0.0–0.1)
Immature Granulocytes: 0 %
Lymphocytes Absolute: 1.8 10*3/uL (ref 0.7–3.1)
Lymphs: 23 %
MCH: 25.9 pg — ABNORMAL LOW (ref 26.6–33.0)
MCHC: 32.2 g/dL (ref 31.5–35.7)
MCV: 81 fL (ref 79–97)
Monocytes Absolute: 0.7 10*3/uL (ref 0.1–0.9)
Monocytes: 8 %
Neutrophils Absolute: 5.1 10*3/uL (ref 1.4–7.0)
Neutrophils: 65 %
Platelets: 403 10*3/uL (ref 150–450)
RBC: 4.51 x10E6/uL (ref 3.77–5.28)
RDW: 13.8 % (ref 11.7–15.4)
WBC: 7.9 10*3/uL (ref 3.4–10.8)

## 2021-08-20 LAB — LIPID PANEL
Chol/HDL Ratio: 2.2 ratio (ref 0.0–4.4)
Cholesterol, Total: 157 mg/dL (ref 100–199)
HDL: 71 mg/dL (ref >39)
LDL Chol Calc (NIH): 63 mg/dL (ref 0–99)
Triglycerides: 137 mg/dL (ref 0–149)
VLDL Cholesterol Cal: 23 mg/dL (ref 5–40)

## 2021-08-20 LAB — VITAMIN B12: Vitamin B-12: 182 pg/mL — ABNORMAL LOW (ref 232–1245)

## 2021-09-24 ENCOUNTER — Ambulatory Visit (INDEPENDENT_AMBULATORY_CARE_PROVIDER_SITE_OTHER): Payer: Medicare Other | Admitting: Pharmacist

## 2021-09-24 DIAGNOSIS — E1165 Type 2 diabetes mellitus with hyperglycemia: Secondary | ICD-10-CM | POA: Diagnosis not present

## 2021-09-24 DIAGNOSIS — Z7984 Long term (current) use of oral hypoglycemic drugs: Secondary | ICD-10-CM | POA: Diagnosis not present

## 2021-09-24 DIAGNOSIS — E7800 Pure hypercholesterolemia, unspecified: Secondary | ICD-10-CM

## 2021-09-24 DIAGNOSIS — E785 Hyperlipidemia, unspecified: Secondary | ICD-10-CM

## 2021-09-24 DIAGNOSIS — F419 Anxiety disorder, unspecified: Secondary | ICD-10-CM

## 2021-09-24 DIAGNOSIS — I10 Essential (primary) hypertension: Secondary | ICD-10-CM

## 2021-09-24 MED ORDER — CITALOPRAM HYDROBROMIDE 40 MG PO TABS: 40.0000 mg | ORAL_TABLET | Freq: Every day | ORAL | 1 refills | Status: DC

## 2021-09-24 MED ORDER — AMLODIPINE BESYLATE 10 MG PO TABS: 10.0000 mg | ORAL_TABLET | Freq: Every day | ORAL | 1 refills | Status: DC

## 2021-09-24 MED ORDER — METFORMIN HCL 500 MG PO TABS: 500.0000 mg | ORAL_TABLET | Freq: Two times a day (BID) | ORAL | 1 refills | Status: DC

## 2021-09-24 MED ORDER — LOVASTATIN 40 MG PO TABS: 40.0000 mg | ORAL_TABLET | Freq: Every day | ORAL | 1 refills | Status: DC

## 2021-09-24 MED ORDER — LISINOPRIL-HYDROCHLOROTHIAZIDE 20-25 MG PO TABS: 1.0000 | ORAL_TABLET | Freq: Every day | ORAL | 1 refills | Status: DC

## 2021-09-24 NOTE — Patient Instructions (Addendum)
Visit Information  Following are the goals we discussed today:  Current Barriers:  Unable to independently afford treatment regimen Unable to maintain control of T2DM-patient ran out of medicine due to cost  Pharmacist Clinical Goal(s):  patient will verbalize ability to afford treatment regimen maintain control of T2DM as evidenced by GOAL A1C<7%, IMPROVED GLYCEMIC  CONTROL  through collaboration with PharmD and provider.    Interventions: 1:1 collaboration with Gwenlyn Fudge, FNP regarding development and update of comprehensive plan of care as evidenced by provider attestation and co-signature Inter-disciplinary care team collaboration (see longitudinal plan of care) Comprehensive medication review performed; medication list updated in electronic medical record  Diabetes: New goal. A1C 7%--><6% now;  Current treatment:RYBELSUS 14MG , METFORMIN 1G TWICE DAILY;  Denies personal and family history of Medullary thyroid cancer (MTC) DECREASE METFORMIN TO 500MG  (1 TAB) TWICE DAILY--A1C CONTROLLED & WE ARE WATCHING GFR 60-->47 (CKD3A) CONTINUE RYBELSUS 14mg  daily TOLERATING WELL; DENIES SIDE EFFECTS APPLICATION APPROVED FOR NOVO [place] PATIENT ASSISTANCE PROGRAM Current glucose readings: fasting glucose: <150, post prandial glucose: N/A DENIES hypoglycemic/hyperglycemic symptoms Discussed meal planning options and Plate method for healthy eating Avoid sugary drinks and desserts Incorporate balanced protein, non starchy veggies, 1 serving of carbohydrate with each meal Increase water intake Increase physical activity as able Current exercise: N/A Counseled on MEDICATIONS--PURPOSE & SIDE EFFECTS Collaborated with PCP--DECREASED METFORMIN Assessed patient finances. APPLICATION APPROVED FOR RYBELSUS NOVO [place] PATIENT ASSISTANCE-->REFILLS SENT TODAY VIA FAX  Hyperlipidemia:  Goal on Track (progressing): YES. controlled; current treatment: LOVASTATIN;  Medications previously tried:  N/A  Current dietary patterns: ENCOURAGED HEART HEALTHY DIET, DECREASE SUGAR AND FATTY FOODS TO HELP WITH TRIGLYCERIDES Counseled on DIET-HEART HEALTHY Lipid Panel     Component Value Date/Time   CHOL 163 02/18/2021 0831   TRIG 181 (H) 02/18/2021 0831   HDL 63 02/18/2021 0831   CHOLHDL 2.6 02/18/2021 0831   LDLCALC 70 02/18/2021 0831   LABVLDL 30 02/18/2021 0831    Patient Goals/Self-Care Activities patient will:  - take medications as prescribed as evidenced by patient report and record review check glucose DAILY FASTING, document, and provide at future appointments collaborate with provider on medication access solutions target a minimum of 150 minutes of moderate intensity exercise weekly engage in dietary modifications by FOLLOWING HEART HEALTHY DIET   Plan: Telephone follow up appointment with care management team member scheduled for:  4 months  Signature 02/20/2021, PharmD, BCPS Clinical Pharmacist, [facility]  II Phone [phone]   Please call the care guide team at [phone] if you need to cancel or reschedule your appointment.   Patient verbalizes understanding of instructions and care plan provided today and agrees to view in MyChart. Active MyChart status and patient understanding of how to access instructions and care plan via MyChart confirmed with patient.

## 2021-09-24 NOTE — Progress Notes (Signed)
Chronic Care Management Pharmacy Note  09/24/2021 Name:  Jeanne Medina MRN:  [id number] DOB:  04-09-1951  Summary:  Diabetes: Goal on Track (progressing): YES. A1C 7%--><6% now;  Current treatment:RYBELSUS 14MG, METFORMIN 1G TWICE DAILY;  Denies personal and family history of Medullary thyroid cancer (MTC) DECREASE METFORMIN TO 500MG (1 TAB) TWICE DAILY--A1C CONTROLLED & WE ARE WATCHING GFR 60-->47 (CKD3A) CONTINUE RYBELSUS 73m daily TOLERATING WELL; DENIES SIDE EFFECTS [place]PLICATION APPROVED [place] Current glucose readings: fasting glucose: <150, post prandial glucose: N/A DENIES hypoglycemic/hyperglycemic symptoms Discussed meal planning options and Plate method for healthy eating Avoid sugary drinks and desserts Incorporate balanced protein, non starchy veggies, 1 serving of carbohydrate with each meal Increase water intake Increase physical activity as able Current exercise: N/A Counseled on MEDICATIONS--PURPOSE & SIDE EFFECTS Collaborated with PCP--DECREASED METFORMIN Assessed patient finances. APPLICATION APPROVED FOR [place]US [place]-->REFILLS SENT TODAY VIA FAX  Subjective: Jeanne RIGSBEEis an 71y.o. year old female who is a primary patient of JLoman Brooklyn FNP.  The CCM team was consulted for assistance with disease management and care coordination needs.    Engaged with patient by telephone for follow up visit in response to provider referral for pharmacy case management and/or care coordination services.   Consent to Services:  The patient was given information about Chronic Care Management services, agreed to services, and gave verbal consent prior to initiation of services.  Please see initial visit note for detailed documentation.   Patient Care Team: JLoman Brooklyn FNP as PCP - General (Family Medicine) [facility]) LHarlen Labs MD as Referring Physician  (Optometry)  Objective:  Lab Results  Component Value Date   CREATININE 1.24 (H) 08/19/2021   CREATININE 1.00 03/27/2021   CREATININE 1.71 (H) 03/19/2021    Lab Results  Component Value Date   HGBA1C 5.8 (H) 08/19/2021   Last diabetic Eye exam:  Lab Results  Component Value Date/Time   HMDIABEYEEXA No Retinopathy 07/30/2020 12:00 AM    Last diabetic Foot exam: No results found for: HMDIABFOOTEX      Component Value Date/Time   CHOL 157 08/19/2021 0818   TRIG 137 08/19/2021 0818   HDL 71 08/19/2021 0818   CHOLHDL 2.2 08/19/2021 0818   LDLCALC 63 08/19/2021 0818       Latest Ref Rng & Units 08/19/2021    8:18 AM 03/27/2021   11:21 AM 03/19/2021    5:15 AM  Hepatic Function  Total Protein 6.0 - 8.5 g/dL 6.4   6.2     Albumin 3.7 - 4.7 g/dL 4.2   4.3   2.7    AST 0 - 40 IU/L 10   8     ALT 0 - 32 IU/L 6   6     Alk Phosphatase 44 - 121 IU/L 73   75     Total Bilirubin 0.0 - 1.2 mg/dL 0.4   0.5       No results found for: TSH, FREET4     Latest Ref Rng & Units 08/19/2021    8:18 AM 03/27/2021   11:21 AM 03/19/2021   10:22 AM  CBC  WBC 3.4 - 10.8 x10E3/uL 7.9   9.9   14.0    Hemoglobin 11.1 - 15.9 g/dL 11.7   11.5   11.2    Hematocrit 34.0 - 46.6 % 36.3   34.7   34.1    Platelets 150 -  450 x10E3/uL 403   442   564      No results found for: VD25OH  Clinical ASCVD: No  The 10-year ASCVD risk score (Arnett DK, et al., 2019) is: 19.4%   Values used to calculate the score:     Age: 71 years     Sex: Female     Is Non-Hispanic African American: No     Diabetic: Yes     Tobacco smoker: No     Systolic Blood Pressure: 601 mmHg     Is BP treated: Yes     HDL Cholesterol: 71 mg/dL     Total Cholesterol: 157 mg/dL    Other: (CHADS2VASc if Afib, PHQ9 if depression, MMRC or CAT for COPD, ACT, DEXA)  Social History   Tobacco Use  Smoking Status Former   Packs/day: 0.25   Years: 20.00   Pack years: 5.00   Types: Cigarettes   Quit date: 08/13/1980   Years  since quitting: 41.1  Smokeless Tobacco Never   BP Readings from Last 3 Encounters:  08/19/21 116/62  05/21/21 (!) 152/75  03/27/21 (!) 141/83   Pulse Readings from Last 3 Encounters:  08/19/21 91  05/21/21 93  03/27/21 89   Wt Readings from Last 3 Encounters:  08/19/21 157 lb (71.2 kg)  05/21/21 163 lb 6.4 oz (74.1 kg)  03/27/21 167 lb 12.8 oz (76.1 kg)    Assessment: Review of patient past medical history, allergies, medications, health status, including review of consultants reports, laboratory and other test data, was performed as part of comprehensive evaluation and provision of chronic care management services.   SDOH:  (Social Determinants of Health) assessments and interventions performed:    CCM Care Plan  Allergies  Allergen Reactions   Farxiga [Dapagliflozin]     Yeast infections   Sulfa Antibiotics Hives and Itching    welts    Medications Reviewed Today     Reviewed by Lavera Guise, [facility] (Pharmacist) on 09/24/21 at 1526  Med List Status: <None>   Medication Order Taking? Sig Documenting Provider Last Dose Status Informant  acetaminophen (TYLENOL) 325 MG tablet [id number] No Take 2 tablets (650 mg total) by mouth every 6 (six) hours as needed for mild pain (or Fever >/= 101). Roxan Hockey, MD Taking Active   albuterol (PROVENTIL) (2.5 MG/3ML) 0.083% nebulizer solution [id number] No Take 3 mLs (2.5 mg total) by nebulization every 2 (two) hours as needed for wheezing or shortness of breath. Roxan Hockey, MD Taking Active   albuterol (VENTOLIN HFA) 108 (90 Base) MCG/ACT inhaler [id number] No Inhale 2 puffs into the lungs every 4 (four) hours as needed for wheezing or shortness of breath. Roxan Hockey, MD Taking Active   amLODipine (NORVASC) 10 MG tablet [id number] No Take 1 tablet (10 mg total) by mouth daily. Roxan Hockey, MD Taking Active   citalopram (CELEXA) 40 MG tablet [id number] No Take 1 tablet (40 mg total) by mouth daily. Loman Brooklyn,  FNP Taking Active   diphenhydrAMINE (BENADRYL) 25 MG tablet [id number] No Take 25 mg by mouth at bedtime as needed (for allergies.). [provider] Taking Active Self  fluticasone (FLONASE) 50 MCG/ACT nasal spray [id number] No Use 1 spray(s) in each nostril twice daily Hendricks Limes F, FNP Taking Active   guaiFENesin (MUCINEX) 600 MG 12 hr tablet [id number] No Take 1 tablet (600 mg total) by mouth 2 (two) times daily. Roxan Hockey, MD Taking Active   L-Lysine 500 MG CAPS [id number] No Take 1 capsule  by mouth daily. [provider] Taking Active Self  lisinopril-hydrochlorothiazide (ZESTORETIC) 20-25 MG tablet [id number] No Take 1 tablet by mouth daily. Roxan Hockey, MD Taking Active   lovastatin (MEVACOR) 40 MG tablet [id number] No Take 1 tablet (40 mg total) by mouth at bedtime. Loman Brooklyn, FNP Taking Active   metFORMIN (GLUCOPHAGE) 500 MG tablet [id number]  Take 2 tablets (1,000 mg total) by mouth 2 (two) times daily with a meal. Loman Brooklyn, FNP  Active   Semaglutide (RYBELSUS) 14 MG TABS [id number] No Take 14 mg by mouth daily. Loman Brooklyn, FNP Taking Active   traZODone (DESYREL) 50 MG tablet [id number] No Take 1 tablet (50 mg total) by mouth at bedtime as needed for sleep. Loman Brooklyn, FNP Taking Active             Patient Active Problem List   Diagnosis Date Noted   Type 2 diabetes mellitus with hyperglycemia, without long-term current use of insulin ([place]) 05/16/2020   Difficulty sleeping 05/16/2020   History of COVID-19 05/16/2020   Atrophic vaginitis 11/04/2018   Mild intermittent asthma without complication 99/37/1696   Essential hypertension    Hypercholesteremia    Anxiety    Arthritis     Immunization History  Administered Date(s) Administered   Influenza Split 02/10/2017   Influenza,inj,Quad PF,6+ Mos 01/26/2019   Influenza,inj,quad, With Preservative 01/28/2018   Influenza-Unspecified 03/14/2007, 02/09/2014, 02/08/2015,  01/28/2018, 02/02/2020, 02/13/2021   Pneumococcal Conjugate-13 02/09/2014   Pneumococcal Polysaccharide-23 05/14/2015   Td 05/09/1998   Tdap 06/11/2017   Zoster Recombinat (Shingrix) 02/02/2020, 07/11/2020   Zoster, Live 12/02/2012    Conditions to be addressed/monitored: DMII and CKD Stage 3A  Care Plan : PHARMD MEDICATION MANAGEMENT  Updates made by Lavera Guise, [place] since 09/24/2021 12:00 AM     Problem: DISEASE PROGRESSION PREVENTION      Long-Range Goal: T2DM   Recent Progress: Not on track  Priority: High  Note:   Current Barriers:  Unable to independently afford treatment regimen Unable to maintain control of T2DM -patient ran out of medicine due to cost  Pharmacist Clinical Goal(s):  patient will verbalize ability to afford treatment regimen maintain control of T2DM as evidenced by GOAL A1C<7%, IMPROVED GLYCEMIC  CONTROL  through collaboration with PharmD and provider.    Interventions: 1:1 collaboration with Loman Brooklyn, FNP regarding development and update of comprehensive plan of care as evidenced by provider attestation and co-signature Inter-disciplinary care team collaboration (see longitudinal plan of care) Comprehensive medication review performed; medication list updated in electronic medical record  Diabetes: New goal. A1C 7%--><6% now;  Current treatment:RYBELSUS 14MG, METFORMIN 1G TWICE DAILY;  Denies personal and family history of Medullary thyroid cancer (MTC) DECREASE METFORMIN TO 500MG (1 TAB) TWICE DAILY--A1C CONTROLLED & WE ARE WATCHING GFR 60-->47 (CKD3A) CONTINUE RYBELSUS 72m daily TOLERATING WELL; DENIES SIDE EFFECTS APPLICATION APPROVED FOR NOVO [place]PATIENT ASSISTANCE PROGRAM Current glucose readings: fasting glucose: <150, post prandial glucose: N/A DENIES hypoglycemic/hyperglycemic symptoms Discussed meal planning options and Plate method for healthy eating Avoid sugary drinks and desserts Incorporate balanced protein, non  starchy veggies, 1 serving of carbohydrate with each meal Increase water intake Increase physical activity as able Current exercise: N/A Counseled on MEDICATIONS--PURPOSE & SIDE EFFECTS Collaborated with PCP--DECREASED METFORMIN Assessed patient finances. APPLICATION APPROVED FOR [place]US [place]IENT [place]ANCE-->REFILLS SENT TODAY VIA FAX  Hyperlipidemia:  Goal on Track (progressing): YES. controlled; current treatment: LOVASTATIN;  Medications previously tried: N/A  Current dietary patterns: ENCOURAGED  HEART HEALTHY DIET, DECREASE SUGAR AND FATTY FOODS TO HELP WITH TRIGLYCERIDES Counseled on DIET-HEART HEALTHY Lipid Panel     Component Value Date/Time   CHOL 163 02/18/2021 0831   TRIG 181 (H) 02/18/2021 0831   HDL 63 02/18/2021 0831   CHOLHDL 2.6 02/18/2021 0831   LDLCALC 70 02/18/2021 0831   LABVLDL 30 02/18/2021 0831   Patient Goals/Self-Care Activities patient will:  - take medications as prescribed as evidenced by patient report and record review check glucose DAILY FASTING, document, and provide at future appointments collaborate with provider on medication access solutions target a minimum of 150 minutes of moderate intensity exercise weekly engage in dietary modifications by FOLLOWING HEART HEALTHY DIET      Follow Up:  Patient agrees to Care Plan and Follow-up.  Plan: Telephone follow up appointment with care management team member scheduled for:  4 MONTHS    Regina Eck, PharmD, BCPS Clinical Pharmacist, [place]  II Phone [phone]

## 2021-10-06 ENCOUNTER — Ambulatory Visit (INDEPENDENT_AMBULATORY_CARE_PROVIDER_SITE_OTHER): Payer: Medicare Other

## 2021-10-06 ENCOUNTER — Ambulatory Visit: Payer: Medicare Other

## 2021-10-06 VITALS — Wt 157.0 lb

## 2021-10-06 DIAGNOSIS — Z0000 Encounter for general adult medical examination without abnormal findings: Secondary | ICD-10-CM | POA: Diagnosis not present

## 2021-10-06 NOTE — Progress Notes (Signed)
Subjective:   Jeanne Medina is a 71 y.o. female who presents for Medicare Annual (Subsequent) preventive examination.  Virtual Visit via Telephone Note  I connected with  [name] on 10/06/21 at 11:15 AM EDT by telephone and verified that I am speaking with the correct person using two identifiers.  Location: Patient: Home Provider: [facility] Persons participating in the virtual visit: patient/Nurse Health Advisor   I discussed the limitations, risks, security and privacy concerns of performing an evaluation and management service by telephone and the availability of in person appointments. The patient expressed understanding and agreed to proceed.  Interactive audio and video telecommunications were attempted between this nurse and patient, however failed, due to patient having technical difficulties OR patient did not have access to video capability.  We continued and completed visit with audio only.  Some vital signs may be absent or patient reported.   Mohini Heathcock E Zhana Jeangilles, LPN   Review of Systems     Cardiac Risk Factors include: advanced age (>34men, >37 women);diabetes mellitus;dyslipidemia;hypertension     Objective:    Today's Vitals   10/06/21 1112  Weight: 157 lb (71.2 kg)   Body mass index is 27.81 kg/m.     10/06/2021   11:17 AM 03/18/2021    4:00 PM 03/17/2021    7:46 AM 10/03/2020   12:50 PM 10/03/2019    2:21 PM 10/06/2016    7:20 AM 08/18/2016    8:41 AM  Advanced Directives  Does Patient Have a Medical Advance Directive? Yes No Yes Yes Yes No No  Type of Paramedic of [place];Living will Healthcare Power of [place] of [place];Living will [place]    Does patient want to make changes to medical advance directive?  No - Patient declined No - Patient declined  No - Patient declined    Copy of [place] in Chart? Yes - validated most recent copy scanned  in chart (See row information) Yes - validated most recent copy scanned in chart (See row information) Yes - validated most recent copy scanned in chart (See row information) No - copy requested No - copy requested    Would patient like information on creating a medical advance directive?  No - Patient declined         Current Medications (verified) Outpatient Encounter Medications as of 10/06/2021  Medication Sig   acetaminophen (TYLENOL) 325 MG tablet Take 2 tablets (650 mg total) by mouth every 6 (six) hours as needed for mild pain (or Fever >/= 101).   albuterol (PROVENTIL) (2.5 MG/3ML) 0.083% nebulizer solution Take 3 mLs (2.5 mg total) by nebulization every 2 (two) hours as needed for wheezing or shortness of breath.   albuterol (VENTOLIN HFA) 108 (90 Base) MCG/ACT inhaler Inhale 2 puffs into the lungs every 4 (four) hours as needed for wheezing or shortness of breath.   amLODipine (NORVASC) 10 MG tablet Take 1 tablet (10 mg total) by mouth daily.   citalopram (CELEXA) 40 MG tablet Take 1 tablet (40 mg total) by mouth daily.   diphenhydrAMINE (BENADRYL) 25 MG tablet Take 25 mg by mouth at bedtime as needed (for allergies.).   fluticasone (FLONASE) 50 MCG/ACT nasal spray Use 1 spray(s) in each nostril twice daily   L-Lysine 500 MG CAPS Take 1 capsule by mouth daily.   lisinopril-hydrochlorothiazide (ZESTORETIC) 20-25 MG tablet Take 1 tablet by mouth daily.   lovastatin (MEVACOR) 40 MG tablet  Take 1 tablet (40 mg total) by mouth at bedtime.   metFORMIN (GLUCOPHAGE) 500 MG tablet Take 1 tablet (500 mg total) by mouth 2 (two) times daily with a meal.   Semaglutide (RYBELSUS) 14 MG TABS Take 14 mg by mouth daily.   traZODone (DESYREL) 50 MG tablet Take 1 tablet (50 mg total) by mouth at bedtime as needed for sleep.   No facility-administered encounter medications on file as of 10/06/2021.    Allergies (verified) Farxiga [dapagliflozin] and Sulfa antibiotics   History: Past Medical History:   Diagnosis Date   Anxiety    Arthritis    osteoarthritis   Asthma    Diabetes mellitus without complication ([place])    Hypercholesteremia    Hypertension    Past Surgical History:  Procedure Laterality Date   CATARACT EXTRACTION W/PHACO Left 08/18/2016   Procedure: CATARACT EXTRACTION PHACO AND INTRAOCULAR LENS PLACEMENT ([place]);  Surgeon: Rutherford Guys, MD;  Location: [facility];  Service: Ophthalmology;  Laterality: Left;  CDE: 6.05   CATARACT EXTRACTION W/PHACO Left 09/01/2016   Procedure: REPOSITIONING OF LEFT IOL LENS;  Surgeon: Rutherford Guys, MD;  Location: [facility];  Service: Ophthalmology;  Laterality: Left;   CATARACT EXTRACTION W/PHACO Right 10/06/2016   Procedure: CATARACT EXTRACTION PHACO AND INTRAOCULAR LENS PLACEMENT ([facility]);  Surgeon: Rutherford Guys, MD;  Location: [facility];  Service: Ophthalmology;  Laterality: Right;  CDE: 6.21   Family History  Problem Relation Age of Onset   Heart failure Mother    Hypertension Mother    Heart disease Mother    Stroke Father    Heart disease Father    Hypertension Father    Dementia Sister    Gallbladder disease Brother    Pancreatic cancer Brother    Heart attack Brother    Rheum arthritis Sister    Heart disease Sister    Cancer Sister        tonsils   Diabetes Sister    Diabetes Sister    Heart disease Sister    Rheum arthritis Sister    Social History   Socioeconomic History   Marital status: Married    Spouse name: Sonia Side   Number of children: 0   Years of education: Not on file   Highest education level: Not on file  Occupational History   Occupation: retired  Tobacco Use   Smoking status: Former    Packs/day: 0.25    Years: 20.00    Total pack years: 5.00    Types: Cigarettes    Quit date: 08/13/1980    Years since quitting: 41.1   Smokeless tobacco: Never  Substance and Sexual Activity   Alcohol use: No   Drug use: No   Sexual activity: Yes    Birth control/protection: Post-menopausal  Other Topics Concern   Not  on file  Social History Narrative   Lives home with her husband. Works out at [employer] 60 min 3x per week and walks 30 minutes other days.   Sister lives in [place] Determinants of Health   Financial Resource Strain: Low Risk  (10/06/2021)   Overall Financial Resource Strain (CARDIA)    Difficulty of Paying Living Expenses: Not hard at all  Food Insecurity: No Food Insecurity (10/06/2021)   Hunger Vital Sign    Worried About Running Out of Food in the Last Year: Never true    Ran Out of Food in the Last Year: Never true  Transportation Needs: No Transportation Needs (10/06/2021)   PRAPARE -  Hydrologist (Medical): No    Lack of Transportation (Non-Medical): No  Physical Activity: Sufficiently Active (10/06/2021)   Exercise Vital Sign    Days of Exercise per Week: 3 days    Minutes of Exercise per Session: 60 min  Stress: No Stress Concern Present (10/06/2021)   [place]    Feeling of Stress : Not at all  Social Connections: [place] (10/06/2021)   Social Connection and Isolation Panel [NHANES]    Frequency of Communication with Friends and Family: More than three times a week    Frequency of Social Gatherings with Friends and Family: More than three times a week    Attends Religious Services: More than 4 times per year    Active Member of [employer] or Organizations: Yes    Attends Music therapist: More than 4 times per year    Marital Status: Married    Tobacco Counseling Counseling given: Not Answered   Clinical Intake:  Pre-visit preparation completed: Yes  Pain : No/denies pain     BMI - recorded: 27.81 Nutritional Status: BMI 25 -29 Overweight Nutritional Risks: None Diabetes: Yes CBG done?: No Did pt. bring in CBG monitor from home?: No  How often do you need to have someone help you when you read instructions, pamphlets, or other written  materials from your doctor or pharmacy?: 1 - Never  Diabetic? Nutrition Risk Assessment:  Has the patient had any N/V/D within the last 2 months?  No  Does the patient have any non-healing wounds?  No  Has the patient had any unintentional weight loss or weight gain?  No   Diabetes:  Is the patient diabetic?  Yes  If diabetic, was a CBG obtained today?  No  Did the patient bring in their glucometer from home?  No  How often do you monitor your CBG's? BID - 132 this am per patient.   Financial Strains and Diabetes Management:  Are you having any financial strains with the device, your supplies or your medication? No .  Does the patient want to be seen by Chronic Care Management for management of their diabetes?  No  Would the patient like to be referred to a Nutritionist or for Diabetic Management?  No   Diabetic Exams:  Diabetic Eye Exam: Completed 04/2021 - requesting records  Diabetic Foot Exam: Completed 02/18/2021. Pt has been advised about the importance in completing this exam.   Interpreter Needed?: No  Information entered by :: Rande Roylance, LPN   Activities of Daily Living    10/06/2021   11:17 AM 03/18/2021    4:00 PM  In your present state of health, do you have any difficulty performing the following activities:  Hearing? 0 0  Vision? 0 0  Difficulty concentrating or making decisions? 0 0  Walking or climbing stairs? 0 0  Dressing or bathing? 0 0  Doing errands, shopping? 0 0  Preparing Food and eating ? N   Using the Toilet? N   In the past six months, have you accidently leaked urine? N   Do you have problems with loss of bowel control? N   Managing your Medications? N   Managing your Finances? N   Housekeeping or managing your Housekeeping? N     Patient Care Team: Loman Brooklyn, FNP as PCP - General (Family Medicine) Lavera Guise, [facility] (Pharmacist) Harlen Labs, MD as Referring Physician (  Optometry)  Indicate any recent Medical Services  you may have received from other than [facility] providers in the past year (date may be approximate).     Assessment:   This is a routine wellness examination for [place].  Hearing/Vision screen Hearing Screening - Comments:: Denies hearing difficulties   Vision Screening - Comments:: Denies vision difficulties  - up to date with routine eye exams with [facility]   Dietary issues and exercise activities discussed: Current Exercise Habits: Home exercise routine, Type of exercise: treadmill;strength training/weights;Other - see comments (water aerobics), Time (Minutes): 60, Frequency (Times/Week): 3, Weekly Exercise (Minutes/Week): 180, Intensity: Mild, Exercise limited by: None identified   Goals Addressed             This Visit's Progress    Patient Stated   On track    10/06/2021 - continue going to [facility] - increase strength - maintain independence       Depression Screen    10/06/2021   11:35 AM 08/19/2021    8:04 AM 05/21/2021    7:59 AM 03/27/2021   10:19 AM 02/18/2021    8:12 AM 11/14/2020    9:50 AM 10/03/2020   11:26 AM  PHQ 2/9 Scores  PHQ - 2 Score 0 0 0 0 0 0 0  PHQ- 9 Score 0 1 0 2 0 0 0    Fall Risk    10/06/2021   11:13 AM 08/19/2021    8:04 AM 05/21/2021    7:59 AM 03/27/2021   10:19 AM 02/18/2021    8:11 AM  Fall Risk   Falls in the past year? 0 0 0 0 0  Number falls in past yr: 0      Injury with Fall? 0      Risk for fall due to : No Fall Risks      Follow up Falls prevention discussed    Falls evaluation completed    FALL RISK PREVENTION PERTAINING TO THE HOME:  Any stairs in or around the home? Yes  If so, are there any without handrails?  Has a wall to hold on to Home free of loose throw rugs in walkways, pet beds, electrical cords, etc? Yes  Adequate lighting in your home to reduce risk of falls? Yes   ASSISTIVE DEVICES UTILIZED TO PREVENT FALLS:  Life alert? No  Use of a cane, walker or w/c? No  Grab bars in the bathroom? No  Shower chair  or bench in shower? No  Elevated toilet seat or a handicapped toilet? No   TIMED UP AND GO:  Was the test performed? No . Telephonic visit  Cognitive Function:        10/06/2021   11:18 AM 10/03/2019    2:18 PM  6CIT Screen  What Year? 0 points 0 points  What month? 0 points 0 points  What time? 0 points 0 points  Count back from 20 0 points 0 points  Months in reverse 2 points 0 points  Repeat phrase 2 points 0 points  Total Score 4 points 0 points    Immunizations Immunization History  Administered Date(s) Administered   Influenza Split 02/10/2017   Influenza,inj,Quad PF,6+ Mos 01/26/2019   Influenza,inj,quad, With Preservative 01/28/2018   Influenza-Unspecified 03/14/2007, 02/09/2014, 02/08/2015, 01/28/2018, 02/02/2020, 02/13/2021   Pneumococcal Conjugate-13 02/09/2014   Pneumococcal Polysaccharide-23 05/14/2015   Td 05/09/1998   Tdap 06/11/2017   Zoster Recombinat (Shingrix) 02/02/2020, 07/11/2020   Zoster, Live 12/02/2012    TDAP status: Up  to date  Flu Vaccine status: Up to date  Pneumococcal vaccine status: Up to date  Covid-19 vaccine status: Declined, Education has been provided regarding the importance of this vaccine but patient still declined. Advised may receive this vaccine at local pharmacy or Health Dept.or vaccine clinic. Aware to provide a copy of the vaccination record if obtained from local pharmacy or Health Dept. Verbalized acceptance and understanding.  Qualifies for Shingles Vaccine? Yes   Zostavax completed Yes   Shingrix Completed?: Yes  Screening Tests Health Maintenance  Topic Date Due   COVID-19 Vaccine (1) Never done   OPHTHALMOLOGY EXAM  07/30/2021   DEXA SCAN  02/18/2022 (Originally 03/26/2015)   INFLUENZA VACCINE  11/25/2021   MAMMOGRAM  11/29/2021   FOOT EXAM  02/18/2022   HEMOGLOBIN A1C  02/18/2022   Fecal DNA (Cologuard)  04/01/2024   TETANUS/TDAP  06/12/2027   Pneumonia Vaccine 66+ Years old  Completed   Hepatitis C  Screening  Completed   Zoster Vaccines- Shingrix  Completed   HPV VACCINES  Aged Out    Health Maintenance  Health Maintenance Due  Topic Date Due   COVID-19 Vaccine (1) Never done   OPHTHALMOLOGY EXAM  07/30/2021    Colorectal cancer screening: Type of screening: Cologuard. Completed 04/01/2021. Repeat every 3 years  Mammogram status: Completed 11/29/2020. Repeat every year  Declined DEXA  Lung Cancer Screening: (Low Dose CT Chest recommended if Age 55-80 years, 30 pack-year currently smoking OR have quit w/in 15years.) does not qualify.  Additional Screening:  Hepatitis C Screening: does qualify; Completed 12/23/2017  Vision Screening: Recommended annual ophthalmology exams for early detection of glaucoma and other disorders of the eye. Is the patient up to date with their annual eye exam?  Yes  Who is the provider or what is the name of the office in which the patient attends annual eye exams? [place] If pt is not established with a provider, would they like to be referred to a provider to establish care? No .   Dental Screening: Recommended annual dental exams for proper oral hygiene  Community Resource Referral / Chronic Care Management: CRR required this visit?  No   CCM required this visit?  No      Plan:     I have personally reviewed and noted the following in the patient's chart:   Medical and social history Use of alcohol, tobacco or illicit drugs  Current medications and supplements including opioid prescriptions.  Functional ability and status Nutritional status Physical activity Advanced directives List of other physicians Hospitalizations, surgeries, and ER visits in previous 12 months Vitals Screenings to include cognitive, depression, and falls Referrals and appointments  In addition, I have reviewed and discussed with patient certain preventive protocols, quality metrics, and best practice recommendations. A written personalized  care plan for preventive services as well as general preventive health recommendations were provided to patient.     Sandrea Hammond, LPN   [id number]   Nurse Notes: None

## 2021-10-06 NOTE — Patient Instructions (Signed)
Jeanne Medina , Thank you for taking time to come for your Medicare Wellness Visit. I appreciate your ongoing commitment to your health goals. Please review the following plan we discussed and let me know if I can assist you in the future.   Screening recommendations/referrals: Colonoscopy: Cologuard done 04/01/2021 - repeat in 3 years Mammogram: Done 11/29/2020 - Repeat annually Bone Density: Declined - recommended every 2 years for women over 65 Recommended yearly ophthalmology/optometry visit for glaucoma screening and checkup Recommended yearly dental visit for hygiene and checkup  Vaccinations: Influenza vaccine: Done 02/13/2021 - Repeat annually Pneumococcal vaccine: Done 02/09/2014 & 05/14/2015   Tdap vaccine: Done 06/11/2017 - Repeat in 10 years Shingles vaccine: Done  02/02/2020 & 07/11/2020 Covid-19: Declined  Advanced directives:in chart  Conditions/risks identified: Keep up the great work! Continue going to the [facility], exercising. Aim for 6-8 glasses of water daily and 5 servings of fruits/vegetables!  Next appointment: Follow up in one year for your annual wellness visit    Preventive Care 65 Years and Older, Female Preventive care refers to lifestyle choices and visits with your health care provider that can promote health and wellness. What does preventive care include? A yearly physical exam. This is also called an annual well check. Dental exams once or twice a year. Routine eye exams. Ask your health care provider how often you should have your eyes checked. Personal lifestyle choices, including: Daily care of your teeth and gums. Regular physical activity. Eating a healthy diet. Avoiding tobacco and drug use. Limiting alcohol use. Practicing safe sex. Taking low-dose aspirin every day. Taking vitamin and mineral supplements as recommended by your health care provider. What happens during an annual well check? The services and screenings done by your health care provider  during your annual well check will depend on your age, overall health, lifestyle risk factors, and family history of disease. Counseling  Your health care provider may ask you questions about your: Alcohol use. Tobacco use. Drug use. Emotional well-being. Home and relationship well-being. Sexual activity. Eating habits. History of falls. Memory and ability to understand (cognition). Work and work Astronomer. Reproductive health. Screening  You may have the following tests or measurements: Height, weight, and BMI. Blood pressure. Lipid and cholesterol levels. These may be checked every 5 years, or more frequently if you are over 28 years old. Skin check. Lung cancer screening. You may have this screening every year starting at age 24 if you have a 30-pack-year history of smoking and currently smoke or have quit within the past 15 years. Fecal occult blood test (FOBT) of the stool. You may have this test every year starting at age 44. Flexible sigmoidoscopy or colonoscopy. You may have a sigmoidoscopy every 5 years or a colonoscopy every 10 years starting at age 27. Hepatitis C blood test. Hepatitis B blood test. Sexually transmitted disease (STD) testing. Diabetes screening. This is done by checking your blood sugar (glucose) after you have not eaten for a while (fasting). You may have this done every 1-3 years. Bone density scan. This is done to screen for osteoporosis. You may have this done starting at age 40. Mammogram. This may be done every 1-2 years. Talk to your health care provider about how often you should have regular mammograms. Talk with your health care provider about your test results, treatment options, and if necessary, the need for more tests. Vaccines  Your health care provider may recommend certain vaccines, such as: Influenza vaccine. This is recommended every year. Tetanus,  diphtheria, and acellular pertussis (Tdap, Td) vaccine. You may need a Td booster every  10 years. Zoster vaccine. You may need this after age 76. Pneumococcal 13-valent conjugate (PCV13) vaccine. One dose is recommended after age 51. Pneumococcal polysaccharide (PPSV23) vaccine. One dose is recommended after age 30. Talk to your health care provider about which screenings and vaccines you need and how often you need them. This information is not intended to replace advice given to you by your health care provider. Make sure you discuss any questions you have with your health care provider. Document Released: 05/10/2015 Document Revised: 01/01/2016 Document Reviewed: 02/12/2015 Elsevier Interactive Patient Education  2017 [place] Prevention in the Home Falls can cause injuries. They can happen to people of all ages. There are many things you can do to make your home safe and to help prevent falls. What can I do on the outside of my home? Regularly fix the edges of walkways and driveways and fix any cracks. Remove anything that might make you trip as you walk through a door, such as a raised step or threshold. Trim any bushes or trees on the path to your home. Use bright outdoor lighting. Clear any walking paths of anything that might make someone trip, such as rocks or tools. Regularly check to see if handrails are loose or broken. Make sure that both sides of any steps have handrails. Any raised decks and porches should have guardrails on the edges. Have any leaves, snow, or ice cleared regularly. Use sand or salt on walking paths during winter. Clean up any spills in your garage right away. This includes oil or grease spills. What can I do in the bathroom? Use night lights. Install grab bars by the toilet and in the tub and shower. Do not use towel bars as grab bars. Use non-skid mats or decals in the tub or shower. If you need to sit down in the shower, use a plastic, non-slip stool. Keep the floor dry. Clean up any water that spills on the floor as soon as it  happens. Remove soap buildup in the tub or shower regularly. Attach bath mats securely with double-sided non-slip rug tape. Do not have throw rugs and other things on the floor that can make you trip. What can I do in the bedroom? Use night lights. Make sure that you have a light by your bed that is easy to reach. Do not use any sheets or blankets that are too big for your bed. They should not hang down onto the floor. Have a firm chair that has side arms. You can use this for support while you get dressed. Do not have throw rugs and other things on the floor that can make you trip. What can I do in the kitchen? Clean up any spills right away. Avoid walking on wet floors. Keep items that you use a lot in easy-to-reach places. If you need to reach something above you, use a strong step stool that has a grab bar. Keep electrical cords out of the way. Do not use floor polish or wax that makes floors slippery. If you must use wax, use non-skid floor wax. Do not have throw rugs and other things on the floor that can make you trip. What can I do with my stairs? Do not leave any items on the stairs. Make sure that there are handrails on both sides of the stairs and use them. Fix handrails that are broken or loose. Make  sure that handrails are as long as the stairways. Check any carpeting to make sure that it is firmly attached to the stairs. Fix any carpet that is loose or worn. Avoid having throw rugs at the top or bottom of the stairs. If you do have throw rugs, attach them to the floor with carpet tape. Make sure that you have a light switch at the top of the stairs and the bottom of the stairs. If you do not have them, ask someone to add them for you. What else can I do to help prevent falls? Wear shoes that: Do not have high heels. Have rubber bottoms. Are comfortable and fit you well. Are closed at the toe. Do not wear sandals. If you use a stepladder: Make sure that it is fully opened.  Do not climb a closed stepladder. Make sure that both sides of the stepladder are locked into place. Ask someone to hold it for you, if possible. Clearly mark and make sure that you can see: Any grab bars or handrails. First and last steps. Where the edge of each step is. Use tools that help you move around (mobility aids) if they are needed. These include: Canes. Walkers. Scooters. Crutches. Turn on the lights when you go into a dark area. Replace any light bulbs as soon as they burn out. Set up your furniture so you have a clear path. Avoid moving your furniture around. If any of your floors are uneven, fix them. If there are any pets around you, be aware of where they are. Review your medicines with your doctor. Some medicines can make you feel dizzy. This can increase your chance of falling. Ask your doctor what other things that you can do to help prevent falls. This information is not intended to replace advice given to you by your health care provider. Make sure you discuss any questions you have with your health care provider. Document Released: 02/07/2009 Document Revised: 09/19/2015 Document Reviewed: 05/18/2014 Elsevier Interactive Patient Education  2017 [employer].

## 2021-11-11 ENCOUNTER — Other Ambulatory Visit: Payer: Self-pay | Admitting: Family Medicine

## 2021-11-11 DIAGNOSIS — G479 Sleep disorder, unspecified: Secondary | ICD-10-CM

## 2021-12-09 ENCOUNTER — Telehealth: Payer: Medicare Other

## 2021-12-17 ENCOUNTER — Telehealth: Payer: Self-pay

## 2021-12-17 NOTE — Telephone Encounter (Signed)
Received patient assistance - rybels[employer]  Patient aware- medication placed up front

## 2021-12-25 ENCOUNTER — Encounter: Payer: Self-pay | Admitting: Family Medicine

## 2021-12-25 ENCOUNTER — Ambulatory Visit (INDEPENDENT_AMBULATORY_CARE_PROVIDER_SITE_OTHER): Payer: Medicare Other | Admitting: Family Medicine

## 2021-12-25 VITALS — BP 144/74 | HR 87 | Temp 97.3°F | Ht 63.0 in | Wt 166.0 lb

## 2021-12-25 DIAGNOSIS — Z0001 Encounter for general adult medical examination with abnormal findings: Secondary | ICD-10-CM | POA: Diagnosis not present

## 2021-12-25 DIAGNOSIS — E7800 Pure hypercholesterolemia, unspecified: Secondary | ICD-10-CM

## 2021-12-25 DIAGNOSIS — E1165 Type 2 diabetes mellitus with hyperglycemia: Secondary | ICD-10-CM

## 2021-12-25 DIAGNOSIS — N1831 Chronic kidney disease, stage 3a: Secondary | ICD-10-CM

## 2021-12-25 DIAGNOSIS — Z0000 Encounter for general adult medical examination without abnormal findings: Secondary | ICD-10-CM

## 2021-12-25 DIAGNOSIS — I10 Essential (primary) hypertension: Secondary | ICD-10-CM

## 2021-12-25 DIAGNOSIS — N183 Chronic kidney disease, stage 3 unspecified: Secondary | ICD-10-CM | POA: Insufficient documentation

## 2021-12-25 DIAGNOSIS — F419 Anxiety disorder, unspecified: Secondary | ICD-10-CM

## 2021-12-25 DIAGNOSIS — G479 Sleep disorder, unspecified: Secondary | ICD-10-CM

## 2021-12-25 LAB — BAYER DCA HB A1C WAIVED: HB A1C (BAYER DCA - WAIVED): 7.6 % — ABNORMAL HIGH (ref 4.8–5.6)

## 2021-12-25 MED ORDER — DAPAGLIFLOZIN PROPANEDIOL 5 MG PO TABS: 5.0000 mg | ORAL_TABLET | Freq: Every day | ORAL | 2 refills | Status: DC

## 2021-12-25 NOTE — Patient Instructions (Signed)
Schedule your mammogram;

## 2021-12-25 NOTE — Progress Notes (Signed)
Assessment & Plan:  Well adult exam Discussed health benefits of physical activity, and encouraged her to engage in regular exercise appropriate for her age and condition. Preventive health education provided. Declined COVID vaccine and DEXA. Patient will schedule mammogram. Eye exam requested from [facility].  Immunization History  Administered Date(s) Administered   Influenza Split 02/10/2017   Influenza,inj,Quad PF,6+ Mos 01/26/2019   Influenza,inj,quad, With Preservative 01/28/2018   Influenza-Unspecified 03/14/2007, 02/09/2014, 02/08/2015, 01/28/2018, 02/02/2020, 02/13/2021   Pneumococcal Conjugate-13 02/09/2014   Pneumococcal Polysaccharide-23 05/14/2015   Td 05/09/1998   Tdap 06/11/2017   Zoster Recombinat (Shingrix) 02/02/2020, 07/11/2020   Zoster, Live 12/02/2012   Health Maintenance  Topic Date Due   OPHTHALMOLOGY EXAM  07/30/2021   MAMMOGRAM  11/29/2021   COVID-19 Vaccine (1) 01/10/2022 (Originally 09/23/1951)   DEXA SCAN  02/18/2022 (Originally 03/26/2015)   INFLUENZA VACCINE  07/26/2022 (Originally 11/25/2021)   FOOT EXAM  02/18/2022   HEMOGLOBIN A1C  02/18/2022   Fecal DNA (Cologuard)  04/01/2024   TETANUS/TDAP  06/12/2027   Pneumonia Vaccine 38+ Years old  Completed   Hepatitis C Screening  Completed   Zoster Vaccines- Shingrix  Completed   HPV VACCINES  Aged Out   - Lipid panel - CBC with Differential/Platelet - CMP14+EGFR  2. Type 2 diabetes mellitus with hyperglycemia, without long-term current use of insulin (HCC) Lab Results  Component Value Date   HGBA1C 7.6 (H) 12/25/2021   HGBA1C 5.8 (H) 08/19/2021   HGBA1C 7.2 (H) 03/18/2021    - Diabetes is not at goal of A1c < 7, since decreasing metformin due to declining kidney function.  - Medications: continue current medications, start Farxiga. She has taken this in the past and it caused yeast infections, so we are going to start at the lower dosage and she is going to use wet wipes when she  goes to the restroom. - Home glucose monitoring: continue monitoring - Patient is currently taking a statin. Patient is taking an ACE-inhibitor/ARB.   Diabetes Health Maintenance Due  Topic Date Due   OPHTHALMOLOGY EXAM  07/30/2021   FOOT EXAM  02/18/2022   HEMOGLOBIN A1C  02/18/2022    Lab Results  Component Value Date   LABMICR 21.5 02/18/2021   LABMICR 12.0 01/02/2020   - Lipid panel - CBC with Differential/Platelet - CMP14+EGFR - Bayer DCA Hb A1c Waived - dapagliflozin propanediol (FARXIGA) 5 MG TABS tablet; Take 1 tablet (5 mg total) by mouth daily before breakfast.  Dispense: 30 tablet; Refill: 2  3. Essential hypertension Well controlled on current regimen.  - Lipid panel - CBC with Differential/Platelet - CMP14+EGFR  4. Hypercholesteremia Well controlled on current regimen.  - Lipid panel - CBC with Differential/Platelet - CMP14+EGFR  5. Stage 3a chronic kidney disease ([place]) Start Farxiga 5 mg daily. Not automatically increasing to 10 mg due to trial of medication in the past causing yeast infections. - CMP14+EGFR - dapagliflozin propanediol (FARXIGA) 5 MG TABS tablet; Take 1 tablet (5 mg total) by mouth daily before breakfast.  Dispense: 30 tablet; Refill: 2  6. Anxiety Well controlled on current regimen.  - CMP14+EGFR  7. Difficulty sleeping Well controlled on current regimen.  - CMP14+EGFR   Follow-up: Return in about 3 months (around 03/26/2022) for follow-up of chronic medication conditions with [facility].   Hendricks Limes, MSN, APRN, FNP-C [facility]  Subjective:  Patient ID: Jeanne Medina, female    DOB: 26-Oct-1950  Age: 71 y.o. MRN: [id number]  Patient  Care Team: Loman Brooklyn, FNP as PCP - General (Family Medicine) Lavera Guise, [facility] (Pharmacist) Harlen Labs, MD as Referring Physician (Optometry)   CC:  Chief Complaint  Patient presents with   Annual Exam    BS has been higher since metformin dose has  changed     HPI Jeanne Medina is a 71 y.o. female who presents today for a complete physical exam. She reports consuming a  healthy  diet. Home exercise routine includes cardio and weight training. She generally feels well. She reports sleeping well. She does not have additional problems to discuss today.   Vision:Within last year Dental:No regular dental care  Advanced Directives Patient does have advanced directives including living will and healthcare power of attorney. She does have a copy in the electronic medical record.   DEPRESSION SCREENING    12/25/2021    9:10 AM 10/06/2021   11:35 AM 08/19/2021    8:04 AM 05/21/2021    7:59 AM 03/27/2021   10:19 AM 02/18/2021    8:12 AM 11/14/2020    9:50 AM  PHQ 2/9 Scores  PHQ - 2 Score 0 0 0 0 0 0 0  PHQ- 9 Score 0 0 1 0 2 0 0    Diabetes: Current symptoms include: hyperglycemia. Known diabetic complications: none. Medication compliance: yes. Current diet: in general, a "healthy" diet  . Current exercise:  cardio and weight training . Home blood sugar records:  patient reports blood glucose readings have been higher since decreasing metformin from 1,000 mg BID to 500 mg BID. This was decreased due to her declining kidney function . Is she  on ACE inhibitor or angiotensin II receptor blocker? Yes. Is she on a statin? Yes.   Hypertension: She is elevated today. She has been checking her blood pressure at home and reports 110-120s/60-70s. States she was 116/68 yesterday.   Hypercholesteremia: taking lovastatin 40 mg daily.  Anxiety/Insomnia: controlled with Celexa and Trazodone.     12/25/2021    9:10 AM 08/19/2021    8:04 AM 05/21/2021    7:59 AM 03/27/2021   10:19 AM  GAD 7 : Generalized Anxiety Score  Nervous, Anxious, on Edge 0 0 0 0  Control/stop worrying 0 0 0 0  Worry too much - different things 0 0 0 0  Trouble relaxing 0 0 0 0  Restless 0 0 0 0  Easily annoyed or irritable 0 0 0 0  Afraid - awful might happen 0 0 0 0  Total  GAD 7 Score 0 0 0 0  Anxiety Difficulty Not difficult at all Not difficult at all Not difficult at all Not difficult at all    Review of Systems  Constitutional:  Negative for chills, fever, malaise/fatigue and weight loss.  HENT:  Negative for congestion, ear discharge, ear pain, nosebleeds, sinus pain, sore throat and tinnitus.   Eyes:  Negative for blurred vision, double vision, pain, discharge and redness.  Respiratory:  Negative for cough, shortness of breath and wheezing.   Cardiovascular:  Negative for chest pain, palpitations and leg swelling.  Gastrointestinal:  Negative for abdominal pain, constipation, diarrhea, heartburn, nausea and vomiting.  Genitourinary:  Negative for dysuria, frequency and urgency.  Musculoskeletal:  Negative for myalgias.  Skin:  Negative for rash.  Neurological:  Negative for dizziness, seizures, weakness and headaches.  Psychiatric/Behavioral:  Negative for depression, substance abuse and suicidal ideas. The patient is not nervous/anxious.      Current Outpatient Medications:  acetaminophen (TYLENOL) 325 MG tablet, Take 2 tablets (650 mg total) by mouth every 6 (six) hours as needed for mild pain (or Fever >/= 101)., Disp: 12 tablet, Rfl: 0   albuterol (PROVENTIL) (2.5 MG/3ML) 0.083% nebulizer solution, Take 3 mLs (2.5 mg total) by nebulization every 2 (two) hours as needed for wheezing or shortness of breath., Disp: 75 mL, Rfl: 12   albuterol (VENTOLIN HFA) 108 (90 Base) MCG/ACT inhaler, Inhale 2 puffs into the lungs every 4 (four) hours as needed for wheezing or shortness of breath., Disp: 18 g, Rfl: 2   amLODipine (NORVASC) 10 MG tablet, Take 1 tablet (10 mg total) by mouth daily., Disp: 90 tablet, Rfl: 1   citalopram (CELEXA) 40 MG tablet, Take 1 tablet (40 mg total) by mouth daily., Disp: 90 tablet, Rfl: 1   diphenhydrAMINE (BENADRYL) 25 MG tablet, Take 25 mg by mouth at bedtime as needed (for allergies.)., Disp: , Rfl:    fluticasone (FLONASE) 50  MCG/ACT nasal spray, Use 1 spray(s) in each nostril twice daily, Disp: 48 g, Rfl: 0   L-Lysine 500 MG CAPS, Take 1 capsule by mouth daily., Disp: , Rfl:    lisinopril-hydrochlorothiazide (ZESTORETIC) 20-25 MG tablet, Take 1 tablet by mouth daily., Disp: 90 tablet, Rfl: 1   lovastatin (MEVACOR) 40 MG tablet, Take 1 tablet (40 mg total) by mouth at bedtime., Disp: 90 tablet, Rfl: 1   metFORMIN (GLUCOPHAGE) 500 MG tablet, Take 1 tablet (500 mg total) by mouth 2 (two) times daily with a meal., Disp: 180 tablet, Rfl: 1   Semaglutide (RYBELSUS) 14 MG TABS, Take 14 mg by mouth daily., Disp: 90 tablet, Rfl: 1   traZODone (DESYREL) 50 MG tablet, TAKE 1 TABLET BY MOUTH AT BEDTIME AS NEEDED FOR SLEEP, Disp: 90 tablet, Rfl: 0  Allergies  Allergen Reactions   Farxiga [Dapagliflozin]     Yeast infections   Sulfa Antibiotics Hives and Itching    welts    Past Medical History:  Diagnosis Date   Anxiety    Arthritis    osteoarthritis   Asthma    Diabetes mellitus without complication ([place])    Hypercholesteremia    Hypertension     Past Surgical History:  Procedure Laterality Date   CATARACT EXTRACTION W/PHACO Left 08/18/2016   Procedure: CATARACT EXTRACTION PHACO AND INTRAOCULAR LENS PLACEMENT ([place]);  Surgeon: Rutherford Guys, MD;  Location: [facility];  Service: Ophthalmology;  Laterality: Left;  CDE: 6.05   CATARACT EXTRACTION W/PHACO Left 09/01/2016   Procedure: REPOSITIONING OF LEFT IOL LENS;  Surgeon: Rutherford Guys, MD;  Location: [facility];  Service: Ophthalmology;  Laterality: Left;   CATARACT EXTRACTION W/PHACO Right 10/06/2016   Procedure: CATARACT EXTRACTION PHACO AND INTRAOCULAR LENS PLACEMENT ([facility]);  Surgeon: Rutherford Guys, MD;  Location: [facility];  Service: Ophthalmology;  Laterality: Right;  CDE: 6.21    Family History  Problem Relation Age of Onset   Heart failure Mother    Hypertension Mother    Heart disease Mother    Stroke Father    Heart disease Father    Hypertension Father     Dementia Sister    Gallbladder disease Brother    Pancreatic cancer Brother    Heart attack Brother    Rheum arthritis Sister    Heart disease Sister    Cancer Sister        tonsils   Diabetes Sister    Diabetes Sister    Heart disease Sister    Rheum arthritis Sister  Social History   Socioeconomic History   Marital status: Married    Spouse name: Sonia Side   Number of children: 0   Years of education: Not on file   Highest education level: Not on file  Occupational History   Occupation: retired  Tobacco Use   Smoking status: Former    Packs/day: 0.25    Years: 20.00    Total pack years: 5.00    Types: Cigarettes    Quit date: 08/13/1980    Years since quitting: 41.3   Smokeless tobacco: Never  Substance and Sexual Activity   Alcohol use: No   Drug use: No   Sexual activity: Yes    Birth control/protection: Post-menopausal  Other Topics Concern   Not on file  Social History Narrative   Lives home with her husband. Works out at [employer] 60 min 3x per week and walks 30 minutes other days.   Sister lives in [place] Determinants of Health   Financial Resource Strain: Low Risk  (10/06/2021)   Overall Financial Resource Strain (CARDIA)    Difficulty of Paying Living Expenses: Not hard at all  Food Insecurity: No Food Insecurity (10/06/2021)   Hunger Vital Sign    Worried About Running Out of Food in the Last Year: Never true    Ran Out of Food in the Last Year: Never true  Transportation Needs: No Transportation Needs (10/06/2021)   PRAPARE - Hydrologist (Medical): No    Lack of Transportation (Non-Medical): No  Physical Activity: Sufficiently Active (10/06/2021)   Exercise Vital Sign    Days of Exercise per Week: 3 days    Minutes of Exercise per Session: 60 min  Stress: No Stress Concern Present (10/06/2021)   [place]    Feeling of Stress : Not at all  Social  Connections: [place] (10/06/2021)   Social Connection and Isolation Panel [NHANES]    Frequency of Communication with Friends and Family: More than three times a week    Frequency of Social Gatherings with Friends and Family: More than three times a week    Attends Religious Services: More than 4 times per year    Active Member of [employer] or Organizations: Yes    Attends Music therapist: More than 4 times per year    Marital Status: Married  Human resources officer Violence: Not At Risk (10/06/2021)   Humiliation, Afraid, Rape, and Kick questionnaire    Fear of Current or Ex-Partner: No    Emotionally Abused: No    Physically Abused: No    Sexually Abused: No      Objective:    BP (!) 144/74   Pulse 87   Temp (!) 97.3 F (36.3 C) (Temporal)   Ht '5\' 3"'  (1.6 m)   Wt 166 lb (75.3 kg)   SpO2 96%   BMI 29.41 kg/m   BP Readings from Last 3 Encounters:  12/25/21 (!) 144/74  08/19/21 116/62  05/21/21 (!) 152/75   Wt Readings from Last 3 Encounters:  12/25/21 166 lb (75.3 kg)  10/06/21 157 lb (71.2 kg)  08/19/21 157 lb (71.2 kg)    Physical Exam Vitals reviewed.  Constitutional:      General: She is not in acute distress.    Appearance: Normal appearance. She is overweight. She is not ill-appearing, toxic-appearing or diaphoretic.  HENT:     Head: Normocephalic and atraumatic.     Right  Ear: Tympanic membrane, ear canal and external ear normal. There is no impacted cerumen.     Left Ear: Tympanic membrane, ear canal and external ear normal. There is no impacted cerumen.     Nose: Nose normal. No congestion or rhinorrhea.     Mouth/Throat:     Mouth: Mucous membranes are moist.     Pharynx: Oropharynx is clear. No oropharyngeal exudate or posterior oropharyngeal erythema.  Eyes:     General: No scleral icterus.       Right eye: No discharge.        Left eye: No discharge.     Conjunctiva/sclera: Conjunctivae normal.     Pupils: Pupils are equal, round,  and reactive to light.  Cardiovascular:     Rate and Rhythm: Normal rate and regular rhythm.     Heart sounds: Normal heart sounds. No murmur heard.    No friction rub. No gallop.  Pulmonary:     Effort: Pulmonary effort is normal. No respiratory distress.     Breath sounds: Normal breath sounds. No stridor. No wheezing, rhonchi or rales.  Abdominal:     General: Abdomen is flat. Bowel sounds are normal. There is no distension.     Palpations: Abdomen is soft. There is no hepatomegaly, splenomegaly or mass.     Tenderness: There is no abdominal tenderness. There is no guarding or rebound.     Hernia: No hernia is present.  Musculoskeletal:        General: Normal range of motion.     Cervical back: Normal range of motion and neck supple. No rigidity. No muscular tenderness.  Lymphadenopathy:     Cervical: No cervical adenopathy.  Skin:    General: Skin is warm and dry.     Capillary Refill: Capillary refill takes less than 2 seconds.  Neurological:     General: No focal deficit present.     Mental Status: She is alert and oriented to person, place, and time. Mental status is at baseline.  Psychiatric:        Mood and Affect: Mood normal.        Behavior: Behavior normal.        Thought Content: Thought content normal.        Judgment: Judgment normal.     No results found for: "TSH" Lab Results  Component Value Date   WBC 7.9 08/19/2021   HGB 11.7 08/19/2021   HCT 36.3 08/19/2021   MCV 81 08/19/2021   PLT 403 08/19/2021   Lab Results  Component Value Date   NA 140 08/19/2021   K 4.3 08/19/2021   CO2 25 08/19/2021   GLUCOSE 154 (H) 08/19/2021   BUN 13 08/19/2021   CREATININE 1.24 (H) 08/19/2021   BILITOT 0.4 08/19/2021   ALKPHOS 73 08/19/2021   AST 10 08/19/2021   ALT 6 08/19/2021   PROT 6.4 08/19/2021   ALBUMIN 4.2 08/19/2021   CALCIUM 9.6 08/19/2021   ANIONGAP 10 03/19/2021   EGFR 47 (L) 08/19/2021   Lab Results  Component Value Date   CHOL 157 08/19/2021    Lab Results  Component Value Date   HDL 71 08/19/2021   Lab Results  Component Value Date   LDLCALC 63 08/19/2021   Lab Results  Component Value Date   TRIG 137 08/19/2021   Lab Results  Component Value Date   CHOLHDL 2.2 08/19/2021   Lab Results  Component Value Date   HGBA1C 5.8 (H) 08/19/2021

## 2021-12-26 LAB — CMP14+EGFR
ALT: 6 IU/L (ref 0–32)
AST: 9 IU/L (ref 0–40)
Albumin/Globulin Ratio: 2.3 — ABNORMAL HIGH (ref 1.2–2.2)
Albumin: 4.6 g/dL (ref 3.8–4.8)
Alkaline Phosphatase: 81 IU/L (ref 44–121)
BUN/Creatinine Ratio: 10 — ABNORMAL LOW (ref 12–28)
BUN: 13 mg/dL (ref 8–27)
Bilirubin Total: 0.6 mg/dL (ref 0.0–1.2)
CO2: 23 mmol/L (ref 20–29)
Calcium: 9.6 mg/dL (ref 8.7–10.3)
Chloride: 97 mmol/L (ref 96–106)
Creatinine, Ser: 1.28 mg/dL — ABNORMAL HIGH (ref 0.57–1.00)
Globulin, Total: 2 g/dL (ref 1.5–4.5)
Glucose: 167 mg/dL — ABNORMAL HIGH (ref 70–99)
Potassium: 4.5 mmol/L (ref 3.5–5.2)
Sodium: 137 mmol/L (ref 134–144)
Total Protein: 6.6 g/dL (ref 6.0–8.5)
eGFR: 45 mL/min/{1.73_m2} — ABNORMAL LOW (ref >59)

## 2021-12-26 LAB — CBC WITH DIFFERENTIAL/PLATELET
Basophils Absolute: 0.1 10*3/uL (ref 0.0–0.2)
Basos: 1 %
EOS (ABSOLUTE): 0.5 10*3/uL — ABNORMAL HIGH (ref 0.0–0.4)
Eos: 6 %
Hematocrit: 36.6 % (ref 34.0–46.6)
Hemoglobin: 11.8 g/dL (ref 11.1–15.9)
Immature Grans (Abs): 0 10*3/uL (ref 0.0–0.1)
Immature Granulocytes: 0 %
Lymphocytes Absolute: 2.1 10*3/uL (ref 0.7–3.1)
Lymphs: 27 %
MCH: 28 pg (ref 26.6–33.0)
MCHC: 32.2 g/dL (ref 31.5–35.7)
MCV: 87 fL (ref 79–97)
Monocytes Absolute: 0.5 10*3/uL (ref 0.1–0.9)
Monocytes: 7 %
Neutrophils Absolute: 4.5 10*3/uL (ref 1.4–7.0)
Neutrophils: 59 %
Platelets: 378 10*3/uL (ref 150–450)
RBC: 4.21 x10E6/uL (ref 3.77–5.28)
RDW: 13.3 % (ref 11.7–15.4)
WBC: 7.6 10*3/uL (ref 3.4–10.8)

## 2021-12-26 LAB — LIPID PANEL
Chol/HDL Ratio: 2.4 ratio (ref 0.0–4.4)
Cholesterol, Total: 167 mg/dL (ref 100–199)
HDL: 70 mg/dL (ref >39)
LDL Chol Calc (NIH): 76 mg/dL (ref 0–99)
Triglycerides: 123 mg/dL (ref 0–149)
VLDL Cholesterol Cal: 21 mg/dL (ref 5–40)

## 2022-01-09 DIAGNOSIS — Z1231 Encounter for screening mammogram for malignant neoplasm of breast: Secondary | ICD-10-CM | POA: Diagnosis not present

## 2022-01-16 ENCOUNTER — Encounter: Payer: Self-pay | Admitting: Family Medicine

## 2022-01-16 DIAGNOSIS — B3731 Acute candidiasis of vulva and vagina: Secondary | ICD-10-CM

## 2022-01-16 DIAGNOSIS — E1165 Type 2 diabetes mellitus with hyperglycemia: Secondary | ICD-10-CM

## 2022-01-19 MED ORDER — GLIPIZIDE ER 5 MG PO TB24: 5.0000 mg | ORAL_TABLET | Freq: Every day | ORAL | 1 refills | Status: DC

## 2022-01-19 MED ORDER — FLUCONAZOLE 150 MG PO TABS: 150.0000 mg | ORAL_TABLET | Freq: Once | ORAL | 0 refills | Status: AC

## 2022-01-28 ENCOUNTER — Ambulatory Visit: Payer: Medicare Other | Admitting: Pharmacist

## 2022-01-28 DIAGNOSIS — N1831 Chronic kidney disease, stage 3a: Secondary | ICD-10-CM

## 2022-01-28 DIAGNOSIS — E1165 Type 2 diabetes mellitus with hyperglycemia: Secondary | ICD-10-CM

## 2022-01-28 NOTE — Progress Notes (Signed)
Chronic Care Management Pharmacy Note  01/28/2022 Name:  Jeanne Medina MRN:  [id number] DOB:  04-Jan-1951  Summary:  Diabetes: Goal on Track (progressing): YES. A1C increased to 7.6%;  Current treatment:RYBELSUS 14MG, METFORMIN 1G DAILY (GFR decreased to 40s);  Intolerances-patient has tried Sales executive, however patient continues to have yeast infections  Denies personal and family history of Medullary thyroid cancer (MTC) CONTINUE DECREASE METFORMIN TO 500MG (1 TAB) TWICE DAILY--A1C CONTROLLED & WE ARE WATCHING GFR 60-->47 (CKD3A) Added back glipizide to see if any benefit CONTINUE RYBELSUS 20m daily--consider transitioning to Ozempic for additional glycemic control TOLERATING WELL; DENIES SIDE EFFECTS APPLICATION APPROVED FOR NOVO N[place]PATIENT ASSISTANCE PROGRAM Current glucose readings: fasting glucose: <150, post prandial glucose: N/A DENIES hypoglycemic/hyperglycemic symptoms Discussed meal planning options and Plate method for healthy eating Avoid sugary drinks and desserts Incorporate balanced protein, non starchy veggies, 1 serving of carbohydrate with each meal Increase water intake Increase physical activity as able Current exercise: N/A Counseled on MEDICATIONS--PURPOSE & SIDE EFFECTS Collaborated with PCP--DECREASED METFORMIN Assessed patient finances. APPLICATION APPROVED FOR RYBELSUS NOVO NPaskentaPATIENT ASSISTANCE-->REFILLS SENT TODAY VIA FAX  Hyperlipidemia:  Goal on Track (progressing): YES. controlled; current treatment: LOVASTATIN;  Medications previously tried: N/A  Current dietary patterns: ENCOURAGED HEART HEALTHY DIET, DECREASE SUGAR AND FATTY FOODS TO HELP WITH TRIGLYCERIDES Counseled on DIET-HEART HEALTHY   Lipid Panel     Component Value Date/Time   CHOL 157 08/19/2021 0818   TRIG 137 08/19/2021 0818   HDL 71 08/19/2021 0818   CHOLHDL 2.2 08/19/2021 0818   LDLCALC 63 08/19/2021 0818   LABVLDL 23 08/19/2021 0818   Patient  Goals/Self-Care Activities patient will:  - take medications as prescribed as evidenced by patient report and record review check glucose DAILY FASTING, document, and provide at future appointments collaborate with provider on medication access solutions target a minimum of 150 minutes of moderate intensity exercise weekly engage in dietary modifications by FOLLOWING HEART HEALTHY DIET  Subjective: Jeanne LUDKEis an 71y.o. year old female who is a primary patient of Rakes, LConnye Burkitt FNP.  The CCM team was consulted for assistance with disease management and care coordination needs.    Engaged with patient by telephone for follow up visit in response to provider referral for pharmacy case management and/or care coordination services.   Consent to Services:  The patient was given information about Chronic Care Management services, agreed to services, and gave verbal consent prior to initiation of services.  Please see initial visit note for detailed documentation.   Patient Care Team: RBaruch Gouty FNP as PCP - General (Family Medicine) [facility]) LHarlen Labs MD as Referring Physician (Optometry)  Objective:  Lab Results  Component Value Date   CREATININE 1.28 (H) 12/25/2021   CREATININE 1.24 (H) 08/19/2021   CREATININE 1.00 03/27/2021    Lab Results  Component Value Date   HGBA1C 7.6 (H) 12/25/2021   Last diabetic Eye exam:  Lab Results  Component Value Date/Time   HMDIABEYEEXA No Retinopathy 07/30/2020 12:00 AM    Last diabetic Foot exam: No results found for: "HMDIABFOOTEX"      Component Value Date/Time   CHOL 167 12/25/2021 0917   TRIG 123 12/25/2021 0917   HDL 70 12/25/2021 0917   CHOLHDL 2.4 12/25/2021 0917   LDLCALC 76 12/25/2021 0917       Latest Ref Rng & Units 12/25/2021    9:17 AM 08/19/2021    8:18  AM 03/27/2021   11:21 AM  Hepatic Function  Total Protein 6.0 - 8.5 g/dL 6.6  6.4  6.2   Albumin 3.8 - 4.8 g/dL 4.6  4.2   4.3   AST 0 - 40 IU/L '9  10  8   ' ALT 0 - 32 IU/L '6  6  6   ' Alk Phosphatase 44 - 121 IU/L 81  73  75   Total Bilirubin 0.0 - 1.2 mg/dL 0.6  0.4  0.5     No results found for: "TSH", "FREET4"     Latest Ref Rng & Units 12/25/2021    9:17 AM 08/19/2021    8:18 AM 03/27/2021   11:21 AM  CBC  WBC 3.4 - 10.8 x10E3/uL 7.6  7.9  9.9   Hemoglobin 11.1 - 15.9 g/dL 11.8  11.7  11.5   Hematocrit 34.0 - 46.6 % 36.6  36.3  34.7   Platelets 150 - 450 x10E3/uL 378  403  442     No results found for: "VD25OH"  Clinical ASCVD: No  The 10-year ASCVD risk score (Arnett DK, et al., 2019) is: 28.8%   Values used to calculate the score:     Age: 3 years     Sex: Female     Is Non-Hispanic African American: No     Diabetic: Yes     Tobacco smoker: No     Systolic Blood Pressure: 244 mmHg     Is BP treated: Yes     HDL Cholesterol: 70 mg/dL     Total Cholesterol: 167 mg/dL    Other: (CHADS2VASc if Afib, PHQ9 if depression, MMRC or CAT for COPD, ACT, DEXA)  Social History   Tobacco Use  Smoking Status Former   Packs/day: 0.25   Years: 20.00   Total pack years: 5.00   Types: Cigarettes   Quit date: 08/13/1980   Years since quitting: 41.5  Smokeless Tobacco Never   BP Readings from Last 3 Encounters:  12/25/21 (!) 144/74  08/19/21 116/62  05/21/21 (!) 152/75   Pulse Readings from Last 3 Encounters:  12/25/21 87  08/19/21 91  05/21/21 93   Wt Readings from Last 3 Encounters:  12/25/21 166 lb (75.3 kg)  10/06/21 157 lb (71.2 kg)  08/19/21 157 lb (71.2 kg)    Assessment: Review of patient past medical history, allergies, medications, health status, including review of consultants reports, laboratory and other test data, was performed as part of comprehensive evaluation and provision of chronic care management services.   SDOH:  (Social Determinants of Health) assessments and interventions performed:  SDOH Interventions    Flowsheet Row Clinical Support from 10/06/2021 in [place] Visit from 08/19/2021 in [place] Visit from 03/27/2021 in [place] Visit from 05/16/2020 in [place] Interventions      Food Insecurity Interventions Intervention Not Indicated -- -- --  Housing Interventions Intervention Not Indicated -- -- --  Transportation Interventions Intervention Not Indicated -- -- --  Depression Interventions/Treatment  PHQ2-9 Score <4 Follow-up Not Indicated PHQ2-9 Score <4 Follow-up Not Indicated PHQ2-9 Score <4 Follow-up Not Indicated Currently on Treatment  Financial Strain Interventions Intervention Not Indicated -- -- --  Physical Activity Interventions Intervention Not Indicated -- -- --  Stress Interventions Intervention Not Indicated -- -- --  Social Connections Interventions Intervention Not Indicated -- -- --       CCM Care Plan  Allergies  Allergen Reactions   Farxiga [Dapagliflozin]     Yeast infections   Sulfa Antibiotics Hives and Itching    welts    Medications Reviewed Today     Reviewed by Lavera Guise, [facility] (Pharmacist) on 01/28/22 at 1434  Med List Status: <None>   Medication Order Taking? Sig Documenting Provider Last Dose Status Informant  acetaminophen (TYLENOL) 325 MG tablet [id number] No Take 2 tablets (650 mg total) by mouth every 6 (six) hours as needed for mild pain (or Fever >/= 101). Roxan Hockey, MD Taking Active   albuterol (PROVENTIL) (2.5 MG/3ML) 0.083% nebulizer solution [id number] No Take 3 mLs (2.5 mg total) by nebulization every 2 (two) hours as needed for wheezing or shortness of breath. Roxan Hockey, MD Taking Active   albuterol (VENTOLIN HFA) 108 (90 Base) MCG/ACT inhaler [id number] No Inhale 2 puffs into the lungs every 4 (four) hours as needed for wheezing or shortness of breath. Roxan Hockey, MD Taking Active   amLODipine (NORVASC) 10 MG tablet [id number] No Take 1 tablet (10 mg  total) by mouth daily. Loman Brooklyn, FNP Taking Active   citalopram (CELEXA) 40 MG tablet [id number] No Take 1 tablet (40 mg total) by mouth daily. Loman Brooklyn, FNP Taking Active   diphenhydrAMINE (BENADRYL) 25 MG tablet [id number] No Take 25 mg by mouth at bedtime as needed (for allergies.). [provider] Taking Active Self  fluticasone (FLONASE) 50 MCG/ACT nasal spray [id number] No Use 1 spray(s) in each nostril twice daily Hendricks Limes F, FNP Taking Active   glipiZIDE (GLUCOTROL XL) 5 MG 24 hr tablet [id number]  Take 1 tablet (5 mg total) by mouth daily with breakfast. Loman Brooklyn, FNP  Active   L-Lysine 500 MG CAPS [id number] No Take 1 capsule by mouth daily. [provider] Taking Active Self  lisinopril-hydrochlorothiazide (ZESTORETIC) 20-25 MG tablet [id number] No Take 1 tablet by mouth daily. Loman Brooklyn, FNP Taking Active   lovastatin (MEVACOR) 40 MG tablet [id number] No Take 1 tablet (40 mg total) by mouth at bedtime. Loman Brooklyn, FNP Taking Active   metFORMIN (GLUCOPHAGE) 500 MG tablet [id number] No Take 1 tablet (500 mg total) by mouth 2 (two) times daily with a meal. Loman Brooklyn, FNP Taking Active   Semaglutide (RYBELSUS) 14 MG TABS [id number] No Take 14 mg by mouth daily. Loman Brooklyn, FNP Taking Active   traZODone (DESYREL) 50 MG tablet [id number] No TAKE 1 TABLET BY MOUTH AT BEDTIME AS NEEDED FOR SLEEP Loman Brooklyn, FNP Taking Active             Patient Active Problem List   Diagnosis Date Noted   Stage 3a chronic kidney disease ([place]) 12/25/2021   Type 2 diabetes mellitus with hyperglycemia, without long-term current use of insulin ([place]) 05/16/2020   Difficulty sleeping 05/16/2020   History of COVID-19 05/16/2020   Atrophic vaginitis 11/04/2018   Mild intermittent asthma without complication 80/16/5537   Essential hypertension    Hypercholesteremia    Anxiety    Arthritis     Immunization History  Administered Date(s)  Administered   Influenza Split 02/10/2017   Influenza,inj,Quad PF,6+ Mos 01/26/2019   Influenza,inj,quad, With Preservative 01/28/2018   Influenza-Unspecified 03/14/2007, 02/09/2014, 02/08/2015, 01/28/2018, 02/02/2020, 02/13/2021   Pneumococcal Conjugate-13 02/09/2014   Pneumococcal Polysaccharide-23 05/14/2015   Td 05/09/1998   Tdap 06/11/2017   Zoster Recombinat (Shingrix) 02/02/2020, 07/11/2020   Zoster, Live 12/02/2012    Conditions to be addressed/monitored: DMII and CKD Stage 3a  Care Plan : PHARMD MEDICATION MANAGEMENT  Updates made by Lavera Guise, RPH since 02/06/2022 12:00 AM     Problem: DISEASE PROGRESSION PREVENTION      Long-Range Goal: T2DM   Recent Progress: On track  Priority: High  Note:   Current Barriers:  Unable to independently afford treatment regimen Unable to maintain control of T2DM -patient ran out of medicine due to cost  Pharmacist Clinical Goal(s):  patient will verbalize ability to afford treatment regimen maintain control of T2DM as evidenced by GOAL A1C<7%, IMPROVED GLYCEMIC  CONTROL  through collaboration with PharmD and provider.    Interventions: 1:1 collaboration with Loman Brooklyn, FNP regarding development and update of comprehensive plan of care as evidenced by provider attestation and co-signature Inter-disciplinary care team collaboration (see longitudinal plan of care) Comprehensive medication review performed; medication list updated in electronic medical record  Diabetes: Goal on Track (progressing): YES. A1C increased to 7.6%;  Current treatment:RYBELSUS 14MG, METFORMIN 1G DAILY (GFR decreased to 40s);  Intolerances-patient has tried Sales executive, however patient continues to have yeast infections  Denies personal and family history of Medullary thyroid cancer (MTC) CONTINUE DECREASE METFORMIN TO 500MG (1 TAB) TWICE DAILY--A1C CONTROLLED & WE ARE WATCHING GFR 60-->47 (CKD3A) Added back glipizide to see if any  benefit CONTINUE RYBELSUS 21m daily--consider transitioning to Ozempic for additional glycemic control TOLERATING WELL; DENIES SIDE EFFECTS APPLICATION APPROVED FOR NOVO NTibbiePATIENT ASSISTANCE PROGRAM Current glucose readings: fasting glucose: <150, post prandial glucose: N/A DENIES hypoglycemic/hyperglycemic symptoms Discussed meal planning options and Plate method for healthy eating Avoid sugary drinks and desserts Incorporate balanced protein, non starchy veggies, 1 serving of carbohydrate with each meal Increase water intake Increase physical activity as able Current exercise: N/A Counseled on MEDICATIONS--PURPOSE & SIDE EFFECTS Collaborated with PCP--DECREASED METFORMIN Assessed patient finances. APPLICATION APPROVED FOR RYBELSUS NOVO NMountain GreenPATIENT ASSISTANCE-->REFILLS SENT TODAY VIA FAX  Hyperlipidemia:  Goal on Track (progressing): YES. controlled; current treatment: LOVASTATIN;  Medications previously tried: N/A  Current dietary patterns: ENCOURAGED HEART HEALTHY DIET, DECREASE SUGAR AND FATTY FOODS TO HELP WITH TRIGLYCERIDES Counseled on DIET-HEART HEALTHY   Lipid Panel     Component Value Date/Time   CHOL 157 08/19/2021 0818   TRIG 137 08/19/2021 0818   HDL 71 08/19/2021 0818   CHOLHDL 2.2 08/19/2021 0818   LDLCALC 63 08/19/2021 0818   LABVLDL 23 08/19/2021 0818    Patient Goals/Self-Care Activities patient will:  - take medications as prescribed as evidenced by patient report and record review check glucose DAILY FASTING, document, and provide at future appointments collaborate with provider on medication access solutions target a minimum of 150 minutes of moderate intensity exercise weekly engage in dietary modifications by FOLLOWING HEART HEALTHY DIET      Medication Assistance:  rybelsus obtained through novo nordisk medication assistance program.  Enrollment ends 04/26/2022  [facility] [place] [address]Phone: [phone]: [phone]    JRegina Eck PharmD, BCPS Clinical Pharmacist, [place] Phone [phone]

## 2022-02-04 ENCOUNTER — Other Ambulatory Visit: Payer: Self-pay | Admitting: Family Medicine

## 2022-02-04 DIAGNOSIS — B3731 Acute candidiasis of vulva and vagina: Secondary | ICD-10-CM

## 2022-02-05 ENCOUNTER — Other Ambulatory Visit: Payer: Self-pay | Admitting: Family Medicine

## 2022-02-05 DIAGNOSIS — G479 Sleep disorder, unspecified: Secondary | ICD-10-CM

## 2022-02-06 NOTE — Patient Instructions (Addendum)
Visit Information  Following are the goals we discussed today:  Current Barriers:  Unable to independently afford treatment regimen Unable to maintain control of T2DM-patient ran out of medicine due to cost  Pharmacist Clinical Goal(s):  patient will verbalize ability to afford treatment regimen maintain control of T2DM as evidenced by GOAL A1C<7%, IMPROVED GLYCEMIC  CONTROL  through collaboration with PharmD and provider.    Interventions: 1:1 collaboration with Loman Brooklyn, FNP regarding development and update of comprehensive plan of care as evidenced by provider attestation and co-signature Inter-disciplinary care team collaboration (see longitudinal plan of care) Comprehensive medication review performed; medication list updated in electronic medical record  Diabetes: Goal on Track (progressing): YES. A1C increased to 7.6%;  Current treatment:RYBELSUS 14MG , METFORMIN 1G DAILY (GFR decreased to 40s);  Intolerances-patient has tried Sales executive, however patient continues to have yeast infections  Denies personal and family history of Medullary thyroid cancer (MTC) CONTINUE DECREASE METFORMIN TO 500MG  (1 TAB) TWICE DAILY--A1C CONTROLLED & WE ARE WATCHING GFR 60-->47 (CKD3A) Added back glipizide to see if any benefit CONTINUE RYBELSUS 14mg  daily--consider transitioning to Ozempic for additional glycemic control TOLERATING WELL; DENIES SIDE EFFECTS APPLICATION APPROVED FOR NOVO [place] PATIENT ASSISTANCE PROGRAM Current glucose readings: fasting glucose: <150, post prandial glucose: N/A DENIES hypoglycemic/hyperglycemic symptoms Discussed meal planning options and Plate method for healthy eating Avoid sugary drinks and desserts Incorporate balanced protein, non starchy veggies, 1 serving of carbohydrate with each meal Increase water intake Increase physical activity as able Current exercise: N/A Counseled on MEDICATIONS--PURPOSE & SIDE EFFECTS Collaborated with  PCP--DECREASED METFORMIN Assessed patient finances. APPLICATION APPROVED FOR RYBELSUS NOVO [place] PATIENT ASSISTANCE-->REFILLS SENT TODAY VIA FAX  Hyperlipidemia:  Goal on Track (progressing): YES. controlled; current treatment: LOVASTATIN;  Medications previously tried: N/A  Current dietary patterns: ENCOURAGED HEART HEALTHY DIET, DECREASE SUGAR AND FATTY FOODS TO HELP WITH TRIGLYCERIDES Counseled on DIET-HEART HEALTHY   Lipid Panel     Component Value Date/Time   CHOL 157 08/19/2021 0818   TRIG 137 08/19/2021 0818   HDL 71 08/19/2021 0818   CHOLHDL 2.2 08/19/2021 0818   LDLCALC 63 08/19/2021 0818   LABVLDL 23 08/19/2021 0818     Patient Goals/Self-Care Activities patient will:  - take medications as prescribed as evidenced by patient report and record review check glucose DAILY FASTING, document, and provide at future appointments collaborate with provider on medication access solutions target a minimum of 150 minutes of moderate intensity exercise weekly engage in dietary modifications by FOLLOWING HEART HEALTHY DIET   Plan: Telephone follow up appointment with care management team member scheduled for:  03/2022  Signature Regina Eck, PharmD, BCPS Clinical Pharmacist, [place]  [place] Phone [phone]  Please call the care guide team at [phone] if you need to cancel or reschedule your appointment.   The patient verbalized understanding of instructions, educational materials, and care plan provided today and DECLINED offer to receive copy of patient instructions, educational materials, and care plan.

## 2022-02-06 NOTE — Telephone Encounter (Signed)
Last office visit 12/25/21 Last refill 11/12/21, #90, no refills

## 2022-02-26 ENCOUNTER — Telehealth: Payer: Self-pay

## 2022-02-26 NOTE — Telephone Encounter (Signed)
Mailed Novo Nordisk renewal application to patient home.   Alece Koppel L. CPhT Rx Patient Advocate  

## 2022-03-16 NOTE — Telephone Encounter (Signed)
Pt returned application (with proof of income)  Faxed provider portion to office.

## 2022-03-27 ENCOUNTER — Ambulatory Visit: Payer: Medicare Other | Admitting: Family Medicine

## 2022-04-01 ENCOUNTER — Encounter: Payer: Self-pay | Admitting: Pharmacist

## 2022-04-02 IMAGING — DX DG CHEST 1V PORT
1 series · 1 of 1 positions shown · non-contrast
Comparison: None.

CLINICAL DATA: 69-year-old female with cough and congestion for 2
weeks. Started on antibiotics and steroids.

EXAM:
PORTABLE CHEST 1 VIEW

[chest ap]
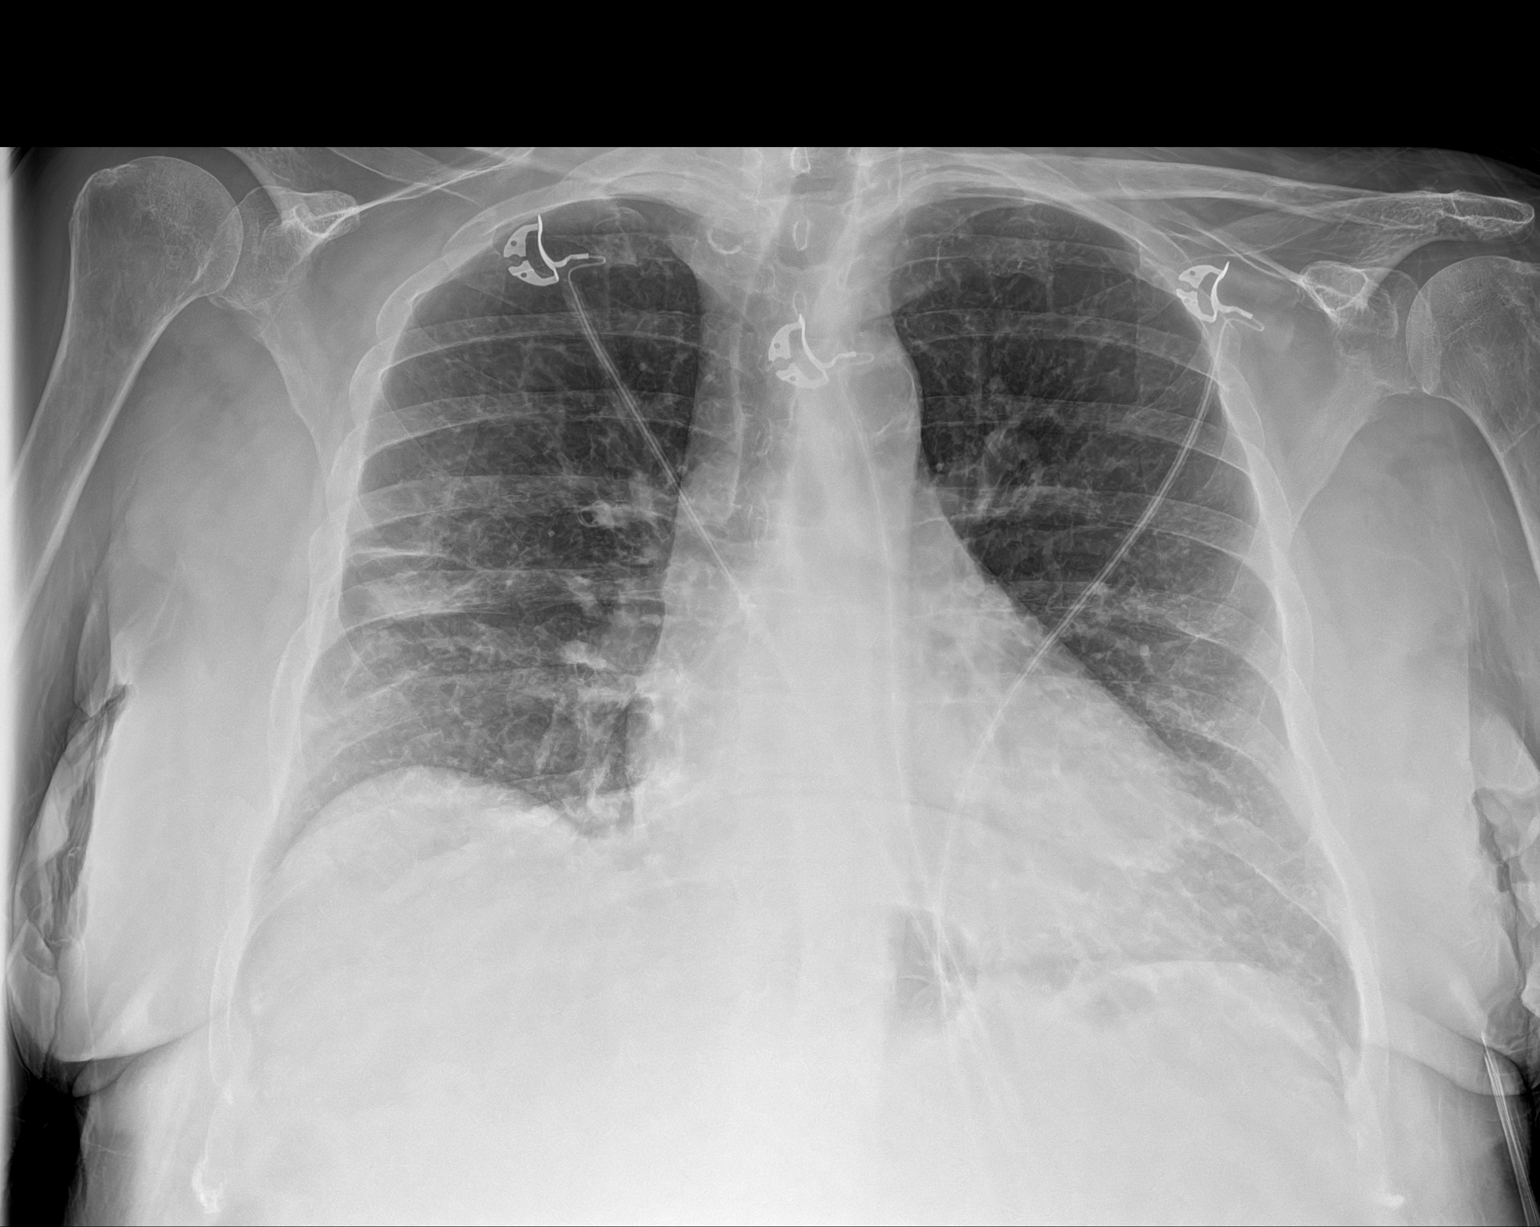

[1 of 1 positions shown; findings below may reference images not displayed]

FINDINGS: Portable AP upright view at 7907 hours. Low normal lung volumes.
Normal cardiac size and mediastinal contours. Visualized tracheal
air column is within normal limits. Streaky opacity in the mid right
lung including along the minor fissure. But elsewhere Allowing for
portable technique the lungs are clear. No pleural effusion. No
pulmonary edema. No acute osseous abnormality identified. Paucity of
bowel gas in the upper abdomen.
IMPRESSION: Streaky opacity in the mid right lung with no prior imaging.
Nonspecific, but suspicious for bronchopneumonia in this setting. No
pleural effusion.

## 2022-04-03 ENCOUNTER — Encounter: Payer: Self-pay | Admitting: Family Medicine

## 2022-04-03 ENCOUNTER — Ambulatory Visit (INDEPENDENT_AMBULATORY_CARE_PROVIDER_SITE_OTHER): Payer: Medicare Other | Admitting: Family Medicine

## 2022-04-03 VITALS — BP 129/73 | HR 93 | Temp 98.2°F | Ht 63.0 in | Wt 172.2 lb

## 2022-04-03 DIAGNOSIS — E1159 Type 2 diabetes mellitus with other circulatory complications: Secondary | ICD-10-CM | POA: Diagnosis not present

## 2022-04-03 DIAGNOSIS — N183 Chronic kidney disease, stage 3 unspecified: Secondary | ICD-10-CM | POA: Diagnosis not present

## 2022-04-03 DIAGNOSIS — E1169 Type 2 diabetes mellitus with other specified complication: Secondary | ICD-10-CM

## 2022-04-03 DIAGNOSIS — E1122 Type 2 diabetes mellitus with diabetic chronic kidney disease: Secondary | ICD-10-CM

## 2022-04-03 DIAGNOSIS — E785 Hyperlipidemia, unspecified: Secondary | ICD-10-CM

## 2022-04-03 DIAGNOSIS — I152 Hypertension secondary to endocrine disorders: Secondary | ICD-10-CM

## 2022-04-03 DIAGNOSIS — E1165 Type 2 diabetes mellitus with hyperglycemia: Secondary | ICD-10-CM

## 2022-04-03 LAB — BAYER DCA HB A1C WAIVED: HB A1C (BAYER DCA - WAIVED): 7.7 % — ABNORMAL HIGH (ref 4.8–5.6)

## 2022-04-03 MED ORDER — METFORMIN HCL 500 MG PO TABS: 500.0000 mg | ORAL_TABLET | Freq: Two times a day (BID) | ORAL | 3 refills | Status: DC

## 2022-04-03 MED ORDER — GLIPIZIDE 10 MG PO TABS: 10.0000 mg | ORAL_TABLET | Freq: Every day | ORAL | 3 refills | Status: DC

## 2022-04-03 NOTE — Patient Instructions (Addendum)

## 2022-04-03 NOTE — Progress Notes (Signed)
Subjective:  Patient ID: LUV MISH, female    DOB: 04-01-1951, 71 y.o.   MRN: [id number]  Patient Care Team: Baruch Gouty, FNP as PCP - General (Family Medicine) Lavera Guise, [facility] (Pharmacist) Harlen Labs, MD as Referring Physician (Optometry)   Chief Complaint:  Diabetes   HPI: Jeanne Medina is a 71 y.o. female presenting on 04/03/2022 for Diabetes   Pt presents today to establish care with new PCP and management of chronic medical conditions.    1. Type 2 diabetes mellitus with hyperglycemia, without long-term current use of insulin (HCC) Was started on glipizide at last visit due to intolerance to [place]. She states she has been doing well on this medication. States blood sugars have been trending down but still has some readings above 180. No polyuria, polyphagia, or polydipsia. Denies neuropathy symptoms.   2. CKD stage 3 due to type 2 diabetes mellitus ([place]) Unable to tolerate Farxiga due to side effects or recurrent yeast infections. Is on ACEi. Denies changes in urinary output. No swelling, weakness, confusion, or fatigue.   3. Hypertension associated with type 2 diabetes mellitus ([place]) Compliant with medications without associated side effects. Denies headaches, chest pain, visual changes, leg swelling, shortness of breath, weakness or confusion.   4. Hyperlipidemia associated with type 2 diabetes mellitus ([place]) On lovastatin and tolerating well. Does not follow a strict diet or exercise routine but is very active throughout the day.      Relevant past medical, surgical, family, and social history reviewed and updated as indicated.  Allergies and medications reviewed and updated. Data reviewed: Chart in Epic.   Past Medical History:  Diagnosis Date   Anxiety    Arthritis    osteoarthritis   Asthma    CKD (chronic kidney disease) stage 3, GFR 30-59 ml/min (HCC)    Diabetes mellitus without complication ([place])    Hypercholesteremia     Hypertension     Past Surgical History:  Procedure Laterality Date   CATARACT EXTRACTION W/PHACO Left 08/18/2016   Procedure: CATARACT EXTRACTION PHACO AND INTRAOCULAR LENS PLACEMENT ([place]);  Surgeon: Rutherford Guys, MD;  Location: [facility];  Service: Ophthalmology;  Laterality: Left;  CDE: 6.05   CATARACT EXTRACTION W/PHACO Left 09/01/2016   Procedure: REPOSITIONING OF LEFT IOL LENS;  Surgeon: Rutherford Guys, MD;  Location: [facility];  Service: Ophthalmology;  Laterality: Left;   CATARACT EXTRACTION W/PHACO Right 10/06/2016   Procedure: CATARACT EXTRACTION PHACO AND INTRAOCULAR LENS PLACEMENT (IOC);  Surgeon: Rutherford Guys, MD;  Location: [facility];  Service: Ophthalmology;  Laterality: Right;  CDE: 6.21    Social History   Socioeconomic History   Marital status: Married    Spouse name: Sonia Side   Number of children: 0   Years of education: Not on file   Highest education level: Not on file  Occupational History   Occupation: retired  Tobacco Use   Smoking status: Former    Packs/day: 0.25    Years: 20.00    Total pack years: 5.00    Types: Cigarettes    Quit date: 08/13/1980    Years since quitting: 41.6   Smokeless tobacco: Never  Substance and Sexual Activity   Alcohol use: No   Drug use: No   Sexual activity: Yes    Birth control/protection: Post-menopausal  Other Topics Concern   Not on file  Social History Narrative   Lives home with her husband. Works out at [employer] 60 min 3x per week  and walks 30 minutes other days.   Sister lives in [place] Determinants of Health   Financial Resource Strain: Low Risk  (10/06/2021)   Overall Financial Resource Strain (CARDIA)    Difficulty of Paying Living Expenses: Not hard at all  Food Insecurity: No Food Insecurity (10/06/2021)   Hunger Vital Sign    Worried About Running Out of Food in the Last Year: Never true    Ran Out of Food in the Last Year: Never true  Transportation Needs: No Transportation Needs (10/06/2021)   PRAPARE -  Hydrologist (Medical): No    Lack of Transportation (Non-Medical): No  Physical Activity: Sufficiently Active (10/06/2021)   Exercise Vital Sign    Days of Exercise per Week: 3 days    Minutes of Exercise per Session: 60 min  Stress: No Stress Concern Present (10/06/2021)   [place]    Feeling of Stress : Not at all  Social Connections: [place] (10/06/2021)   Social Connection and Isolation Panel [NHANES]    Frequency of Communication with Friends and Family: More than three times a week    Frequency of Social Gatherings with Friends and Family: More than three times a week    Attends Religious Services: More than 4 times per year    Active Member of [employer] or Organizations: Yes    Attends Archivist Meetings: More than 4 times per year    Marital Status: Married  Human resources officer Violence: Not At Risk (10/06/2021)   Humiliation, Afraid, Rape, and Kick questionnaire    Fear of Current or Ex-Partner: No    Emotionally Abused: No    Physically Abused: No    Sexually Abused: No    Outpatient Encounter Medications as of 04/03/2022  Medication Sig   acetaminophen (TYLENOL) 325 MG tablet Take 2 tablets (650 mg total) by mouth every 6 (six) hours as needed for mild pain (or Fever >/= 101).   albuterol (PROVENTIL) (2.5 MG/3ML) 0.083% nebulizer solution Take 3 mLs (2.5 mg total) by nebulization every 2 (two) hours as needed for wheezing or shortness of breath.   albuterol (VENTOLIN HFA) 108 (90 Base) MCG/ACT inhaler Inhale 2 puffs into the lungs every 4 (four) hours as needed for wheezing or shortness of breath.   amLODipine (NORVASC) 10 MG tablet Take 1 tablet (10 mg total) by mouth daily.   citalopram (CELEXA) 40 MG tablet Take 1 tablet (40 mg total) by mouth daily.   diphenhydrAMINE (BENADRYL) 25 MG tablet Take 25 mg by mouth at bedtime as needed (for allergies.).   fluticasone  (FLONASE) 50 MCG/ACT nasal spray Use 1 spray(s) in each nostril twice daily   glipiZIDE (GLUCOTROL) 10 MG tablet Take 1 tablet (10 mg total) by mouth daily before breakfast.   L-Lysine 500 MG CAPS Take 1 capsule by mouth daily.   lisinopril-hydrochlorothiazide (ZESTORETIC) 20-25 MG tablet Take 1 tablet by mouth daily.   lovastatin (MEVACOR) 40 MG tablet Take 1 tablet (40 mg total) by mouth at bedtime.   Semaglutide (RYBELSUS) 14 MG TABS Take 14 mg by mouth daily.   traZODone (DESYREL) 50 MG tablet TAKE 1 TABLET BY MOUTH AT BEDTIME AS NEEDED FOR SLEEP   [DISCONTINUED] glipiZIDE (GLUCOTROL XL) 5 MG 24 hr tablet Take 1 tablet (5 mg total) by mouth daily with breakfast.   [DISCONTINUED] metFORMIN (GLUCOPHAGE) 500 MG tablet Take 1 tablet (500 mg total) by mouth 2 (  two) times daily with a meal.   metFORMIN (GLUCOPHAGE) 500 MG tablet Take 1 tablet (500 mg total) by mouth 2 (two) times daily with a meal.   No facility-administered encounter medications on file as of 04/03/2022.    Allergies  Allergen Reactions   Farxiga [Dapagliflozin]     Yeast infections   Sulfa Antibiotics Hives and Itching    welts    Review of Systems  Constitutional:  Negative for activity change, appetite change, chills, diaphoresis, fatigue, fever and unexpected weight change.  HENT: Negative.    Eyes: Negative.  Negative for photophobia and visual disturbance.  Respiratory:  Negative for cough, chest tightness and shortness of breath.   Cardiovascular:  Negative for chest pain, palpitations and leg swelling.  Gastrointestinal:  Negative for abdominal distention, abdominal pain, anal bleeding, blood in stool, constipation, diarrhea, nausea and vomiting.  Endocrine: Negative.  Negative for polydipsia, polyphagia and polyuria.  Genitourinary:  Negative for decreased urine volume, difficulty urinating, dysuria, frequency and urgency.  Musculoskeletal:  Negative for arthralgias and myalgias.  Skin: Negative.    Allergic/Immunologic: Negative.   Neurological:  Negative for dizziness, tremors, seizures, syncope, facial asymmetry, speech difficulty, weakness, light-headedness, numbness and headaches.  Hematological: Negative.   Psychiatric/Behavioral:  Negative for confusion, hallucinations, sleep disturbance and suicidal ideas.   All other systems reviewed and are negative.       Objective:  BP 129/73   Pulse 93   Temp 98.2 F (36.8 C)   Ht _0  (1.6 m)   Wt 172 lb 3.2 oz (78.1 kg)   SpO2 93%   BMI 30.50 kg/m    Wt Readings from Last 3 Encounters:  04/03/22 172 lb 3.2 oz (78.1 kg)  12/25/21 166 lb (75.3 kg)  10/06/21 157 lb (71.2 kg)    Physical Exam Vitals and nursing note reviewed.  Constitutional:      General: She is not in acute distress.    Appearance: Normal appearance. She is well-developed and well-groomed. She is obese. She is not ill-appearing, toxic-appearing or diaphoretic.  HENT:     Head: Normocephalic and atraumatic.     Jaw: There is normal jaw occlusion.     Right Ear: Hearing normal.     Left Ear: Hearing normal.     Nose: Nose normal.     Mouth/Throat:     Lips: Pink.     Mouth: Mucous membranes are moist.     Pharynx: Oropharynx is clear. Uvula midline.  Eyes:     General: Lids are normal.     Extraocular Movements: Extraocular movements intact.     Conjunctiva/sclera: Conjunctivae normal.     Pupils: Pupils are equal, round, and reactive to light.  Neck:     Thyroid: No thyroid mass, thyromegaly or thyroid tenderness.     Vascular: No carotid bruit or JVD.     Trachea: Trachea and phonation normal.  Cardiovascular:     Rate and Rhythm: Normal rate and regular rhythm.     Chest Wall: PMI is not displaced.     Pulses: Normal pulses.     Heart sounds: Normal heart sounds. No murmur heard.    No friction rub. No gallop.  Pulmonary:     Effort: Pulmonary effort is normal. No respiratory distress.     Breath sounds: Normal breath sounds. No  wheezing.  Abdominal:     General: Bowel sounds are normal. There is no distension or abdominal bruit.     Palpations: Abdomen is soft.  There is no hepatomegaly or splenomegaly.     Tenderness: There is no abdominal tenderness. There is no right CVA tenderness or left CVA tenderness.     Hernia: No hernia is present.  Musculoskeletal:        General: Normal range of motion.     Cervical back: Normal range of motion and neck supple.     Right lower leg: No edema.     Left lower leg: No edema.  Lymphadenopathy:     Cervical: No cervical adenopathy.  Skin:    General: Skin is warm and dry.     Capillary Refill: Capillary refill takes less than 2 seconds.     Coloration: Skin is not cyanotic, jaundiced or pale.     Findings: No rash.  Neurological:     General: No focal deficit present.     Mental Status: She is alert and oriented to person, place, and time.     Sensory: Sensation is intact.     Motor: Motor function is intact.     Coordination: Coordination is intact.     Gait: Gait is intact.     Deep Tendon Reflexes: Reflexes are normal and symmetric.  Psychiatric:        Attention and Perception: Attention and perception normal.        Mood and Affect: Mood and affect normal.        Speech: Speech normal.        Behavior: Behavior normal. Behavior is cooperative.        Thought Content: Thought content normal.        Cognition and Memory: Cognition and memory normal.        Judgment: Judgment normal.     Results for orders placed or performed in visit on 12/25/21  Lipid panel  Result Value Ref Range   Cholesterol, Total 167 100 - 199 mg/dL   Triglycerides 123 0 - 149 mg/dL   HDL 70 >39 mg/dL   VLDL Cholesterol Cal 21 5 - 40 mg/dL   LDL Chol Calc (NIH) 76 0 - 99 mg/dL   Chol/HDL Ratio 2.4 0.0 - 4.4 ratio  CBC with Differential/Platelet  Result Value Ref Range   WBC 7.6 3.4 - 10.8 x10E3/uL   RBC 4.21 3.77 - 5.28 x10E6/uL   Hemoglobin 11.8 11.1 - 15.9 g/dL   Hematocrit  36.6 34.0 - 46.6 %   MCV 87 79 - 97 fL   MCH 28.0 26.6 - 33.0 pg   MCHC 32.2 31.5 - 35.7 g/dL   RDW 13.3 11.7 - 15.4 %   Platelets 378 150 - 450 x10E3/uL   Neutrophils 59 Not Estab. %   Lymphs 27 Not Estab. %   Monocytes 7 Not Estab. %   Eos 6 Not Estab. %   Basos 1 Not Estab. %   Neutrophils Absolute 4.5 1.4 - 7.0 x10E3/uL   Lymphocytes Absolute 2.1 0.7 - 3.1 x10E3/uL   Monocytes Absolute 0.5 0.1 - 0.9 x10E3/uL   EOS (ABSOLUTE) 0.5 (H) 0.0 - 0.4 x10E3/uL   Basophils Absolute 0.1 0.0 - 0.2 x10E3/uL   Immature Granulocytes 0 Not Estab. %   Immature Grans (Abs) 0.0 0.0 - 0.1 x10E3/uL  CMP14+EGFR  Result Value Ref Range   Glucose 167 (H) 70 - 99 mg/dL   BUN 13 8 - 27 mg/dL   Creatinine, Ser 1.28 (H) 0.57 - 1.00 mg/dL   eGFR 45 (L) >59 mL/min/1.73   BUN/Creatinine Ratio 10 (L) 12 - 28  Sodium 137 134 - 144 mmol/L   Potassium 4.5 3.5 - 5.2 mmol/L   Chloride 97 96 - 106 mmol/L   CO2 23 20 - 29 mmol/L   Calcium 9.6 8.7 - 10.3 mg/dL   Total Protein 6.6 6.0 - 8.5 g/dL   Albumin 4.6 3.8 - 4.8 g/dL   Globulin, Total 2.0 1.5 - 4.5 g/dL   Albumin/Globulin Ratio 2.3 (H) 1.2 - 2.2   Bilirubin Total 0.6 0.0 - 1.2 mg/dL   Alkaline Phosphatase 81 44 - 121 IU/L   AST 9 0 - 40 IU/L   ALT 6 0 - 32 IU/L  Bayer DCA Hb A1c Waived  Result Value Ref Range   HB A1C (BAYER DCA - WAIVED) 7.6 (H) 4.8 - 5.6 %       Pertinent labs & imaging results that were available during my care of the patient were reviewed by me and considered in my medical decision making.  Assessment & Plan:  Alee was seen today for diabetes.  Diagnoses and all orders for this visit:  Type 2 diabetes mellitus with hyperglycemia, without long-term current use of insulin (HCC) A1C 7.7, will increase Glipizide to 10 mg daily. Diet and exercise encouraged. Other labs pending.  -     Cancel: CMP14+EGFR -     Bayer DCA Hb A1c Waived -     BMP8+EGFR -     Microalbumin / creatinine urine ratio -     metFORMIN (GLUCOPHAGE)  500 MG tablet; Take 1 tablet (500 mg total) by mouth 2 (two) times daily with a meal. -     glipiZIDE (GLUCOTROL) 10 MG tablet; Take 1 tablet (10 mg total) by mouth daily before breakfast.  CKD stage 3 due to type 2 diabetes mellitus ([place]) Unable to tolerate Farxiga due to recurrent yeast. Labs pending.  -     BMP8+EGFR -     Microalbumin / creatinine urine ratio  Hypertension associated with type 2 diabetes mellitus (HCC) BP well controlled. Changes were not made in regimen today. Goal BP is 130/80. Pt aware to report any persistent high or low readings. DASH diet and exercise encouraged. Exercise at least 150 minutes per week and increase as tolerated. Goal BMI > 25. Stress management encouraged. Avoid nicotine and tobacco product use. Avoid excessive alcohol and NSAID's. Avoid more than 2000 mg of sodium daily. Medications as prescribed. Follow up as scheduled.  -     BMP8+EGFR -     Microalbumin / creatinine urine ratio  Hyperlipidemia associated with type 2 diabetes mellitus ([place]) Diet encouraged - increase intake of fresh fruits and vegetables, increase intake of lean proteins. Bake, broil, or grill foods. Avoid fried, greasy, and fatty foods. Avoid fast foods. Increase intake of fiber-rich whole grains. Exercise encouraged - at least 150 minutes per week and advance as tolerated.  Goal BMI < 25. Continue medications as prescribed. Follow up in 3-6 months as discussed.  -     BMP8+EGFR -     Microalbumin / creatinine urine ratio     Continue all other maintenance medications.  Follow up plan: Return in about 3 months (around 07/03/2022) for DM.   Continue healthy lifestyle choices, including diet (rich in fruits, vegetables, and lean proteins, and low in salt and simple carbohydrates) and exercise (at least 30 minutes of moderate physical activity daily).  Educational handout given for DM  The above assessment and management plan was discussed with the patient. The patient  verbalized understanding of and  has agreed to the management plan. Patient is aware to call the clinic if they develop any new symptoms or if symptoms persist or worsen. Patient is aware when to return to the clinic for a follow-up visit. Patient educated on when it is appropriate to go to the emergency department.   Michelle Zahirah Cheslock, FNP-C [facility] [phone]   

## 2022-04-04 LAB — BMP8+EGFR
BUN/Creatinine Ratio: 10 — ABNORMAL LOW (ref 12–28)
BUN: 14 mg/dL (ref 8–27)
CO2: 19 mmol/L — ABNORMAL LOW (ref 20–29)
Calcium: 9.6 mg/dL (ref 8.7–10.3)
Chloride: 100 mmol/L (ref 96–106)
Creatinine, Ser: 1.36 mg/dL — ABNORMAL HIGH (ref 0.57–1.00)
Glucose: 155 mg/dL — ABNORMAL HIGH (ref 70–99)
Potassium: 4.7 mmol/L (ref 3.5–5.2)
Sodium: 139 mmol/L (ref 134–144)
eGFR: 41 mL/min/{1.73_m2} — ABNORMAL LOW (ref >59)

## 2022-04-04 LAB — MICROALBUMIN / CREATININE URINE RATIO
Creatinine, Urine: 218.6 mg/dL
Microalb/Creat Ratio: 13 mg/g creat (ref 0–29)
Microalbumin, Urine: 27.6 ug/mL

## 2022-04-10 ENCOUNTER — Other Ambulatory Visit: Payer: Self-pay | Admitting: Family Medicine

## 2022-04-10 DIAGNOSIS — J0110 Acute frontal sinusitis, unspecified: Secondary | ICD-10-CM

## 2022-04-10 DIAGNOSIS — I10 Essential (primary) hypertension: Secondary | ICD-10-CM

## 2022-05-08 ENCOUNTER — Encounter: Payer: Self-pay | Admitting: Pharmacist

## 2022-05-08 NOTE — Telephone Encounter (Signed)
Received notification from [facility] regarding RE-ENROLLMENT approval for RYBELSUS 14. Patient assistance approved from 05/04/22 to 04/27/23.  MEDICATION WILL SHIP TO OFFICE  Phone: [phone]

## 2022-05-09 ENCOUNTER — Other Ambulatory Visit: Payer: Self-pay | Admitting: Family Medicine

## 2022-05-09 DIAGNOSIS — G479 Sleep disorder, unspecified: Secondary | ICD-10-CM

## 2022-05-10 NOTE — Telephone Encounter (Signed)
Last office visit 04/03/22 Last refill 02/06/22, #90, no refill

## 2022-06-21 LAB — HM DIABETES EYE EXAM

## 2022-06-23 DIAGNOSIS — H40033 Anatomical narrow angle, bilateral: Secondary | ICD-10-CM | POA: Diagnosis not present

## 2022-06-23 DIAGNOSIS — E119 Type 2 diabetes mellitus without complications: Secondary | ICD-10-CM | POA: Diagnosis not present

## 2022-07-07 ENCOUNTER — Ambulatory Visit (INDEPENDENT_AMBULATORY_CARE_PROVIDER_SITE_OTHER): Payer: Medicare Other | Admitting: Family Medicine

## 2022-07-07 ENCOUNTER — Encounter: Payer: Self-pay | Admitting: Family Medicine

## 2022-07-07 VITALS — BP 146/74 | HR 84 | Temp 98.2°F | Ht 63.0 in | Wt 174.6 lb

## 2022-07-07 DIAGNOSIS — E1159 Type 2 diabetes mellitus with other circulatory complications: Secondary | ICD-10-CM | POA: Diagnosis not present

## 2022-07-07 DIAGNOSIS — E785 Hyperlipidemia, unspecified: Secondary | ICD-10-CM

## 2022-07-07 DIAGNOSIS — F419 Anxiety disorder, unspecified: Secondary | ICD-10-CM | POA: Diagnosis not present

## 2022-07-07 DIAGNOSIS — E1169 Type 2 diabetes mellitus with other specified complication: Secondary | ICD-10-CM

## 2022-07-07 DIAGNOSIS — G479 Sleep disorder, unspecified: Secondary | ICD-10-CM

## 2022-07-07 DIAGNOSIS — E1165 Type 2 diabetes mellitus with hyperglycemia: Secondary | ICD-10-CM | POA: Diagnosis not present

## 2022-07-07 DIAGNOSIS — I152 Hypertension secondary to endocrine disorders: Secondary | ICD-10-CM

## 2022-07-07 DIAGNOSIS — L209 Atopic dermatitis, unspecified: Secondary | ICD-10-CM

## 2022-07-07 LAB — BAYER DCA HB A1C WAIVED: HB A1C (BAYER DCA - WAIVED): 6.4 % — ABNORMAL HIGH (ref 4.8–5.6)

## 2022-07-07 MED ORDER — AMLODIPINE BESYLATE 10 MG PO TABS: 10.0000 mg | ORAL_TABLET | Freq: Every day | ORAL | 3 refills | Status: DC

## 2022-07-07 MED ORDER — TRIAMCINOLONE ACETONIDE 0.1 % EX CREA: 1.0000 | TOPICAL_CREAM | Freq: Two times a day (BID) | CUTANEOUS | 0 refills | Status: DC

## 2022-07-07 MED ORDER — CITALOPRAM HYDROBROMIDE 40 MG PO TABS: 40.0000 mg | ORAL_TABLET | Freq: Every day | ORAL | 3 refills | Status: DC

## 2022-07-07 MED ORDER — TRAZODONE HCL 50 MG PO TABS: 50.0000 mg | ORAL_TABLET | Freq: Every evening | ORAL | 3 refills | Status: DC | PRN

## 2022-07-07 MED ORDER — RYBELSUS 14 MG PO TABS: 14.0000 mg | ORAL_TABLET | Freq: Every day | ORAL | 3 refills | Status: DC

## 2022-07-07 MED ORDER — LOVASTATIN 40 MG PO TABS: 40.0000 mg | ORAL_TABLET | Freq: Every day | ORAL | 3 refills | Status: DC

## 2022-07-07 NOTE — Progress Notes (Signed)
Subjective:  Patient ID: Jeanne Medina, female    DOB: 1950-09-20, 72 y.o.   MRN: [id number]  Patient Care Team: Baruch Gouty, FNP as PCP - General (Family Medicine) Lavera Guise, [facility] (Pharmacist) Harlen Labs, MD as Referring Physician (Optometry)   Chief Complaint:  Diabetes (3 month follow up )   HPI: Jeanne Medina is a 72 y.o. female presenting on 07/07/2022 for Diabetes (3 month follow up )   1. Type 2 diabetes mellitus with hyperglycemia, without long-term current use of insulin ([place]) States she has been doing very well with current regimen and has been working hard on diet and exercise. Denies associated side effects from medications. No polyuria, polyphagia, or polydipsia.   2. Hypertension associated with type 2 diabetes mellitus ([place]) Compliant with medications, tolerating well. No headaches, chest pain, leg swelling, weakness, confusion, vision changes, or syncope.   3. Hyperlipidemia associated with type 2 diabetes mellitus ([place]) On statin therapy and tolerating well. Denies myalgias. Does try to follow a healthy diet and is active on a regular basis.   4. Anxiety On Celexa and tolerating very well. Denies new or worsening symptoms.     07/07/2022    8:18 AM 12/25/2021    9:10 AM 10/06/2021   11:35 AM 08/19/2021    8:04 AM 05/21/2021    7:59 AM  Depression screen PHQ 2/9  Decreased Interest 0 0 0 0 0  Down, Depressed, Hopeless 0 0 0 0 0  PHQ - 2 Score 0 0 0 0 0  Altered sleeping 0 0 0 1 0  Tired, decreased energy 1 0 0 0 0  Change in appetite 0 0 0 0 0  Feeling bad or failure about yourself  0 0 0 0 0  Trouble concentrating 0 0 0 0 0  Moving slowly or fidgety/restless 0 0 0 0 0  Suicidal thoughts 0 0 0 0 0  PHQ-9 Score 1 0 0 1 0  Difficult doing work/chores Not difficult at all Not difficult at all Not difficult at all Not difficult at all Not difficult at all      07/07/2022    8:18 AM 12/25/2021    9:10 AM 08/19/2021    8:04 AM 05/21/2021     7:59 AM  GAD 7 : Generalized Anxiety Score  Nervous, Anxious, on Edge 0 0 0 0  Control/stop worrying 0 0 0 0  Worry too much - different things 0 0 0 0  Trouble relaxing 0 0 0 0  Restless 0 0 0 0  Easily annoyed or irritable 0 0 0 0  Afraid - awful might happen 0 0 0 0  Total GAD 7 Score 0 0 0 0  Anxiety Difficulty Not difficult at all Not difficult at all Not difficult at all Not difficult at all    5. Difficulty sleeping Takes Trazodone as needed and is tolerating well. No daytime fatigue from the medications.      Relevant past medical, surgical, family, and social history reviewed and updated as indicated.  Allergies and medications reviewed and updated. Data reviewed: Chart in Epic.   Past Medical History:  Diagnosis Date   Anxiety    Arthritis    osteoarthritis   Asthma    CKD (chronic kidney disease) stage 3, GFR 30-59 ml/min (HCC)    Diabetes mellitus without complication ([place])    Hypercholesteremia    Hypertension     Past Surgical History:  Procedure  Laterality Date   CATARACT EXTRACTION W/PHACO Left 08/18/2016   Procedure: CATARACT EXTRACTION PHACO AND INTRAOCULAR LENS PLACEMENT (IOC);  Surgeon: Rutherford Guys, MD;  Location: [facility];  Service: Ophthalmology;  Laterality: Left;  CDE: 6.05   CATARACT EXTRACTION W/PHACO Left 09/01/2016   Procedure: REPOSITIONING OF LEFT IOL LENS;  Surgeon: Rutherford Guys, MD;  Location: [facility];  Service: Ophthalmology;  Laterality: Left;   CATARACT EXTRACTION W/PHACO Right 10/06/2016   Procedure: CATARACT EXTRACTION PHACO AND INTRAOCULAR LENS PLACEMENT (IOC);  Surgeon: Rutherford Guys, MD;  Location: [facility];  Service: Ophthalmology;  Laterality: Right;  CDE: 6.21    Social History   Socioeconomic History   Marital status: Married    Spouse name: Sonia Side   Number of children: 0   Years of education: Not on file   Highest education level: Not on file  Occupational History   Occupation: retired  Tobacco Use   Smoking status: Former     Packs/day: 0.25    Years: 20.00    Total pack years: 5.00    Types: Cigarettes    Quit date: 08/13/1980    Years since quitting: 41.9   Smokeless tobacco: Never  Vaping Use   Vaping Use: Never used  Substance and Sexual Activity   Alcohol use: No   Drug use: No   Sexual activity: Yes    Birth control/protection: Post-menopausal  Other Topics Concern   Not on file  Social History Narrative   Lives home with her husband. Works out at [employer] 60 min 3x per week and walks 30 minutes other days.   Sister lives in [place] Determinants of Health   Financial Resource Strain: Low Risk  (10/06/2021)   Overall Financial Resource Strain (CARDIA)    Difficulty of Paying Living Expenses: Not hard at all  Food Insecurity: No Food Insecurity (10/06/2021)   Hunger Vital Sign    Worried About Running Out of Food in the Last Year: Never true    Ran Out of Food in the Last Year: Never true  Transportation Needs: No Transportation Needs (10/06/2021)   PRAPARE - Hydrologist (Medical): No    Lack of Transportation (Non-Medical): No  Physical Activity: Sufficiently Active (10/06/2021)   Exercise Vital Sign    Days of Exercise per Week: 3 days    Minutes of Exercise per Session: 60 min  Stress: No Stress Concern Present (10/06/2021)   [place]    Feeling of Stress : Not at all  Social Connections: [place] (10/06/2021)   Social Connection and Isolation Panel [NHANES]    Frequency of Communication with Friends and Family: More than three times a week    Frequency of Social Gatherings with Friends and Family: More than three times a week    Attends Religious Services: More than 4 times per year    Active Member of [employer] or Organizations: Yes    Attends Music therapist: More than 4 times per year    Marital Status: Married  Human resources officer Violence: Not At Risk (10/06/2021)    Humiliation, Afraid, Rape, and Kick questionnaire    Fear of Current or Ex-Partner: No    Emotionally Abused: No    Physically Abused: No    Sexually Abused: No    Outpatient Encounter Medications as of 07/07/2022  Medication Sig   acetaminophen (TYLENOL) 325 MG tablet Take 2 tablets (650 mg total) by mouth every  6 (six) hours as needed for mild pain (or Fever >/= 101).   albuterol (PROVENTIL) (2.5 MG/3ML) 0.083% nebulizer solution Take 3 mLs (2.5 mg total) by nebulization every 2 (two) hours as needed for wheezing or shortness of breath.   albuterol (VENTOLIN HFA) 108 (90 Base) MCG/ACT inhaler Inhale 2 puffs into the lungs every 4 (four) hours as needed for wheezing or shortness of breath.   diphenhydrAMINE (BENADRYL) 25 MG tablet Take 25 mg by mouth at bedtime as needed (for allergies.).   fluticasone (FLONASE) 50 MCG/ACT nasal spray Use 1 spray(s) in each nostril twice daily   glipiZIDE (GLUCOTROL) 10 MG tablet Take 1 tablet (10 mg total) by mouth daily before breakfast.   L-Lysine 500 MG CAPS Take 1 capsule by mouth daily.   lisinopril-hydrochlorothiazide (ZESTORETIC) 20-25 MG tablet Take 1 tablet by mouth once daily   metFORMIN (GLUCOPHAGE) 500 MG tablet Take 1 tablet (500 mg total) by mouth 2 (two) times daily with a meal.   [DISCONTINUED] amLODipine (NORVASC) 10 MG tablet Take 1 tablet by mouth once daily   [DISCONTINUED] citalopram (CELEXA) 40 MG tablet Take 1 tablet (40 mg total) by mouth daily.   [DISCONTINUED] lovastatin (MEVACOR) 40 MG tablet Take 1 tablet (40 mg total) by mouth at bedtime.   [DISCONTINUED] Semaglutide (RYBELSUS) 14 MG TABS Take 14 mg by mouth daily.   [DISCONTINUED] traZODone (DESYREL) 50 MG tablet TAKE 1 TABLET BY MOUTH AT BEDTIME AS NEEDED FOR SLEEP   amLODipine (NORVASC) 10 MG tablet Take 1 tablet (10 mg total) by mouth daily.   citalopram (CELEXA) 40 MG tablet Take 1 tablet (40 mg total) by mouth daily.   lovastatin (MEVACOR) 40 MG tablet Take 1 tablet (40 mg  total) by mouth at bedtime.   Semaglutide (RYBELSUS) 14 MG TABS Take 1 tablet (14 mg total) by mouth daily.   traZODone (DESYREL) 50 MG tablet Take 1 tablet (50 mg total) by mouth at bedtime as needed. for sleep   No facility-administered encounter medications on file as of 07/07/2022.    Allergies  Allergen Reactions   Farxiga [Dapagliflozin]     Yeast infections   Sulfa Antibiotics Hives and Itching    welts    Review of Systems  Constitutional:  Positive for fatigue. Negative for activity change, appetite change, chills, diaphoresis, fever and unexpected weight change.  HENT: Negative.    Eyes: Negative.  Negative for photophobia and visual disturbance.  Respiratory:  Negative for cough, chest tightness and shortness of breath.   Cardiovascular:  Negative for chest pain, palpitations and leg swelling.  Gastrointestinal:  Negative for abdominal pain, blood in stool, constipation, diarrhea, nausea and vomiting.  Endocrine: Negative.  Negative for polydipsia, polyphagia and polyuria.  Genitourinary:  Negative for decreased urine volume, difficulty urinating, dysuria, frequency and urgency.  Musculoskeletal:  Negative for arthralgias and myalgias.  Skin: Negative.   Allergic/Immunologic: Negative.   Neurological:  Negative for dizziness, tremors, seizures, syncope, facial asymmetry, speech difficulty, weakness, light-headedness, numbness and headaches.  Hematological: Negative.   Psychiatric/Behavioral:  Positive for sleep disturbance. Negative for agitation, behavioral problems, confusion, decreased concentration, dysphoric mood, hallucinations, self-injury and suicidal ideas. The patient is not nervous/anxious and is not hyperactive.   All other systems reviewed and are negative.       Objective:  BP (!) 146/74   Pulse 84   Temp 98.2 F (36.8 C) (Temporal)   Ht '5\' 3"'$  (1.6 m)   Wt 174 lb 9.6 oz (79.2 kg)  SpO2 94%   BMI 30.93 kg/m    Wt Readings from Last 3 Encounters:   07/07/22 174 lb 9.6 oz (79.2 kg)  04/03/22 172 lb 3.2 oz (78.1 kg)  12/25/21 166 lb (75.3 kg)    Physical Exam Vitals and nursing note reviewed.  Constitutional:      General: She is not in acute distress.    Appearance: Normal appearance. She is well-developed and well-groomed. She is obese. She is not ill-appearing, toxic-appearing or diaphoretic.  HENT:     Head: Normocephalic and atraumatic.     Jaw: There is normal jaw occlusion.     Right Ear: Hearing normal.     Left Ear: Hearing normal.     Nose: Nose normal.     Mouth/Throat:     Lips: Pink.     Mouth: Mucous membranes are moist.     Pharynx: Oropharynx is clear. Uvula midline.  Eyes:     General: Lids are normal.     Extraocular Movements: Extraocular movements intact.     Conjunctiva/sclera: Conjunctivae normal.     Pupils: Pupils are equal, round, and reactive to light.  Neck:     Thyroid: No thyroid mass, thyromegaly or thyroid tenderness.     Vascular: No carotid bruit or JVD.     Trachea: Trachea and phonation normal.  Cardiovascular:     Rate and Rhythm: Normal rate and regular rhythm.     Chest Wall: PMI is not displaced.     Pulses: Normal pulses.     Heart sounds: Normal heart sounds. No murmur heard.    No friction rub. No gallop.  Pulmonary:     Effort: Pulmonary effort is normal. No respiratory distress.     Breath sounds: Normal breath sounds. No wheezing.  Abdominal:     General: Bowel sounds are normal. There is no distension or abdominal bruit.     Palpations: Abdomen is soft. There is no hepatomegaly or splenomegaly.     Tenderness: There is no abdominal tenderness. There is no right CVA tenderness or left CVA tenderness.     Hernia: No hernia is present.  Musculoskeletal:        General: Normal range of motion.     Cervical back: Normal range of motion and neck supple.     Right lower leg: No edema.     Left lower leg: No edema.  Lymphadenopathy:     Cervical: No cervical adenopathy.   Skin:    General: Skin is warm and dry.     Capillary Refill: Capillary refill takes less than 2 seconds.     Coloration: Skin is not cyanotic, jaundiced or pale.     Findings: No rash.  Neurological:     General: No focal deficit present.     Mental Status: She is alert and oriented to person, place, and time.     Sensory: Sensation is intact.     Motor: Motor function is intact.     Coordination: Coordination is intact.     Gait: Gait is intact.     Deep Tendon Reflexes: Reflexes are normal and symmetric.  Psychiatric:        Attention and Perception: Attention and perception normal.        Mood and Affect: Mood and affect normal.        Speech: Speech normal.        Behavior: Behavior normal. Behavior is cooperative.        Thought Content: Thought  content normal.        Cognition and Memory: Cognition and memory normal.        Judgment: Judgment normal.     Results for orders placed or performed in visit on 04/03/22  Bayer DCA Hb A1c Waived  Result Value Ref Range   HB A1C (BAYER DCA - WAIVED) 7.7 (H) 4.8 - 5.6 %  BMP8+EGFR  Result Value Ref Range   Glucose 155 (H) 70 - 99 mg/dL   BUN 14 8 - 27 mg/dL   Creatinine, Ser 1.36 (H) 0.57 - 1.00 mg/dL   eGFR 41 (L) >59 mL/min/1.73   BUN/Creatinine Ratio 10 (L) 12 - 28   Sodium 139 134 - 144 mmol/L   Potassium 4.7 3.5 - 5.2 mmol/L   Chloride 100 96 - 106 mmol/L   CO2 19 (L) 20 - 29 mmol/L   Calcium 9.6 8.7 - 10.3 mg/dL  Microalbumin / creatinine urine ratio  Result Value Ref Range   Creatinine, Urine 218.6 Not Estab. mg/dL   Microalbumin, Urine 27.6 Not Estab. ug/mL   Microalb/Creat Ratio 13 0 - 29 mg/g creat       Pertinent labs & imaging results that were available during my care of the patient were reviewed by me and considered in my medical decision making.  Assessment & Plan:  [place] was seen today for diabetes.  Diagnoses and all orders for this visit:  Type 2 diabetes mellitus with hyperglycemia, without  long-term current use of insulin (HCC) A1C 6.4 in office today, great job. Continue current regimen. Diet and exercise encouraged.  -     Bayer DCA Hb A1c Waived -     Vitamin B12 -     lovastatin (MEVACOR) 40 MG tablet; Take 1 tablet (40 mg total) by mouth at bedtime. -     Semaglutide (RYBELSUS) 14 MG TABS; Take 1 tablet (14 mg total) by mouth daily.  Hypertension associated with type 2 diabetes mellitus (HCC) BP fairly controlled. Changes were not made in regimen today. Goal BP is 130/80. Pt aware to report any persistent high or low readings. DASH diet and exercise encouraged. Exercise at least 150 minutes per week and increase as tolerated. Goal BMI > 25. Stress management encouraged. Avoid nicotine and tobacco product use. Avoid excessive alcohol and NSAID's. Avoid more than 2000 mg of sodium daily. Medications as prescribed. Follow up as scheduled.  -     CMP14+EGFR -     amLODipine (NORVASC) 10 MG tablet; Take 1 tablet (10 mg total) by mouth daily.  Hyperlipidemia associated with type 2 diabetes mellitus ([place]) Diet encouraged - increase intake of fresh fruits and vegetables, increase intake of lean proteins. Bake, broil, or grill foods. Avoid fried, greasy, and fatty foods. Avoid fast foods. Increase intake of fiber-rich whole grains. Exercise encouraged - at least 150 minutes per week and advance as tolerated.  Goal BMI < 25. Continue medications as prescribed. Follow up in 3-6 months as discussed.  -     lovastatin (MEVACOR) 40 MG tablet; Take 1 tablet (40 mg total) by mouth at bedtime.  Anxiety Doing well on below, will continue.  -     citalopram (CELEXA) 40 MG tablet; Take 1 tablet (40 mg total) by mouth daily.  Difficulty sleeping Doing well on below, will continue.  -     traZODone (DESYREL) 50 MG tablet; Take 1 tablet (50 mg total) by mouth at bedtime as needed. for sleep     Continue all other  maintenance medications.  Follow up plan: Return in about 3 months (around  10/07/2022), or if symptoms worsen or fail to improve, for DM.   Continue healthy lifestyle choices, including diet (rich in fruits, vegetables, and lean proteins, and low in salt and simple carbohydrates) and exercise (at least 30 minutes of moderate physical activity daily).  Educational handout given for DM  The above assessment and management plan was discussed with the patient. The patient verbalized understanding of and has agreed to the management plan. Patient is aware to call the clinic if they develop any new symptoms or if symptoms persist or worsen. Patient is aware when to return to the clinic for a follow-up visit. Patient educated on when it is appropriate to go to the emergency department.   Monia Pouch, FNP-C [facility] [phone]

## 2022-07-07 NOTE — Addendum Note (Signed)
Addended by: Baruch Gouty on: 07/07/2022 01:48 PM   Modules accepted: Orders

## 2022-07-07 NOTE — Patient Instructions (Addendum)

## 2022-07-08 ENCOUNTER — Other Ambulatory Visit: Payer: Self-pay | Admitting: Family Medicine

## 2022-07-08 DIAGNOSIS — I10 Essential (primary) hypertension: Secondary | ICD-10-CM

## 2022-07-08 LAB — VITAMIN B12: Vitamin B-12: >2000 pg/mL — ABNORMAL HIGH (ref 232–1245)

## 2022-07-08 LAB — CMP14+EGFR
ALT: 6 IU/L (ref 0–32)
AST: 9 IU/L (ref 0–40)
Albumin/Globulin Ratio: 2 (ref 1.2–2.2)
Albumin: 4.3 g/dL (ref 3.8–4.8)
Alkaline Phosphatase: 98 IU/L (ref 44–121)
BUN/Creatinine Ratio: 13 (ref 12–28)
BUN: 17 mg/dL (ref 8–27)
Bilirubin Total: 0.5 mg/dL (ref 0.0–1.2)
CO2: 23 mmol/L (ref 20–29)
Calcium: 9.5 mg/dL (ref 8.7–10.3)
Chloride: 100 mmol/L (ref 96–106)
Creatinine, Ser: 1.35 mg/dL — ABNORMAL HIGH (ref 0.57–1.00)
Globulin, Total: 2.2 g/dL (ref 1.5–4.5)
Glucose: 109 mg/dL — ABNORMAL HIGH (ref 70–99)
Potassium: 4.3 mmol/L (ref 3.5–5.2)
Sodium: 138 mmol/L (ref 134–144)
Total Protein: 6.5 g/dL (ref 6.0–8.5)
eGFR: 42 mL/min/{1.73_m2} — ABNORMAL LOW (ref >59)

## 2022-10-07 ENCOUNTER — Encounter: Payer: Self-pay | Admitting: Family Medicine

## 2022-10-07 ENCOUNTER — Ambulatory Visit (INDEPENDENT_AMBULATORY_CARE_PROVIDER_SITE_OTHER): Payer: Medicare Other | Admitting: Family Medicine

## 2022-10-07 VITALS — BP 135/71 | HR 95 | Temp 98.0°F | Ht 63.0 in | Wt 174.6 lb

## 2022-10-07 DIAGNOSIS — E1169 Type 2 diabetes mellitus with other specified complication: Secondary | ICD-10-CM | POA: Diagnosis not present

## 2022-10-07 DIAGNOSIS — I152 Hypertension secondary to endocrine disorders: Secondary | ICD-10-CM | POA: Diagnosis not present

## 2022-10-07 DIAGNOSIS — E1165 Type 2 diabetes mellitus with hyperglycemia: Secondary | ICD-10-CM | POA: Diagnosis not present

## 2022-10-07 DIAGNOSIS — E119 Type 2 diabetes mellitus without complications: Secondary | ICD-10-CM | POA: Insufficient documentation

## 2022-10-07 DIAGNOSIS — Z7984 Long term (current) use of oral hypoglycemic drugs: Secondary | ICD-10-CM

## 2022-10-07 DIAGNOSIS — E1122 Type 2 diabetes mellitus with diabetic chronic kidney disease: Secondary | ICD-10-CM

## 2022-10-07 DIAGNOSIS — E1159 Type 2 diabetes mellitus with other circulatory complications: Secondary | ICD-10-CM | POA: Diagnosis not present

## 2022-10-07 DIAGNOSIS — E785 Hyperlipidemia, unspecified: Secondary | ICD-10-CM

## 2022-10-07 DIAGNOSIS — N183 Chronic kidney disease, stage 3 unspecified: Secondary | ICD-10-CM

## 2022-10-07 LAB — BAYER DCA HB A1C WAIVED: HB A1C (BAYER DCA - WAIVED): 6.4 % — ABNORMAL HIGH (ref 4.8–5.6)

## 2022-10-07 NOTE — Progress Notes (Signed)
Subjective:  Patient ID: Jeanne Medina, female    DOB: Oct 28, 1950, 72 y.o.   MRN: [id number]  Patient Care Team: Sonny Masters, FNP as PCP - General (Family Medicine) Danella Maiers, [facility] (Pharmacist) Michaelle Copas, MD as Referring Physician (Optometry)   Chief Complaint:  Diabetes (3 month follow up )   HPI: Jeanne Medina is a 72 y.o. female presenting on 10/07/2022 for Diabetes (3 month follow up )   Diabetes She presents for her follow-up diabetic visit. She has type 2 diabetes mellitus. No MedicAlert identification noted. Her disease course has been stable. There are no hypoglycemic associated symptoms. Pertinent negatives for diabetes include no blurred vision, no chest pain, no fatigue, no foot paresthesias, no foot ulcerations, no polydipsia, no polyphagia, no polyuria, no visual change and no weakness. There are no hypoglycemic complications. Symptoms are stable. There are no diabetic complications. Risk factors for coronary artery disease include diabetes mellitus, dyslipidemia, hypertension and post-menopausal. Current diabetic treatment includes oral agent (triple therapy). Her weight is stable. She is following a low salt and diabetic diet. She participates in exercise daily. There is no change in her home blood glucose trend. An ACE inhibitor/angiotensin II receptor blocker is being taken.   1. Type 2 diabetes mellitus with hyperglycemia, without long-term current use of insulin (HCC) States she has been doing very well with current regimen and has been working hard on diet and exercise. Goes to [employer] daily for [employer] training and Cardio. Denies associated side effects from medications. No polyuria, polyphagia, or polydipsia.    2. Hypertension associated with type 2 diabetes mellitus (HCC) Compliant with medications, tolerating well. No headaches, chest pain, leg swelling, weakness, confusion, vision changes, or syncope.    3. Hyperlipidemia associated with type 2  diabetes mellitus (HCC) On statin therapy and tolerating well. Denies myalgias. Does try to follow a healthy diet and is active on a regular basis.    4. Anxiety On Celexa and tolerating very well. Denies new or worsening symptoms. Denies SI/HI  5. Difficulty sleeping Doing well with trazodone. Report not waking up and ease of sleep     Relevant past medical, surgical, family, and social history reviewed and updated as indicated.  Allergies and medications reviewed and updated. Data reviewed: Chart in Epic.   Past Medical History:  Diagnosis Date   Anxiety    Arthritis    osteoarthritis   Asthma    CKD (chronic kidney disease) stage 3, GFR 30-59 ml/min (HCC)    Diabetes mellitus without complication (HCC)    Hypercholesteremia    Hypertension     Past Surgical History:  Procedure Laterality Date   CATARACT EXTRACTION W/PHACO Left 08/18/2016   Procedure: CATARACT EXTRACTION PHACO AND INTRAOCULAR LENS PLACEMENT (IOC);  Surgeon: Jethro Bolus, MD;  Location: [facility];  Service: Ophthalmology;  Laterality: Left;  CDE: 6.05   CATARACT EXTRACTION W/PHACO Left 09/01/2016   Procedure: REPOSITIONING OF LEFT IOL LENS;  Surgeon: Jethro Bolus, MD;  Location: [facility];  Service: Ophthalmology;  Laterality: Left;   CATARACT EXTRACTION W/PHACO Right 10/06/2016   Procedure: CATARACT EXTRACTION PHACO AND INTRAOCULAR LENS PLACEMENT (IOC);  Surgeon: Jethro Bolus, MD;  Location: [facility];  Service: Ophthalmology;  Laterality: Right;  CDE: 6.21    Social History   Socioeconomic History   Marital status: Married    Spouse name: Dorene Sorrow   Number of children: 0   Years of education: Not on file   Highest education  level: 12th grade  Occupational History   Occupation: retired  Tobacco Use   Smoking status: Former    Packs/day: 0.25    Years: 20.00    Additional pack years: 0.00    Total pack years: 5.00    Types: Cigarettes    Quit date: 08/13/1980    Years since quitting: 42.1   Smokeless  tobacco: Never  Vaping Use   Vaping Use: Never used  Substance and Sexual Activity   Alcohol use: No   Drug use: No   Sexual activity: Yes    Birth control/protection: Post-menopausal  Other Topics Concern   Not on file  Social History Narrative   Lives home with her husband. Works out at [employer] 60 min 3x per week and walks 30 minutes other days.   Sister lives in [place]   Social Determinants of Health   Financial Resource Strain: Medium Risk (10/03/2022)   Overall Financial Resource Strain (CARDIA)    Difficulty of Paying Living Expenses: Somewhat hard  Food Insecurity: Food Insecurity Present (10/03/2022)   Hunger Vital Sign    Worried About Running Out of Food in the Last Year: Never true    Ran Out of Food in the Last Year: Sometimes true  Transportation Needs: No Transportation Needs (10/03/2022)   PRAPARE - Administrator, Civil Service (Medical): No    Lack of Transportation (Non-Medical): No  Physical Activity: Sufficiently Active (10/03/2022)   Exercise Vital Sign    Days of Exercise per Week: 3 days    Minutes of Exercise per Session: 60 min  Stress: No Stress Concern Present (10/03/2022)   [employer] of Occupational Health - Occupational Stress Questionnaire    Feeling of Stress : Only a little  Social Connections: Moderately Integrated (10/03/2022)   Social Connection and Isolation Panel [NHANES]    Frequency of Communication with Friends and Family: Twice a week    Frequency of Social Gatherings with Friends and Family: Once a week    Attends Religious Services: More than 4 times per year    Active Member of [employer] or Organizations: No    Attends Banker Meetings: Not on file    Marital Status: Married  Catering manager Violence: Not At Risk (10/06/2021)   Humiliation, Afraid, Rape, and Kick questionnaire    Fear of Current or Ex-Partner: No    Emotionally Abused: No    Physically Abused: No    Sexually Abused: No    Outpatient Encounter  Medications as of 10/07/2022  Medication Sig   acetaminophen (TYLENOL) 325 MG tablet Take 2 tablets (650 mg total) by mouth every 6 (six) hours as needed for mild pain (or Fever >/= 101).   albuterol (PROVENTIL) (2.5 MG/3ML) 0.083% nebulizer solution Take 3 mLs (2.5 mg total) by nebulization every 2 (two) hours as needed for wheezing or shortness of breath.   albuterol (VENTOLIN HFA) 108 (90 Base) MCG/ACT inhaler Inhale 2 puffs into the lungs every 4 (four) hours as needed for wheezing or shortness of breath.   amLODipine (NORVASC) 10 MG tablet Take 1 tablet (10 mg total) by mouth daily.   citalopram (CELEXA) 40 MG tablet Take 1 tablet (40 mg total) by mouth daily.   diphenhydrAMINE (BENADRYL) 25 MG tablet Take 25 mg by mouth at bedtime as needed (for allergies.).   fluticasone (FLONASE) 50 MCG/ACT nasal spray Use 1 spray(s) in each nostril twice daily   glipiZIDE (GLUCOTROL) 10 MG tablet Take 1 tablet (10 mg total)  by mouth daily before breakfast.   L-Lysine 500 MG CAPS Take 1 capsule by mouth daily.   lisinopril-hydrochlorothiazide (ZESTORETIC) 20-25 MG tablet Take 1 tablet by mouth once daily   lovastatin (MEVACOR) 40 MG tablet Take 1 tablet (40 mg total) by mouth at bedtime.   metFORMIN (GLUCOPHAGE) 500 MG tablet Take 1 tablet (500 mg total) by mouth 2 (two) times daily with a meal.   Semaglutide (RYBELSUS) 14 MG TABS Take 1 tablet (14 mg total) by mouth daily.   traZODone (DESYREL) 50 MG tablet Take 1 tablet (50 mg total) by mouth at bedtime as needed. for sleep   triamcinolone cream (KENALOG) 0.1 % Apply 1 Application topically 2 (two) times daily.   No facility-administered encounter medications on file as of 10/07/2022.    Allergies  Allergen Reactions   Farxiga [Dapagliflozin]     Yeast infections   Sulfa Antibiotics Hives and Itching    welts    Review of Systems  Constitutional: Negative.  Negative for activity change, appetite change, chills, fatigue and fever.  HENT:  Negative.    Eyes: Negative.  Negative for blurred vision.  Respiratory: Negative.    Cardiovascular: Negative.  Negative for chest pain.  Gastrointestinal: Negative.   Endocrine: Negative.  Negative for polydipsia, polyphagia and polyuria.  Genitourinary: Negative.   Musculoskeletal: Negative.   Skin: Negative.  Negative for wound.  Allergic/Immunologic: Negative.   Neurological: Negative.  Negative for weakness.  Hematological: Negative.   Psychiatric/Behavioral:  Positive for sleep disturbance.   All other systems reviewed and are negative.       Objective:  BP 135/71   Pulse 95   Temp 98 F (36.7 C) (Temporal)   Ht 5\' 3"  (1.6 m)   Wt 174 lb 9.6 oz (79.2 kg)   SpO2 93%   BMI 30.93 kg/m    Wt Readings from Last 3 Encounters:  10/07/22 174 lb 9.6 oz (79.2 kg)  07/07/22 174 lb 9.6 oz (79.2 kg)  04/03/22 172 lb 3.2 oz (78.1 kg)    Physical Exam Vitals and nursing note reviewed.  Constitutional:      Appearance: Normal appearance. She is obese.  HENT:     Head: Normocephalic and atraumatic.     Nose: Nose normal.     Mouth/Throat:     Mouth: Mucous membranes are moist.  Eyes:     Extraocular Movements: Extraocular movements intact.     Conjunctiva/sclera: Conjunctivae normal.     Pupils: Pupils are equal, round, and reactive to light.  Cardiovascular:     Rate and Rhythm: Normal rate and regular rhythm.     Pulses: Normal pulses.     Heart sounds: Normal heart sounds.  Pulmonary:     Effort: Pulmonary effort is normal.     Breath sounds: Normal breath sounds.  Abdominal:     General: Bowel sounds are normal.     Palpations: Abdomen is soft.  Genitourinary:    General: Normal vulva.     Rectum: Normal.  Musculoskeletal:        General: Normal range of motion.     Cervical back: Normal range of motion.  Skin:    General: Skin is warm.     Capillary Refill: Capillary refill takes less than 2 seconds.  Neurological:     General: No focal deficit present.      Mental Status: She is alert and oriented to person, place, and time.  Psychiatric:        Mood  and Affect: Mood normal.        Behavior: Behavior normal.        Thought Content: Thought content normal.        Judgment: Judgment normal.     Results for orders placed or performed in visit on 07/07/22  CMP14+EGFR  Result Value Ref Range   Glucose 109 (H) 70 - 99 mg/dL   BUN 17 8 - 27 mg/dL   Creatinine, Ser 1.61 (H) 0.57 - 1.00 mg/dL   eGFR 42 (L) >09 UE/AVW/0.98   BUN/Creatinine Ratio 13 12 - 28   Sodium 138 134 - 144 mmol/L   Potassium 4.3 3.5 - 5.2 mmol/L   Chloride 100 96 - 106 mmol/L   CO2 23 20 - 29 mmol/L   Calcium 9.5 8.7 - 10.3 mg/dL   Total Protein 6.5 6.0 - 8.5 g/dL   Albumin 4.3 3.8 - 4.8 g/dL   Globulin, Total 2.2 1.5 - 4.5 g/dL   Albumin/Globulin Ratio 2.0 1.2 - 2.2   Bilirubin Total 0.5 0.0 - 1.2 mg/dL   Alkaline Phosphatase 98 44 - 121 IU/L   AST 9 0 - 40 IU/L   ALT 6 0 - 32 IU/L  Bayer DCA Hb A1c Waived  Result Value Ref Range   HB A1C (BAYER DCA - WAIVED) 6.4 (H) 4.8 - 5.6 %  Vitamin B12  Result Value Ref Range   Vitamin B-12 >2000 (H) 232 - 1245 pg/mL       Pertinent labs & imaging results that were available during my care of the patient were reviewed by me and considered in my medical decision making.  Assessment & Plan:  Zayneb was seen today for diabetes.  Diagnoses and all orders for this visit:  Type 2 diabetes mellitus with hyperglycemia, without long-term current use of insulin (HCC) A1C 6.4 in office today, great job. Continue current regimen. Diet and exercise encouraged.  Continue lovastatin (MEVACOR) 40 MG tablet; Take 1 tablet (40 mg total) by mouth at bedtime. Continue  Semaglutide (RYBELSUS) 14 MG TABS; Take 1 tablet (14 mg total) by mouth daily. -Continue Metformin 500mg  PO daily  -Continue Semeglitude 14mg  POdaily -Continue Glipizide 10mg  PO Daily (evaluate to d/c next visit) -Lab draw B12  -lab draw A1C  Hypertension  associated with type 2 diabetes mellitus (HCC) BP fairly well controlled. No changes made in regimen today. Goal BP is 130/80. Pt aware to report any persistent high or low readings. DASH diet and exercise encouraged. Continue to exercise at least 150 minutes per week and increase as tolerated. Goal BMI > 25. Stress management encouraged. Avoid nicotine and tobacco product use. Avoid excessive alcohol and NSAID's. Avoid more than 2000 mg of sodium daily. Medications as prescribed. Follow up as scheduled.  -Lab draw today CMP14+EGFR -Continue  amLODipine (NORVASC) 10 MG tablet; Take 1 tablet (10 mg total) by mouth daily. -Continue Lisinopril/HCTZ 20/25mg  PO daily   Hyperlipidemia associated with type 2 diabetes mellitus (HCC) Diet encouraged - increase intake of fresh fruits and vegetables, increase intake of lean proteins. Bake, broil, or grill foods. Avoid fried, greasy, and fatty foods. Avoid fast foods. Increase intake of fiber-rich whole grains. Exercise encouraged - at least 150 minutes per week and advance as tolerated.  Goal BMI < 25. Continue medications as prescribed. Follow up in 3-6 months as discussed.  -lovastatin (MEVACOR) 40 MG tablet; Take 1 tablet (40 mg total) by mouth at bedtime.   Anxiety Doing well on below, will continue.  -citalopram (  CELEXA) 40 MG tablet; Take 1 tablet (40 mg total) by mouth daily.   Difficulty sleeping Doing well on below, will continue.  -TraZODone (DESYREL) 50 MG tablet; Take 1 tablet (50 mg total) by mouth at bedtime as needed. for sleep      Continue all other maintenance medications.  Follow up plan: Return in about 3 months (around 01/07/2023) for DM. Next visit assess and evaluate  for d/c of glipizide    Continue healthy lifestyle choices, including diet (rich in fruits, vegetables, and lean proteins, and low in salt and simple carbohydrates) and exercise (at least 30 minutes of moderate physical activity daily).  Educational handout given  for DM HTN DASH HLD  The above assessment and management plan was discussed with the patient. The patient verbalized understanding of and has agreed to the management plan. Patient is aware to call the clinic if they develop any new symptoms or if symptoms persist or worsen. Patient is aware when to return to the clinic for a follow-up visit. Patient educated on when it is appropriate to go to the emergency department.   Maryelizabeth Kaufmann NP student [facility] [phone]   I personally was present during the history, physical exam, and medical decision-making activities of this visit and have verified that the services and findings are accurately documented in the nurse practitioner student's note.  Kari Baars, FNP-C [facility] [address] [place], [place] [address] [phone]

## 2022-10-07 NOTE — Patient Instructions (Signed)

## 2022-10-08 LAB — CMP14+EGFR
ALT: 6 IU/L (ref 0–32)
AST: 10 IU/L (ref 0–40)
Albumin/Globulin Ratio: 1.8
Albumin: 4 g/dL (ref 3.8–4.8)
Alkaline Phosphatase: 110 IU/L (ref 44–121)
BUN/Creatinine Ratio: 10 — ABNORMAL LOW (ref 12–28)
BUN: 15 mg/dL (ref 8–27)
Bilirubin Total: 0.6 mg/dL (ref 0.0–1.2)
CO2: 21 mmol/L (ref 20–29)
Calcium: 9.4 mg/dL (ref 8.7–10.3)
Chloride: 99 mmol/L (ref 96–106)
Creatinine, Ser: 1.44 mg/dL — ABNORMAL HIGH (ref 0.57–1.00)
Globulin, Total: 2.2 g/dL (ref 1.5–4.5)
Glucose: 199 mg/dL — ABNORMAL HIGH (ref 70–99)
Potassium: 4.1 mmol/L (ref 3.5–5.2)
Sodium: 137 mmol/L (ref 134–144)
Total Protein: 6.2 g/dL (ref 6.0–8.5)
eGFR: 39 mL/min/{1.73_m2} — ABNORMAL LOW (ref >59)

## 2022-10-08 LAB — VITAMIN B12: Vitamin B-12: >2000 pg/mL — ABNORMAL HIGH (ref 232–1245)

## 2022-10-08 NOTE — Addendum Note (Signed)
Addended by: Sonny Masters on: 10/08/2022 01:29 PM   Modules accepted: Orders

## 2022-10-12 ENCOUNTER — Encounter: Payer: Self-pay | Admitting: Family Medicine

## 2022-10-24 ENCOUNTER — Other Ambulatory Visit: Payer: Self-pay | Admitting: Nephrology

## 2022-10-24 DIAGNOSIS — I129 Hypertensive chronic kidney disease with stage 1 through stage 4 chronic kidney disease, or unspecified chronic kidney disease: Secondary | ICD-10-CM | POA: Diagnosis not present

## 2022-10-24 DIAGNOSIS — R809 Proteinuria, unspecified: Secondary | ICD-10-CM | POA: Diagnosis not present

## 2022-10-24 DIAGNOSIS — N183 Type 2 diabetes mellitus with diabetic chronic kidney disease: Secondary | ICD-10-CM

## 2022-10-24 DIAGNOSIS — E785 Hyperlipidemia, unspecified: Secondary | ICD-10-CM | POA: Diagnosis not present

## 2022-10-24 DIAGNOSIS — E1129 Type 2 diabetes mellitus with other diabetic kidney complication: Secondary | ICD-10-CM | POA: Diagnosis not present

## 2022-10-28 ENCOUNTER — Encounter: Payer: Self-pay | Admitting: Family Medicine

## 2022-11-02 ENCOUNTER — Other Ambulatory Visit: Payer: Self-pay | Admitting: Family Medicine

## 2022-11-02 DIAGNOSIS — J0110 Acute frontal sinusitis, unspecified: Secondary | ICD-10-CM

## 2022-11-05 ENCOUNTER — Ambulatory Visit: Payer: Medicare Other

## 2022-11-05 VITALS — Ht 63.0 in | Wt 174.0 lb

## 2022-11-05 DIAGNOSIS — Z0000 Encounter for general adult medical examination without abnormal findings: Secondary | ICD-10-CM | POA: Diagnosis not present

## 2022-11-05 DIAGNOSIS — E1129 Type 2 diabetes mellitus with other diabetic kidney complication: Secondary | ICD-10-CM | POA: Diagnosis not present

## 2022-11-05 DIAGNOSIS — R809 Proteinuria, unspecified: Secondary | ICD-10-CM | POA: Diagnosis not present

## 2022-11-05 DIAGNOSIS — E785 Hyperlipidemia, unspecified: Secondary | ICD-10-CM | POA: Diagnosis not present

## 2022-11-05 DIAGNOSIS — E1122 Type 2 diabetes mellitus with diabetic chronic kidney disease: Secondary | ICD-10-CM | POA: Diagnosis not present

## 2022-11-05 DIAGNOSIS — N189 Chronic kidney disease, unspecified: Secondary | ICD-10-CM | POA: Diagnosis not present

## 2022-11-05 DIAGNOSIS — I129 Hypertensive chronic kidney disease with stage 1 through stage 4 chronic kidney disease, or unspecified chronic kidney disease: Secondary | ICD-10-CM | POA: Diagnosis not present

## 2022-11-05 NOTE — Progress Notes (Signed)
Subjective:   Jeanne Medina is a 72 y.o. female who presents for Medicare Annual (Subsequent) preventive examination.  Visit Complete: Virtual  I connected with  Jeanne Medina on 11/05/22 by a audio enabled telemedicine application and verified that I am speaking with the correct person using two identifiers.  Patient Location: Home  Provider Location: Home Office  I discussed the limitations of evaluation and management by telemedicine. The patient expressed understanding and agreed to proceed.  Patient Medicare AWV questionnaire was completed by the patient on 11/05/2022; I have confirmed that all information answered by patient is correct and no changes since this date.  Review of Systems    Nutrition Risk Assessment:  Has the patient had any N/V/D within the last 2 months?  No  Does the patient have any non-healing wounds?  No  Has the patient had any unintentional weight loss or weight gain?  No   Diabetes:  Is the patient diabetic?  Yes  If diabetic, was a CBG obtained today?  No  Did the patient bring in their glucometer from home?  No  How often do you monitor your CBG's? 2 x day .   Financial Strains and Diabetes Management:  Are you having any financial strains with the device, your supplies or your medication? No .  Does the patient want to be seen by Chronic Care Management for management of their diabetes?  No  Would the patient like to be referred to a Nutritionist or for Diabetic Management?  No   Diabetic Exams:  Diabetic Eye Exam: Completed 04/2022 Diabetic Medina Exam: Overdue, Pt has been advised about the importance in completing this exam. Pt is scheduled for diabetic Medina exam on next office visit .        Objective:    Today's Vitals   11/05/22 0912  Weight: 174 lb (78.9 kg)  Height: 5\' 3"  (1.6 m)   Body mass index is 30.82 kg/m.     11/05/2022    9:16 AM 10/06/2021   11:17 AM 03/18/2021    4:00 PM 03/17/2021    7:46 AM 10/03/2020    12:50 PM 10/03/2019    2:21 PM 10/06/2016    7:20 AM  Advanced Directives  Does Patient Have a Medical Advance Directive? Yes Yes No Yes Yes Yes No  Type of Estate agent of [place];Living will Healthcare Power of [place];Living will Healthcare Power of [employer] Power of [employer] Power of [place];Living will Healthcare Power of Attorney   Does patient want to make changes to medical advance directive? No - Patient declined  No - Patient declined No - Patient declined  No - Patient declined   Copy of Healthcare Power of Attorney in Chart? Yes - validated most recent copy scanned in chart (See row information) Yes - validated most recent copy scanned in chart (See row information) Yes - validated most recent copy scanned in chart (See row information) Yes - validated most recent copy scanned in chart (See row information) No - copy requested No - copy requested   Would patient like information on creating a medical advance directive?   No - Patient declined        Current Medications (verified) Outpatient Encounter Medications as of 11/05/2022  Medication Sig   acetaminophen (TYLENOL) 325 MG tablet Take 2 tablets (650 mg total) by mouth every 6 (six) hours as needed for mild pain (or Fever >/= 101).   albuterol (PROVENTIL) (2.5 MG/3ML) 0.083% nebulizer  solution Take 3 mLs (2.5 mg total) by nebulization every 2 (two) hours as needed for wheezing or shortness of breath.   albuterol (VENTOLIN HFA) 108 (90 Base) MCG/ACT inhaler Inhale 2 puffs into the lungs every 4 (four) hours as needed for wheezing or shortness of breath.   amLODipine (NORVASC) 10 MG tablet Take 1 tablet (10 mg total) by mouth daily.   citalopram (CELEXA) 40 MG tablet Take 1 tablet (40 mg total) by mouth daily.   diphenhydrAMINE (BENADRYL) 25 MG tablet Take 25 mg by mouth at bedtime as needed (for allergies.).   fluticasone (FLONASE) 50 MCG/ACT nasal spray Use 1 spray(s) in each nostril twice  daily   glipiZIDE (GLUCOTROL) 10 MG tablet Take 1 tablet (10 mg total) by mouth daily before breakfast.   L-Lysine 500 MG CAPS Take 1 capsule by mouth daily.   lisinopril-hydrochlorothiazide (ZESTORETIC) 20-25 MG tablet Take 1 tablet by mouth once daily   lovastatin (MEVACOR) 40 MG tablet Take 1 tablet (40 mg total) by mouth at bedtime.   metFORMIN (GLUCOPHAGE) 500 MG tablet Take 1 tablet (500 mg total) by mouth 2 (two) times daily with a meal.   Semaglutide (RYBELSUS) 14 MG TABS Take 1 tablet (14 mg total) by mouth daily.   traZODone (DESYREL) 50 MG tablet Take 1 tablet (50 mg total) by mouth at bedtime as needed. for sleep   triamcinolone cream (KENALOG) 0.1 % Apply 1 Application topically 2 (two) times daily.   No facility-administered encounter medications on file as of 11/05/2022.    Allergies (verified) Farxiga [dapagliflozin] and Sulfa antibiotics   History: Past Medical History:  Diagnosis Date   Anxiety    Arthritis    osteoarthritis   Asthma    CKD (chronic kidney disease) stage 3, GFR 30-59 ml/min (HCC)    Diabetes mellitus without complication (HCC)    Hypercholesteremia    Hypertension    Past Surgical History:  Procedure Laterality Date   CATARACT EXTRACTION W/PHACO Left 08/18/2016   Procedure: CATARACT EXTRACTION PHACO AND INTRAOCULAR LENS PLACEMENT (IOC);  Surgeon: Jethro Bolus, MD;  Location: [facility];  Service: Ophthalmology;  Laterality: Left;  CDE: 6.05   CATARACT EXTRACTION W/PHACO Left 09/01/2016   Procedure: REPOSITIONING OF LEFT IOL LENS;  Surgeon: Jethro Bolus, MD;  Location: [facility];  Service: Ophthalmology;  Laterality: Left;   CATARACT EXTRACTION W/PHACO Right 10/06/2016   Procedure: CATARACT EXTRACTION PHACO AND INTRAOCULAR LENS PLACEMENT (IOC);  Surgeon: Jethro Bolus, MD;  Location: [facility];  Service: Ophthalmology;  Laterality: Right;  CDE: 6.21   Family History  Problem Relation Age of Onset   Heart failure Mother    Hypertension Mother    Heart  disease Mother    Stroke Father    Heart disease Father    Hypertension Father    Dementia Sister    Gallbladder disease Brother    Pancreatic cancer Brother    Heart attack Brother    Rheum arthritis Sister    Heart disease Sister    Cancer Sister        tonsils   Diabetes Sister    Diabetes Sister    Heart disease Sister    Rheum arthritis Sister    Social History   Socioeconomic History   Marital status: Married    Spouse name: Dorene Sorrow   Number of children: 0   Years of education: Not on file   Highest education level: 12th grade  Occupational History   Occupation: retired  Tobacco Use  Smoking status: Former    Current packs/day: 0.00    Average packs/day: 0.3 packs/day for 20.0 years (5.0 ttl pk-yrs)    Types: Cigarettes    Start date: 08/13/1960    Quit date: 08/13/1980    Years since quitting: 42.2   Smokeless tobacco: Never  Vaping Use   Vaping status: Never Used  Substance and Sexual Activity   Alcohol use: No   Drug use: No   Sexual activity: Yes    Birth control/protection: Post-menopausal  Other Topics Concern   Not on file  Social History Narrative   Lives home with her husband. Works out at [employer] 60 min 3x per week and walks 30 minutes other days.   Sister lives in [place]   Social Determinants of Health   Financial Resource Strain: Low Risk  (11/05/2022)   Overall Financial Resource Strain (CARDIA)    Difficulty of Paying Living Expenses: Not hard at all  Recent Concern: Financial Resource Strain - Medium Risk (10/03/2022)   Overall Financial Resource Strain (CARDIA)    Difficulty of Paying Living Expenses: Somewhat hard  Food Insecurity: No Food Insecurity (11/05/2022)   Hunger Vital Sign    Worried About Running Out of Food in the Last Year: Never true    Ran Out of Food in the Last Year: Never true  Recent Concern: Food Insecurity - Food Insecurity Present (10/03/2022)   Hunger Vital Sign    Worried About Running Out of Food in the Last Year:  Never true    Ran Out of Food in the Last Year: Sometimes true  Transportation Needs: No Transportation Needs (11/05/2022)   PRAPARE - Administrator, Civil Service (Medical): No    Lack of Transportation (Non-Medical): No  Physical Activity: Sufficiently Active (11/05/2022)   Exercise Vital Sign    Days of Exercise per Week: 3 days    Minutes of Exercise per Session: 60 min  Stress: No Stress Concern Present (11/05/2022)   [employer] of Occupational Health - Occupational Stress Questionnaire    Feeling of Stress : Not at all  Social Connections: Moderately Integrated (11/05/2022)   Social Connection and Isolation Panel [NHANES]    Frequency of Communication with Friends and Family: More than three times a week    Frequency of Social Gatherings with Friends and Family: More than three times a week    Attends Religious Services: More than 4 times per year    Active Member of [employer] or Organizations: No    Attends Engineer, structural: Never    Marital Status: Married    Tobacco Counseling Counseling given: Not Answered   Clinical Intake:  Pre-visit preparation completed: Yes  Pain : No/denies pain     Nutritional Risks: None Diabetes: Yes CBG done?: No Did pt. bring in CBG monitor from home?: No  How often do you need to have someone help you when you read instructions, pamphlets, or other written materials from your doctor or pharmacy?: 1 - Never  Interpreter Needed?: No  Information entered by :: Renie Ora, LPN   Activities of Daily Living    11/01/2022   12:41 PM 10/07/2022   10:53 AM  In your present state of health, do you have any difficulty performing the following activities:  Hearing? 0 0  Vision? 0   Difficulty concentrating or making decisions? 0 0  Walking or climbing stairs? 0 0  Dressing or bathing? 0 0  Doing errands, shopping? 0 0  Preparing Food  and eating ? N N  Using the Toilet? N N  In the past six months, have you  accidently leaked urine? N N  Do you have problems with loss of bowel control? N N  Managing your Medications? N N  Managing your Finances? N N  Housekeeping or managing your Housekeeping? N N    Patient Care Team: Sonny Masters, FNP as PCP - General (Family Medicine) Danella Maiers, [facility] (Pharmacist) Michaelle Copas, MD as Referring Physician (Optometry)  Indicate any recent Medical Services you may have received from other than [facility] providers in the past year (date may be approximate).     Assessment:   This is a routine wellness examination for Karron.  Hearing/Vision screen Vision Screening - Comments:: Wears rx glasses - up to date with routine eye exams with  Dr.Lee   Dietary issues and exercise activities discussed:     Goals Addressed             This Visit's Progress    Patient Stated   On track    10/06/2021 - continue going to [facility] - increase strength - maintain independence       Depression Screen    11/05/2022    9:15 AM 10/07/2022    8:02 AM 07/07/2022    8:18 AM 12/25/2021    9:10 AM 10/06/2021   11:35 AM 08/19/2021    8:04 AM 05/21/2021    7:59 AM  PHQ 2/9 Scores  PHQ - 2 Score 0 0 0 0 0 0 0  PHQ- 9 Score 0 0 1 0 0 1 0    Fall Risk    11/05/2022    9:13 AM 11/01/2022   12:41 PM 10/07/2022   10:53 AM 10/07/2022    8:02 AM 07/07/2022    8:18 AM  Fall Risk   Falls in the past year? 0 0 0 0 0  Number falls in past yr: 0      Injury with Fall? 0 0     Risk for fall due to : No Fall Risks      Follow up Falls prevention discussed        MEDICARE RISK AT HOME:  Medicare Risk at Home - 11/05/22 0914     Any stairs in or around the home? Yes    If so, are there any without handrails? No    Home free of loose throw rugs in walkways, pet beds, electrical cords, etc? Yes    Adequate lighting in your home to reduce risk of falls? Yes    Life alert? No    Use of a cane, walker or w/c? No    Grab bars in the bathroom? Yes    Shower chair or bench in  shower? Yes    Elevated toilet seat or a handicapped toilet? Yes             TIMED UP AND GO:  Was the test performed?  No    Cognitive Function:        11/05/2022    9:16 AM 10/06/2021   11:18 AM 10/03/2019    2:18 PM  6CIT Screen  What Year? 0 points 0 points 0 points  What month? 0 points 0 points 0 points  What time? 0 points 0 points 0 points  Count back from 20 0 points 0 points 0 points  Months in reverse 0 points 2 points 0 points  Repeat phrase 0 points 2 points  0 points  Total Score 0 points 4 points 0 points    Immunizations Immunization History  Administered Date(s) Administered   Influenza Split 02/10/2017   Influenza,inj,Quad PF,6+ Mos 01/26/2019   Influenza,inj,quad, With Preservative 01/28/2018   Influenza-Unspecified 03/14/2007, 02/09/2014, 02/08/2015, 01/28/2018, 02/02/2020, 02/13/2021   Pneumococcal Conjugate-13 02/09/2014   Pneumococcal Polysaccharide-23 05/14/2015   Td 05/09/1998   Tdap 06/11/2017   Zoster Recombinant(Shingrix) 02/02/2020, 07/11/2020   Zoster, Live 12/02/2012    TDAP status: Up to date  Flu Vaccine status: Up to date  Pneumococcal vaccine status: Up to date  Covid-19 vaccine status: Completed vaccines  Qualifies for Shingles Vaccine? Yes   Zostavax completed Yes   Shingrix Completed?: Yes  Screening Tests Health Maintenance  Topic Date Due   COVID-19 Vaccine (1 - 2023-24 season) 11/21/2022 (Originally 12/26/2021)   DEXA SCAN  03/05/2023 (Originally 03/26/2015)   INFLUENZA VACCINE  11/26/2022   MAMMOGRAM  01/10/2023   Diabetic kidney evaluation - Urine ACR  04/04/2023   Medina EXAM  04/04/2023   HEMOGLOBIN A1C  04/08/2023   OPHTHALMOLOGY EXAM  05/12/2023   Diabetic kidney evaluation - eGFR measurement  10/07/2023   Medicare Annual Wellness (AWV)  11/05/2023   Fecal DNA (Cologuard)  04/01/2024   DTaP/Tdap/Td (3 - Td or Tdap) 06/12/2027   Pneumonia Vaccine 20+ Years old  Completed   Hepatitis C Screening  Completed    Zoster Vaccines- Shingrix  Completed   HPV VACCINES  Aged Out    Health Maintenance  There are no preventive care reminders to display for this patient.   Colorectal cancer screening: Type of screening: Cologuard. Completed 04/01/2021. Repeat every 3 years  Mammogram status: Completed 01/09/2022. Repeat every year  Bone Density status: Ordered declined by patient . Pt provided with contact info and advised to call to schedule appt.  Lung Cancer Screening: (Low Dose CT Chest recommended if Age 42-80 years, 20 pack-year currently smoking OR have quit w/in 15years.) does not qualify.   Lung Cancer Screening Referral: n/a  Additional Screening:  Hepatitis C Screening: does not qualify; Completed 12/23/2017  Vision Screening: Recommended annual ophthalmology exams for early detection of glaucoma and other disorders of the eye. Is the patient up to date with their annual eye exam?  Yes  Who is the provider or what is the name of the office in which the patient attends annual eye exams? Dr.Lee  If pt is not established with a provider, would they like to be referred to a provider to establish care? No .   Dental Screening: Recommended annual dental exams for proper oral hygiene    Community Resource Referral / Chronic Care Management: CRR required this visit?  No   CCM required this visit?  No     Plan:     I have personally reviewed and noted the following in the patient's chart:   Medical and social history Use of alcohol, tobacco or illicit drugs  Current medications and supplements including opioid prescriptions. Patient is not currently taking opioid prescriptions. Functional ability and status Nutritional status Physical activity Advanced directives List of other physicians Hospitalizations, surgeries, and ER visits in previous 12 months Vitals Screenings to include cognitive, depression, and falls Referrals and appointments  In addition, I have reviewed and  discussed with patient certain preventive protocols, quality metrics, and best practice recommendations. A written personalized care plan for preventive services as well as general preventive health recommendations were provided to patient.     Lorrene Reid, LPN  11/05/2022   After Visit Summary: (MyChart) Due to this being a telephonic visit, the after visit summary with patients personalized plan was offered to patient via MyChart   Nurse Notes: none

## 2022-11-05 NOTE — Patient Instructions (Signed)
Jeanne Medina , Thank you for taking time to come for your Medicare Wellness Visit. I appreciate your ongoing commitment to your health goals. Please review the following plan we discussed and let me know if I can assist you in the future.   These are the goals we discussed:  Goals       Client will verbalize knowledge of diabetes self-management as evidenced by Hgb A1C <7 or as defined by provider.      Diabetes self management actions: Glucose monitoring per provider recommendations Perform Quality checks on blood meter Eat Healthy Check feet daily Visit provider every 3-6 months as directed Hbg A1C level every 3-6 months. Eye Exam yearly      Patient Stated      10/06/2021 - continue going to [facility] - increase strength - maintain independence      T2DM, HLD PHARMD GOAL (pt-stated)      Current Barriers:  Unable to independently afford treatment regimen Unable to maintain control of T2DM-patient ran out of medicine due to cost  Pharmacist Clinical Goal(s):  patient will verbalize ability to afford treatment regimen maintain control of T2DM as evidenced by GOAL A1C<7%, IMPROVED GLYCEMIC  CONTROL  through collaboration with PharmD and provider.    Interventions: 1:1 collaboration with Gwenlyn Fudge, FNP regarding development and update of comprehensive plan of care as evidenced by provider attestation and co-signature Inter-disciplinary care team collaboration (see longitudinal plan of care) Comprehensive medication review performed; medication list updated in electronic medical record  Diabetes: Goal on Track (progressing): YES. 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, however patient continues to have yeast infections  Denies personal and family history of Medullary thyroid cancer (MTC) CONTINUE DECREASE METFORMIN TO 500MG  (1 TAB) TWICE DAILY--A1C CONTROLLED & WE ARE  WATCHING GFR 60-->47 (CKD3A) Added back glipizide to see if any benefit CONTINUE RYBELSUS 14mg  daily--consider transitioning to Ozempic for additional glycemic control TOLERATING WELL; DENIES SIDE EFFECTS APPLICATION APPROVED FOR [facility] PATIENT ASSISTANCE PROGRAM Current glucose readings: fasting glucose: <150, post prandial glucose: N/A DENIES hypoglycemic/hyperglycemic symptoms Discussed meal planning options and Plate method for healthy eating Avoid sugary drinks and desserts Incorporate balanced protein, non starchy veggies, 1 serving of carbohydrate with each meal Increase water intake Increase physical activity as able Current exercise: N/A Counseled on MEDICATIONS--PURPOSE & SIDE EFFECTS Collaborated with PCP--DECREASED METFORMIN Assessed patient finances. APPLICATION APPROVED FOR RYBELSUS [facility] PATIENT ASSISTANCE-->REFILLS SENT TODAY VIA FAX  Hyperlipidemia:  Goal on Track (progressing): YES. controlled; current treatment: LOVASTATIN;  Medications previously tried: N/A  Current dietary patterns: ENCOURAGED HEART HEALTHY DIET, DECREASE SUGAR AND FATTY FOODS TO HELP WITH TRIGLYCERIDES Counseled on DIET-HEART HEALTHY   Lipid Panel     Component Value Date/Time   CHOL 157 08/19/2021 0818   TRIG 137 08/19/2021 0818   HDL 71 08/19/2021 0818   CHOLHDL 2.2 08/19/2021 0818   LDLCALC 63 08/19/2021 0818   LABVLDL 23 08/19/2021 0818     Patient Goals/Self-Care Activities patient will:  - take medications as prescribed as evidenced by patient report and record review check glucose DAILY FASTING, document, and provide at future appointments collaborate with provider on medication access solutions target a minimum of 150 minutes of moderate intensity exercise weekly engage in dietary modifications by FOLLOWING HEART HEALTHY DIET         This is a list of the screening recommended for you and due dates:  Health  Maintenance  Topic Date Due   COVID-19 Vaccine (1  - 2023-24 season) 11/21/2022*   DEXA scan (bone density measurement)  03/05/2023*   Flu Shot  11/26/2022   Mammogram  01/10/2023   Yearly kidney health urinalysis for diabetes  04/04/2023   Complete foot exam   04/04/2023   Hemoglobin A1C  04/08/2023   Eye exam for diabetics  05/12/2023   Yearly kidney function blood test for diabetes  10/07/2023   Medicare Annual Wellness Visit  11/05/2023   Cologuard (Stool DNA test)  04/01/2024   DTaP/Tdap/Td vaccine (3 - Td or Tdap) 06/12/2027   Pneumonia Vaccine  Completed   Hepatitis C Screening  Completed   Zoster (Shingles) Vaccine  Completed   HPV Vaccine  Aged Out  *Topic was postponed. The date shown is not the original due date.    Advanced directives: In chart   Conditions/risks identified: Aim for 30 minutes of exercise or brisk walking, 6-8 glasses of water, and 5 servings of fruits and vegetables each day.   Next appointment: Follow up in one year for your annual wellness visit    Preventive Care 65 Years and Older, Female Preventive care refers to lifestyle choices and visits with your health care provider that can promote health and wellness. What does preventive care include? A yearly physical exam. This is also called an annual well check. Dental exams once or twice a year. Routine eye exams. Ask your health care provider how often you should have your eyes checked. Personal lifestyle choices, including: Daily care of your teeth and gums. Regular physical activity. Eating a healthy diet. Avoiding tobacco and drug use. Limiting alcohol use. Practicing safe sex. Taking low-dose aspirin every day. Taking vitamin and mineral supplements as recommended by your health care provider. What happens during an annual well check? The services and screenings done by your health care provider during your annual well check will depend on your age, overall health, lifestyle risk factors, and family history of disease. Counseling  Your  health care provider may ask you questions about your: Alcohol use. Tobacco use. Drug use. Emotional well-being. Home and relationship well-being. Sexual activity. Eating habits. History of falls. Memory and ability to understand (cognition). Work and work Astronomer. Reproductive health. Screening  You may have the following tests or measurements: Height, weight, and BMI. Blood pressure. Lipid and cholesterol levels. These may be checked every 5 years, or more frequently if you are over 62 years old. Skin check. Lung cancer screening. You may have this screening every year starting at age 22 if you have a 30-pack-year history of smoking and currently smoke or have quit within the past 15 years. Fecal occult blood test (FOBT) of the stool. You may have this test every year starting at age 41. Flexible sigmoidoscopy or colonoscopy. You may have a sigmoidoscopy every 5 years or a colonoscopy every 10 years starting at age 34. Hepatitis C blood test. Hepatitis B blood test. Sexually transmitted disease (STD) testing. Diabetes screening. This is done by checking your blood sugar (glucose) after you have not eaten for a while (fasting). You may have this done every 1-3 years. Bone density scan. This is done to screen for osteoporosis. You may have this done starting at age 75. Mammogram. This may be done every 1-2 years. Talk to your health care provider about how often you should have regular mammograms. Talk with your health care provider about your test results, treatment options, and if necessary, the need for  more tests. Vaccines  Your health care provider may recommend certain vaccines, such as: Influenza vaccine. This is recommended every year. Tetanus, diphtheria, and acellular pertussis (Tdap, Td) vaccine. You may need a Td booster every 10 years. Zoster vaccine. You may need this after age 88. Pneumococcal 13-valent conjugate (PCV13) vaccine. One dose is recommended after age  38. Pneumococcal polysaccharide (PPSV23) vaccine. One dose is recommended after age 73. Talk to your health care provider about which screenings and vaccines you need and how often you need them. This information is not intended to replace advice given to you by your health care provider. Make sure you discuss any questions you have with your health care provider. Document Released: 05/10/2015 Document Revised: 01/01/2016 Document Reviewed: 02/12/2015 Elsevier Interactive Patient Education  2017 [employer].  Fall Prevention in the Home Falls can cause injuries. They can happen to people of all ages. There are many things you can do to make your home safe and to help prevent falls. What can I do on the outside of my home? Regularly fix the edges of walkways and driveways and fix any cracks. Remove anything that might make you trip as you walk through a door, such as a raised step or threshold. Trim any bushes or trees on the path to your home. Use bright outdoor lighting. Clear any walking paths of anything that might make someone trip, such as rocks or tools. Regularly check to see if handrails are loose or broken. Make sure that both sides of any steps have handrails. Any raised decks and porches should have guardrails on the edges. Have any leaves, snow, or ice cleared regularly. Use sand or salt on walking paths during winter. Clean up any spills in your garage right away. This includes oil or grease spills. What can I do in the bathroom? Use night lights. Install grab bars by the toilet and in the tub and shower. Do not use towel bars as grab bars. Use non-skid mats or decals in the tub or shower. If you need to sit down in the shower, use a plastic, non-slip stool. Keep the floor dry. Clean up any water that spills on the floor as soon as it happens. Remove soap buildup in the tub or shower regularly. Attach bath mats securely with double-sided non-slip rug tape. Do not have throw  rugs and other things on the floor that can make you trip. What can I do in the bedroom? Use night lights. Make sure that you have a light by your bed that is easy to reach. Do not use any sheets or blankets that are too big for your bed. They should not hang down onto the floor. Have a firm chair that has side arms. You can use this for support while you get dressed. Do not have throw rugs and other things on the floor that can make you trip. What can I do in the kitchen? Clean up any spills right away. Avoid walking on wet floors. Keep items that you use a lot in easy-to-reach places. If you need to reach something above you, use a strong step stool that has a grab bar. Keep electrical cords out of the way. Do not use floor polish or wax that makes floors slippery. If you must use wax, use non-skid floor wax. Do not have throw rugs and other things on the floor that can make you trip. What can I do with my stairs? Do not leave any items on the stairs. Make  sure that there are handrails on both sides of the stairs and use them. Fix handrails that are broken or loose. Make sure that handrails are as long as the stairways. Check any carpeting to make sure that it is firmly attached to the stairs. Fix any carpet that is loose or worn. Avoid having throw rugs at the top or bottom of the stairs. If you do have throw rugs, attach them to the floor with carpet tape. Make sure that you have a light switch at the top of the stairs and the bottom of the stairs. If you do not have them, ask someone to add them for you. What else can I do to help prevent falls? Wear shoes that: Do not have high heels. Have rubber bottoms. Are comfortable and fit you well. Are closed at the toe. Do not wear sandals. If you use a stepladder: Make sure that it is fully opened. Do not climb a closed stepladder. Make sure that both sides of the stepladder are locked into place. Ask someone to hold it for you, if  possible. Clearly mark and make sure that you can see: Any grab bars or handrails. First and last steps. Where the edge of each step is. Use tools that help you move around (mobility aids) if they are needed. These include: Canes. Walkers. Scooters. Crutches. Turn on the lights when you go into a dark area. Replace any light bulbs as soon as they burn out. Set up your furniture so you have a clear path. Avoid moving your furniture around. If any of your floors are uneven, fix them. If there are any pets around you, be aware of where they are. Review your medicines with your doctor. Some medicines can make you feel dizzy. This can increase your chance of falling. Ask your doctor what other things that you can do to help prevent falls. This information is not intended to replace advice given to you by your health care provider. Make sure you discuss any questions you have with your health care provider. Document Released: 02/07/2009 Document Revised: 09/19/2015 Document Reviewed: 05/18/2014 Elsevier Interactive Patient Education  2017 [employer].

## 2022-11-09 ENCOUNTER — Ambulatory Visit (HOSPITAL_COMMUNITY)
Admission: RE | Admit: 2022-11-09 | Discharge: 2022-11-09 | Disposition: A | Discharge status: Home / Self Care | Payer: Medicare Other | Source: Ambulatory Visit | Attending: Nephrology | Admitting: Nephrology

## 2022-11-09 DIAGNOSIS — E1122 Type 2 diabetes mellitus with diabetic chronic kidney disease: Secondary | ICD-10-CM | POA: Diagnosis not present

## 2022-11-09 DIAGNOSIS — N189 Chronic kidney disease, unspecified: Secondary | ICD-10-CM | POA: Diagnosis not present

## 2022-11-09 DIAGNOSIS — N183 Chronic kidney disease, stage 3 unspecified: Secondary | ICD-10-CM | POA: Diagnosis not present

## 2022-11-17 DIAGNOSIS — E1129 Type 2 diabetes mellitus with other diabetic kidney complication: Secondary | ICD-10-CM | POA: Diagnosis not present

## 2022-11-17 DIAGNOSIS — E611 Iron deficiency: Secondary | ICD-10-CM | POA: Diagnosis not present

## 2022-11-17 DIAGNOSIS — E1122 Type 2 diabetes mellitus with diabetic chronic kidney disease: Secondary | ICD-10-CM | POA: Diagnosis not present

## 2022-11-17 DIAGNOSIS — R809 Proteinuria, unspecified: Secondary | ICD-10-CM | POA: Diagnosis not present

## 2023-01-02 ENCOUNTER — Other Ambulatory Visit: Payer: Self-pay | Admitting: Family Medicine

## 2023-01-02 DIAGNOSIS — I10 Essential (primary) hypertension: Secondary | ICD-10-CM

## 2023-01-08 ENCOUNTER — Other Ambulatory Visit: Payer: Self-pay | Admitting: Family Medicine

## 2023-01-08 ENCOUNTER — Other Ambulatory Visit (INDEPENDENT_AMBULATORY_CARE_PROVIDER_SITE_OTHER): Payer: Medicare Other

## 2023-01-08 ENCOUNTER — Telehealth: Payer: Self-pay | Admitting: Family Medicine

## 2023-01-08 ENCOUNTER — Encounter: Payer: Self-pay | Admitting: Family Medicine

## 2023-01-08 ENCOUNTER — Ambulatory Visit (INDEPENDENT_AMBULATORY_CARE_PROVIDER_SITE_OTHER): Payer: Medicare Other | Admitting: Family Medicine

## 2023-01-08 VITALS — BP 138/71 | HR 92 | Temp 97.8°F | Ht 63.0 in | Wt 172.8 lb

## 2023-01-08 DIAGNOSIS — N183 Chronic kidney disease, stage 3 unspecified: Secondary | ICD-10-CM

## 2023-01-08 DIAGNOSIS — D508 Other iron deficiency anemias: Secondary | ICD-10-CM

## 2023-01-08 DIAGNOSIS — E1159 Type 2 diabetes mellitus with other circulatory complications: Secondary | ICD-10-CM

## 2023-01-08 DIAGNOSIS — E1165 Type 2 diabetes mellitus with hyperglycemia: Secondary | ICD-10-CM | POA: Diagnosis not present

## 2023-01-08 DIAGNOSIS — E1169 Type 2 diabetes mellitus with other specified complication: Secondary | ICD-10-CM | POA: Diagnosis not present

## 2023-01-08 DIAGNOSIS — E1122 Type 2 diabetes mellitus with diabetic chronic kidney disease: Secondary | ICD-10-CM

## 2023-01-08 DIAGNOSIS — Z7984 Long term (current) use of oral hypoglycemic drugs: Secondary | ICD-10-CM

## 2023-01-08 DIAGNOSIS — Z23 Encounter for immunization: Secondary | ICD-10-CM | POA: Diagnosis not present

## 2023-01-08 DIAGNOSIS — E785 Hyperlipidemia, unspecified: Secondary | ICD-10-CM

## 2023-01-08 DIAGNOSIS — I152 Hypertension secondary to endocrine disorders: Secondary | ICD-10-CM

## 2023-01-08 DIAGNOSIS — Z780 Asymptomatic menopausal state: Secondary | ICD-10-CM

## 2023-01-08 DIAGNOSIS — E119 Type 2 diabetes mellitus without complications: Secondary | ICD-10-CM

## 2023-01-08 DIAGNOSIS — M810 Age-related osteoporosis without current pathological fracture: Secondary | ICD-10-CM | POA: Diagnosis not present

## 2023-01-08 LAB — BAYER DCA HB A1C WAIVED: HB A1C (BAYER DCA - WAIVED): 6.2 % — ABNORMAL HIGH (ref 4.8–5.6)

## 2023-01-08 NOTE — Telephone Encounter (Signed)
Patient has 2 months worth of Rybelsus and will need more. Wanted Jeanne Medina to know.

## 2023-01-08 NOTE — Progress Notes (Signed)
Subjective:  Patient ID: Jeanne Medina, female    DOB: 20-Oct-1950, 72 y.o.   MRN: [id number]  Patient Care Team: Sonny Masters, FNP as PCP - General (Family Medicine) Danella Maiers, [facility] (Pharmacist) Michaelle Copas, MD as Referring Physician (Optometry)   Chief Complaint:  Diabetes (3 month follow up )   HPI: Jeanne Medina is a 72 y.o. female presenting on 01/08/2023 for Diabetes (3 month follow up )   1. Type 2 diabetes mellitus with hyperglycemia, without long-term current use of insulin (HCC) Has been taking Rybelsus, Glipizide, and metformin. Unable to tolerate Jardiance/Farxiga due to yeast infections. Reports blood sugar this morning was 101. No polyuria, polyphagia, or polydipsia. She does report diarrhea over the last several months, not new or worsening.   2. Hypertension associated with type 2 diabetes mellitus (HCC) Complaint with meds - Yes Current Medications - lisinopril/hydrochlorothiazide, norvasc Checking BP at home - no Exercising Regularly - No Watching Salt intake - Yes Pertinent ROS:  Headache - No Fatigue - No Visual Disturbances - No Chest pain - No Dyspnea - No Palpitations - No LE edema - No They report good compliance with medications and can restate their regimen by memory. No medication side effects.  BP Readings from Last 3 Encounters:  01/08/23 138/71  10/07/22 135/71  07/07/22 (!) 146/74     3. Hyperlipidemia associated with type 2 diabetes mellitus (HCC) Compliant with medications - Yes Current medications - lovastatin Side effects from medications - No Diet - general Exercise - active daily  4. CKD stage 3 due to type 2 diabetes mellitus [facility]) She is followed by nephrology on a regular basis. Unable to tolerate Farxiga/Jardiance. Will stop metformin if A1C is acceptable.   5. IDA Recently started on iron repletion therapy and is tolerating well. No chest pain, shortness of breath, weakness, confusion, fatigue, visual  changes, or constipation. Has visit with nephrology for repeat labs next month.   Relevant past medical, surgical, family, and social history reviewed and updated as indicated.  Allergies and medications reviewed and updated. Data reviewed: Chart in Epic.   Past Medical History:  Diagnosis Date   Anxiety    Arthritis    osteoarthritis   Asthma    CKD (chronic kidney disease) stage 3, GFR 30-59 ml/min (HCC)    Diabetes mellitus without complication (HCC)    Hypercholesteremia    Hypertension     Past Surgical History:  Procedure Laterality Date   CATARACT EXTRACTION W/PHACO Left 08/18/2016   Procedure: CATARACT EXTRACTION PHACO AND INTRAOCULAR LENS PLACEMENT (IOC);  Surgeon: Jethro Bolus, MD;  Location: [facility];  Service: Ophthalmology;  Laterality: Left;  CDE: 6.05   CATARACT EXTRACTION W/PHACO Left 09/01/2016   Procedure: REPOSITIONING OF LEFT IOL LENS;  Surgeon: Jethro Bolus, MD;  Location: [facility];  Service: Ophthalmology;  Laterality: Left;   CATARACT EXTRACTION W/PHACO Right 10/06/2016   Procedure: CATARACT EXTRACTION PHACO AND INTRAOCULAR LENS PLACEMENT (IOC);  Surgeon: Jethro Bolus, MD;  Location: [facility];  Service: Ophthalmology;  Laterality: Right;  CDE: 6.21    Social History   Socioeconomic History   Marital status: Married    Spouse name: Dorene Sorrow   Number of children: 0   Years of education: Not on file   Highest education level: 12th grade  Occupational History   Occupation: retired  Tobacco Use   Smoking status: Former    Current packs/day: 0.00    Average packs/day: 0.3 packs/day for  20.0 years (5.0 ttl pk-yrs)    Types: Cigarettes    Start date: 08/13/1960    Quit date: 08/13/1980    Years since quitting: 42.4   Smokeless tobacco: Never  Vaping Use   Vaping status: Never Used  Substance and Sexual Activity   Alcohol use: No   Drug use: No   Sexual activity: Yes    Birth control/protection: Post-menopausal  Other Topics Concern   Not on file  Social  History Narrative   Lives home with her husband. Works out at [employer] 60 min 3x per week and walks 30 minutes other days.   Sister lives in [place]   Social Determinants of Health   Financial Resource Strain: Low Risk  (11/05/2022)   Overall Financial Resource Strain (CARDIA)    Difficulty of Paying Living Expenses: Not hard at all  Recent Concern: Financial Resource Strain - Medium Risk (10/03/2022)   Overall Financial Resource Strain (CARDIA)    Difficulty of Paying Living Expenses: Somewhat hard  Food Insecurity: No Food Insecurity (11/05/2022)   Hunger Vital Sign    Worried About Running Out of Food in the Last Year: Never true    Ran Out of Food in the Last Year: Never true  Recent Concern: Food Insecurity - Food Insecurity Present (10/03/2022)   Hunger Vital Sign    Worried About Running Out of Food in the Last Year: Never true    Ran Out of Food in the Last Year: Sometimes true  Transportation Needs: No Transportation Needs (11/05/2022)   PRAPARE - Administrator, Civil Service (Medical): No    Lack of Transportation (Non-Medical): No  Physical Activity: Sufficiently Active (11/05/2022)   Exercise Vital Sign    Days of Exercise per Week: 3 days    Minutes of Exercise per Session: 60 min  Stress: No Stress Concern Present (11/05/2022)   [employer] of Occupational Health - Occupational Stress Questionnaire    Feeling of Stress : Not at all  Social Connections: Moderately Integrated (11/05/2022)   Social Connection and Isolation Panel [NHANES]    Frequency of Communication with Friends and Family: More than three times a week    Frequency of Social Gatherings with Friends and Family: More than three times a week    Attends Religious Services: More than 4 times per year    Active Member of [employer] or Organizations: No    Attends Banker Meetings: Never    Marital Status: Married  Catering manager Violence: Not At Risk (11/05/2022)   Humiliation, Afraid, Rape,  and Kick questionnaire    Fear of Current or Ex-Partner: No    Emotionally Abused: No    Physically Abused: No    Sexually Abused: No    Outpatient Encounter Medications as of 01/08/2023  Medication Sig   acetaminophen (TYLENOL) 325 MG tablet Take 2 tablets (650 mg total) by mouth every 6 (six) hours as needed for mild pain (or Fever >/= 101).   albuterol (PROVENTIL) (2.5 MG/3ML) 0.083% nebulizer solution Take 3 mLs (2.5 mg total) by nebulization every 2 (two) hours as needed for wheezing or shortness of breath.   albuterol (VENTOLIN HFA) 108 (90 Base) MCG/ACT inhaler Inhale 2 puffs into the lungs every 4 (four) hours as needed for wheezing or shortness of breath.   amLODipine (NORVASC) 10 MG tablet Take 1 tablet (10 mg total) by mouth daily.   citalopram (CELEXA) 40 MG tablet Take 1 tablet (40 mg total) by mouth daily.  diphenhydrAMINE (BENADRYL) 25 MG tablet Take 25 mg by mouth at bedtime as needed (for allergies.).   fluticasone (FLONASE) 50 MCG/ACT nasal spray Use 1 spray(s) in each nostril twice daily   glipiZIDE (GLUCOTROL) 10 MG tablet Take 1 tablet (10 mg total) by mouth daily before breakfast.   L-Lysine 500 MG CAPS Take 1 capsule by mouth daily.   lisinopril-hydrochlorothiazide (ZESTORETIC) 20-25 MG tablet Take 1 tablet by mouth once daily   lovastatin (MEVACOR) 40 MG tablet Take 1 tablet (40 mg total) by mouth at bedtime.   Semaglutide (RYBELSUS) 14 MG TABS Take 1 tablet (14 mg total) by mouth daily.   traZODone (DESYREL) 50 MG tablet Take 1 tablet (50 mg total) by mouth at bedtime as needed. for sleep   triamcinolone cream (KENALOG) 0.1 % Apply 1 Application topically 2 (two) times daily.   [DISCONTINUED] metFORMIN (GLUCOPHAGE) 500 MG tablet Take 1 tablet (500 mg total) by mouth 2 (two) times daily with a meal.   No facility-administered encounter medications on file as of 01/08/2023.    Allergies  Allergen Reactions   Farxiga [Dapagliflozin]     Yeast infections   Sulfa  Antibiotics Hives and Itching    welts    Review of Systems  Constitutional:  Negative for activity change, appetite change, chills, diaphoresis, fatigue, fever and unexpected weight change.  HENT: Negative.    Eyes: Negative.  Negative for photophobia and visual disturbance.  Respiratory:  Negative for cough, chest tightness and shortness of breath.   Cardiovascular:  Negative for chest pain, palpitations and leg swelling.  Gastrointestinal:  Positive for diarrhea. Negative for abdominal distention, abdominal pain, anal bleeding, blood in stool, constipation, nausea, rectal pain and vomiting.  Endocrine: Negative.  Negative for cold intolerance, heat intolerance, polydipsia, polyphagia and polyuria.  Genitourinary:  Negative for decreased urine volume, difficulty urinating, dysuria, frequency and urgency.  Musculoskeletal:  Negative for arthralgias and myalgias.  Skin: Negative.   Allergic/Immunologic: Negative.   Neurological:  Negative for dizziness, tremors, seizures, syncope, facial asymmetry, speech difficulty, weakness, light-headedness, numbness and headaches.  Hematological: Negative.   Psychiatric/Behavioral:  Negative for confusion, hallucinations, sleep disturbance and suicidal ideas.   All other systems reviewed and are negative.       Objective:  BP 138/71   Pulse 92   Temp 97.8 F (36.6 C) (Temporal)   Ht 5\' 3"  (1.6 m)   Wt 172 lb 12.8 oz (78.4 kg)   SpO2 91%   BMI 30.61 kg/m    Wt Readings from Last 3 Encounters:  01/08/23 172 lb 12.8 oz (78.4 kg)  11/05/22 174 lb (78.9 kg)  10/07/22 174 lb 9.6 oz (79.2 kg)    Physical Exam Vitals and nursing note reviewed.  Constitutional:      General: She is not in acute distress.    Appearance: Normal appearance. She is well-developed and well-groomed. She is obese. She is not ill-appearing, toxic-appearing or diaphoretic.  HENT:     Head: Normocephalic and atraumatic.     Jaw: There is normal jaw occlusion.      Right Ear: Hearing normal.     Left Ear: Hearing normal.     Nose: Nose normal.     Mouth/Throat:     Lips: Pink.     Mouth: Mucous membranes are moist.     Pharynx: Oropharynx is clear. Uvula midline.  Eyes:     General: Lids are normal.     Extraocular Movements: Extraocular movements intact.  Conjunctiva/sclera: Conjunctivae normal.     Pupils: Pupils are equal, round, and reactive to light.  Neck:     Thyroid: No thyroid mass, thyromegaly or thyroid tenderness.     Vascular: No carotid bruit or JVD.     Trachea: Trachea and phonation normal.  Cardiovascular:     Rate and Rhythm: Normal rate and regular rhythm.     Chest Wall: PMI is not displaced.     Pulses: Normal pulses.     Heart sounds: Normal heart sounds. No murmur heard.    No friction rub. No gallop.  Pulmonary:     Effort: Pulmonary effort is normal. No respiratory distress.     Breath sounds: Normal breath sounds. No wheezing.  Abdominal:     General: Bowel sounds are normal. There is no distension or abdominal bruit.     Palpations: Abdomen is soft. There is no hepatomegaly or splenomegaly.     Tenderness: There is no abdominal tenderness. There is no right CVA tenderness or left CVA tenderness.     Hernia: No hernia is present.  Musculoskeletal:        General: Normal range of motion.     Cervical back: Normal range of motion and neck supple.     Right lower leg: No edema.     Left lower leg: No edema.  Lymphadenopathy:     Cervical: No cervical adenopathy.  Skin:    General: Skin is warm and dry.     Capillary Refill: Capillary refill takes less than 2 seconds.     Coloration: Skin is not cyanotic, jaundiced or pale.     Findings: No rash.  Neurological:     General: No focal deficit present.     Mental Status: She is alert and oriented to person, place, and time.     Sensory: Sensation is intact.     Motor: Motor function is intact.     Coordination: Coordination is intact.     Gait: Gait is  intact.     Deep Tendon Reflexes: Reflexes are normal and symmetric.  Psychiatric:        Attention and Perception: Attention and perception normal.        Mood and Affect: Mood and affect normal.        Speech: Speech normal.        Behavior: Behavior normal. Behavior is cooperative.        Thought Content: Thought content normal.        Cognition and Memory: Cognition and memory normal.        Judgment: Judgment normal.     Results for orders placed or performed in visit on 11/06/22  HM DIABETES EYE EXAM  Result Value Ref Range   HM Diabetic Eye Exam No Retinopathy No Retinopathy       Pertinent labs & imaging results that were available during my care of the patient were reviewed by me and considered in my medical decision making.  Assessment & Plan:  Alayna Sveum" was seen today for diabetes.  Diagnoses and all orders for this visit:  Type 2 diabetes mellitus with hyperglycemia, without long-term current use of insulin (HCC) Diabetes treated with oral medication (HCC) A1C 6.2. Stop metformin as pt has CKD and diarrhea. Continue other medications. Repeat A1C in 3 months. Diet and exercise encouraged.  -     CMP14+EGFR -     Bayer DCA Hb A1c Waived -     Vitamin B12  Hypertension associated  with type 2 diabetes mellitus (HCC) BP well controlled. Changes were not made in regimen today. Goal BP is 130/80. Pt aware to report any persistent high or low readings. DASH diet and exercise encouraged. Exercise at least 150 minutes per week and increase as tolerated. Goal BMI > 25. Stress management encouraged. Avoid nicotine and tobacco product use. Avoid excessive alcohol and NSAID's. Avoid more than 2000 mg of sodium daily. Medications as prescribed. Follow up as scheduled.  -     CMP14+EGFR  Hyperlipidemia associated with type 2 diabetes mellitus (HCC) Diet encouraged - increase intake of fresh fruits and vegetables, increase intake of lean proteins. Bake, broil, or grill foods.  Avoid fried, greasy, and fatty foods. Avoid fast foods. Increase intake of fiber-rich whole grains. Exercise encouraged - at least 150 minutes per week and advance as tolerated.  Goal BMI < 25. Continue medications as prescribed. Follow up in 3-6 months as discussed.   CKD stage 3 due to type 2 diabetes mellitus (HCC) Followed by nephrology on a regular basis. Metformin stopped today as A1C is very controlled.  -     CMP14+EGFR  Other iron deficiency anemia Recently started on iron repletion and tolerating well. Has appointment for repeat labs scheduled.   Encounter for immunization -     Flu Vaccine Trivalent High Dose (Fluad)     Continue all other maintenance medications.  Follow up plan: Return in about 3 months (around 04/09/2023), or if symptoms worsen or fail to improve, for DM.   Continue healthy lifestyle choices, including diet (rich in fruits, vegetables, and lean proteins, and low in salt and simple carbohydrates) and exercise (at least 30 minutes of moderate physical activity daily).  Educational handout given for DM  The above assessment and management plan was discussed with the patient. The patient verbalized understanding of and has agreed to the management plan. Patient is aware to call the clinic if they develop any new symptoms or if symptoms persist or worsen. Patient is aware when to return to the clinic for a follow-up visit. Patient educated on when it is appropriate to go to the emergency department.   Kari Baars, FNP-C [facility] [phone]

## 2023-01-08 NOTE — Patient Instructions (Signed)

## 2023-01-09 LAB — CMP14+EGFR
ALT: 10 IU/L (ref 0–32)
AST: 14 IU/L (ref 0–40)
Albumin: 4.1 g/dL (ref 3.8–4.8)
Alkaline Phosphatase: 102 IU/L (ref 44–121)
BUN/Creatinine Ratio: 9 — ABNORMAL LOW (ref 12–28)
BUN: 12 mg/dL (ref 8–27)
Bilirubin Total: 0.4 mg/dL (ref 0.0–1.2)
CO2: 22 mmol/L (ref 20–29)
Calcium: 9.3 mg/dL (ref 8.7–10.3)
Chloride: 97 mmol/L (ref 96–106)
Creatinine, Ser: 1.36 mg/dL — ABNORMAL HIGH (ref 0.57–1.00)
Globulin, Total: 2.2 g/dL (ref 1.5–4.5)
Glucose: 93 mg/dL (ref 70–99)
Potassium: 4.3 mmol/L (ref 3.5–5.2)
Sodium: 136 mmol/L (ref 134–144)
Total Protein: 6.3 g/dL (ref 6.0–8.5)
eGFR: 41 mL/min/{1.73_m2} — ABNORMAL LOW (ref >59)

## 2023-01-09 LAB — VITAMIN B12: Vitamin B-12: 684 pg/mL (ref 232–1245)

## 2023-01-12 ENCOUNTER — Encounter: Payer: Self-pay | Admitting: Pharmacist

## 2023-01-12 DIAGNOSIS — Z1231 Encounter for screening mammogram for malignant neoplasm of breast: Secondary | ICD-10-CM | POA: Diagnosis not present

## 2023-01-12 LAB — HM MAMMOGRAPHY

## 2023-01-13 ENCOUNTER — Encounter: Payer: Self-pay | Admitting: Family Medicine

## 2023-02-02 DIAGNOSIS — E1122 Type 2 diabetes mellitus with diabetic chronic kidney disease: Secondary | ICD-10-CM | POA: Diagnosis not present

## 2023-02-02 DIAGNOSIS — D649 Anemia, unspecified: Secondary | ICD-10-CM | POA: Diagnosis not present

## 2023-02-02 DIAGNOSIS — I129 Hypertensive chronic kidney disease with stage 1 through stage 4 chronic kidney disease, or unspecified chronic kidney disease: Secondary | ICD-10-CM | POA: Diagnosis not present

## 2023-02-02 DIAGNOSIS — N189 Chronic kidney disease, unspecified: Secondary | ICD-10-CM | POA: Diagnosis not present

## 2023-02-02 DIAGNOSIS — R809 Proteinuria, unspecified: Secondary | ICD-10-CM | POA: Diagnosis not present

## 2023-02-02 DIAGNOSIS — E611 Iron deficiency: Secondary | ICD-10-CM | POA: Diagnosis not present

## 2023-02-03 ENCOUNTER — Encounter: Payer: Self-pay | Admitting: Family Medicine

## 2023-02-06 ENCOUNTER — Other Ambulatory Visit: Payer: Self-pay | Admitting: Family Medicine

## 2023-02-06 DIAGNOSIS — L209 Atopic dermatitis, unspecified: Secondary | ICD-10-CM

## 2023-02-08 NOTE — Telephone Encounter (Signed)
[facility]akes patient Last office visit 01/08/23 Last refill 07/07/22, 30 grams, no refills

## 2023-02-10 DIAGNOSIS — I129 Hypertensive chronic kidney disease with stage 1 through stage 4 chronic kidney disease, or unspecified chronic kidney disease: Secondary | ICD-10-CM | POA: Diagnosis not present

## 2023-02-10 DIAGNOSIS — R809 Proteinuria, unspecified: Secondary | ICD-10-CM | POA: Diagnosis not present

## 2023-02-10 DIAGNOSIS — E1122 Type 2 diabetes mellitus with diabetic chronic kidney disease: Secondary | ICD-10-CM | POA: Diagnosis not present

## 2023-02-10 DIAGNOSIS — E1129 Type 2 diabetes mellitus with other diabetic kidney complication: Secondary | ICD-10-CM | POA: Diagnosis not present

## 2023-02-24 ENCOUNTER — Telehealth: Payer: Self-pay | Admitting: Family Medicine

## 2023-02-24 NOTE — Telephone Encounter (Signed)
Pt called stating that she called [facility] to get an update on shipment for her Rybelsus Rx. Was told that they had already shipped to our office and we should have received it on 02/18/23. I do not see the Rx at the office. Pt was given tracking # [id number] [id number] but I can't find it anywhere.

## 2023-02-25 ENCOUNTER — Encounter: Payer: Self-pay | Admitting: Pharmacist

## 2023-03-07 ENCOUNTER — Other Ambulatory Visit: Payer: Self-pay | Admitting: Nurse Practitioner

## 2023-03-07 DIAGNOSIS — L209 Atopic dermatitis, unspecified: Secondary | ICD-10-CM

## 2023-03-08 NOTE — Telephone Encounter (Signed)
Last office visit 01/08/23 Last refill 02/08/23, 30 grams, no refills

## 2023-03-18 ENCOUNTER — Other Ambulatory Visit: Payer: Self-pay | Admitting: Family Medicine

## 2023-03-18 DIAGNOSIS — E1165 Type 2 diabetes mellitus with hyperglycemia: Secondary | ICD-10-CM

## 2023-03-19 ENCOUNTER — Encounter: Payer: Self-pay | Admitting: Family Medicine

## 2023-03-19 ENCOUNTER — Other Ambulatory Visit: Payer: Self-pay | Admitting: Family Medicine

## 2023-03-19 DIAGNOSIS — E1165 Type 2 diabetes mellitus with hyperglycemia: Secondary | ICD-10-CM

## 2023-03-19 MED ORDER — METFORMIN HCL 500 MG PO TABS
500.0000 mg | ORAL_TABLET | Freq: Two times a day (BID) | ORAL | 3 refills | Status: DC
Start: 2023-03-19 — End: 2023-04-13

## 2023-04-02 ENCOUNTER — Other Ambulatory Visit: Payer: Self-pay | Admitting: Family Medicine

## 2023-04-02 DIAGNOSIS — I10 Essential (primary) hypertension: Secondary | ICD-10-CM

## 2023-04-13 ENCOUNTER — Ambulatory Visit (INDEPENDENT_AMBULATORY_CARE_PROVIDER_SITE_OTHER): Payer: Medicare Other | Admitting: Family Medicine

## 2023-04-13 ENCOUNTER — Encounter: Payer: Self-pay | Admitting: Family Medicine

## 2023-04-13 VITALS — BP 142/77 | HR 78 | Temp 97.7°F | Ht 63.0 in | Wt 174.4 lb

## 2023-04-13 DIAGNOSIS — E1165 Type 2 diabetes mellitus with hyperglycemia: Secondary | ICD-10-CM | POA: Diagnosis not present

## 2023-04-13 DIAGNOSIS — E1122 Type 2 diabetes mellitus with diabetic chronic kidney disease: Secondary | ICD-10-CM | POA: Diagnosis not present

## 2023-04-13 DIAGNOSIS — N183 Chronic kidney disease, stage 3 unspecified: Secondary | ICD-10-CM

## 2023-04-13 DIAGNOSIS — I152 Hypertension secondary to endocrine disorders: Secondary | ICD-10-CM

## 2023-04-13 DIAGNOSIS — E119 Type 2 diabetes mellitus without complications: Secondary | ICD-10-CM

## 2023-04-13 DIAGNOSIS — E1169 Type 2 diabetes mellitus with other specified complication: Secondary | ICD-10-CM

## 2023-04-13 DIAGNOSIS — G479 Sleep disorder, unspecified: Secondary | ICD-10-CM

## 2023-04-13 DIAGNOSIS — Z7984 Long term (current) use of oral hypoglycemic drugs: Secondary | ICD-10-CM

## 2023-04-13 DIAGNOSIS — E1159 Type 2 diabetes mellitus with other circulatory complications: Secondary | ICD-10-CM

## 2023-04-13 DIAGNOSIS — E785 Hyperlipidemia, unspecified: Secondary | ICD-10-CM

## 2023-04-13 LAB — BAYER DCA HB A1C WAIVED: HB A1C (BAYER DCA - WAIVED): 5.4 % (ref 4.8–5.6)

## 2023-04-13 MED ORDER — LOVASTATIN 40 MG PO TABS: 40.0000 mg | ORAL_TABLET | Freq: Every day | ORAL | 3 refills | Status: DC

## 2023-04-13 MED ORDER — TRAZODONE HCL 50 MG PO TABS: 50.0000 mg | ORAL_TABLET | Freq: Every evening | ORAL | 3 refills | Status: DC | PRN

## 2023-04-13 MED ORDER — RYBELSUS 14 MG PO TABS: 14.0000 mg | ORAL_TABLET | Freq: Every day | ORAL | 3 refills | Status: DC

## 2023-04-13 MED ORDER — AMLODIPINE BESYLATE 10 MG PO TABS: 10.0000 mg | ORAL_TABLET | Freq: Every day | ORAL | 3 refills | Status: DC

## 2023-04-13 MED ORDER — LISINOPRIL-HYDROCHLOROTHIAZIDE 20-25 MG PO TABS
1.0000 | ORAL_TABLET | Freq: Every day | ORAL | 0 refills | Status: DC
Start: 2023-04-13 — End: 2023-07-12

## 2023-04-13 MED ORDER — METFORMIN HCL 500 MG PO TABS: 500.0000 mg | ORAL_TABLET | Freq: Two times a day (BID) | ORAL | 3 refills | Status: DC

## 2023-04-13 MED ORDER — GLIPIZIDE 10 MG PO TABS: 10.0000 mg | ORAL_TABLET | Freq: Every day | ORAL | 0 refills | Status: DC

## 2023-04-13 NOTE — Patient Instructions (Addendum)

## 2023-04-13 NOTE — Progress Notes (Signed)
Subjective:  Patient ID: Jeanne Medina, female    DOB: 01-14-1951, 72 y.o.   MRN: [id number]  Patient Care Team: Sonny Masters, FNP as PCP - General (Family Medicine) Danella Maiers, [facility] (Pharmacist) Michaelle Copas, MD as Referring Physician (Optometry)   Chief Complaint:  Diabetes (3 month followup )   HPI: Jeanne Medina is a 72 y.o. female presenting on 04/13/2023 for Diabetes (3 month followup )   Discussed the use of AI scribe software for clinical note transcription with the patient, who gave verbal consent to proceed.  History of Present Illness   The patient, known to have diabetes, hypertension, and hyperlipidemia, presents for a routine follow-up. She reports a significant improvement in bowel regularity since starting on a fiber supplement, as recommended during a previous visit. She had been experiencing loose stools, which have now normalized.  Her blood sugars have been well-controlled, with a morning reading of 113. She denies any symptoms of hyperglycemia such as increased hunger, thirst, or urination. She also denies any neuropathic symptoms or visual changes.  The patient is currently on a regimen of Rybelsus, metformin, and glipizide for diabetes management. A previous attempt to reduce the medication load resulted in an increase in blood sugar levels.  Her blood pressure is well-controlled on zestoretic and amlodipine, which she takes in the morning. She denies any significant leg swelling. She also takes lovastatin at night for hyperlipidemia and reports no muscle aches or pains.  The patient's diet has improved, with smaller portion sizes. She denies any significant heartburn. Her mental health is stable on Celexa, with well-controlled depression and anxiety.  She has been adhering to regular eye and dental check-ups. She denies any palpitations or shortness of breath. She reports no sores or significant dryness on her feet.  The patient's most recent  HbA1c was 5.4, the lowest it has been. A previous attempt to reduce metformin dosage resulted in an increase in HbA1c from 5.8 to 7.2.          Relevant past medical, surgical, family, and social history reviewed and updated as indicated.  Allergies and medications reviewed and updated. Data reviewed: Chart in Epic.   Past Medical History:  Diagnosis Date   Anxiety    Arthritis    osteoarthritis   Asthma    CKD (chronic kidney disease) stage 3, GFR 30-59 ml/min (HCC)    Diabetes mellitus without complication (HCC)    Hypercholesteremia    Hypertension     Past Surgical History:  Procedure Laterality Date   CATARACT EXTRACTION W/PHACO Left 08/18/2016   Procedure: CATARACT EXTRACTION PHACO AND INTRAOCULAR LENS PLACEMENT (IOC);  Surgeon: Jethro Bolus, MD;  Location: [facility];  Service: Ophthalmology;  Laterality: Left;  CDE: 6.05   CATARACT EXTRACTION W/PHACO Left 09/01/2016   Procedure: REPOSITIONING OF LEFT IOL LENS;  Surgeon: Jethro Bolus, MD;  Location: [facility];  Service: Ophthalmology;  Laterality: Left;   CATARACT EXTRACTION W/PHACO Right 10/06/2016   Procedure: CATARACT EXTRACTION PHACO AND INTRAOCULAR LENS PLACEMENT (IOC);  Surgeon: Jethro Bolus, MD;  Location: [facility];  Service: Ophthalmology;  Laterality: Right;  CDE: 6.21    Social History   Socioeconomic History   Marital status: Married    Spouse name: Dorene Sorrow   Number of children: 0   Years of education: Not on file   Highest education level: 12th grade  Occupational History   Occupation: retired  Tobacco Use   Smoking status: Former  Current packs/day: 0.00    Average packs/day: 0.3 packs/day for 20.0 years (5.0 ttl pk-yrs)    Types: Cigarettes    Start date: 08/13/1960    Quit date: 08/13/1980    Years since quitting: 42.6   Smokeless tobacco: Never  Vaping Use   Vaping status: Never Used  Substance and Sexual Activity   Alcohol use: No   Drug use: No   Sexual activity: Yes    Birth control/protection:  Post-menopausal  Other Topics Concern   Not on file  Social History Narrative   Lives home with her husband. Works out at [employer] 60 min 3x per week and walks 30 minutes other days.   Sister lives in [place]   Social Drivers of Health   Financial Resource Strain: Low Risk  (04/06/2023)   Overall Financial Resource Strain (CARDIA)    Difficulty of Paying Living Expenses: Not very hard  Food Insecurity: No Food Insecurity (04/06/2023)   Hunger Vital Sign    Worried About Running Out of Food in the Last Year: Never true    Ran Out of Food in the Last Year: Never true  Transportation Needs: No Transportation Needs (04/06/2023)   PRAPARE - Administrator, Civil Service (Medical): No    Lack of Transportation (Non-Medical): No  Physical Activity: Sufficiently Active (04/06/2023)   Exercise Vital Sign    Days of Exercise per Week: 3 days    Minutes of Exercise per Session: 60 min  Stress: No Stress Concern Present (04/06/2023)   [employer] of Occupational Health - Occupational Stress Questionnaire    Feeling of Stress : Only a little  Social Connections: Moderately Integrated (04/06/2023)   Social Connection and Isolation Panel [NHANES]    Frequency of Communication with Friends and Family: Once a week    Frequency of Social Gatherings with Friends and Family: Once a week    Attends Religious Services: More than 4 times per year    Active Member of [employer] or Organizations: Yes    Attends Banker Meetings: More than 4 times per year    Marital Status: Married  Catering manager Violence: Not At Risk (11/05/2022)   Humiliation, Afraid, Rape, and Kick questionnaire    Fear of Current or Ex-Partner: No    Emotionally Abused: No    Physically Abused: No    Sexually Abused: No    Outpatient Encounter Medications as of 04/13/2023  Medication Sig   acetaminophen (TYLENOL) 325 MG tablet Take 2 tablets (650 mg total) by mouth every 6 (six) hours as needed for mild  pain (or Fever >/= 101).   albuterol (PROVENTIL) (2.5 MG/3ML) 0.083% nebulizer solution Take 3 mLs (2.5 mg total) by nebulization every 2 (two) hours as needed for wheezing or shortness of breath.   albuterol (VENTOLIN HFA) 108 (90 Base) MCG/ACT inhaler Inhale 2 puffs into the lungs every 4 (four) hours as needed for wheezing or shortness of breath.   citalopram (CELEXA) 40 MG tablet Take 1 tablet (40 mg total) by mouth daily.   diphenhydrAMINE (BENADRYL) 25 MG tablet Take 25 mg by mouth at bedtime as needed (for allergies.).   fluticasone (FLONASE) 50 MCG/ACT nasal spray Use 1 spray(s) in each nostril twice daily   L-Lysine 500 MG CAPS Take 1 capsule by mouth daily.   triamcinolone cream (KENALOG) 0.1 % APPLY TOPICALLY TWICE DAILY   [DISCONTINUED] amLODipine (NORVASC) 10 MG tablet Take 1 tablet (10 mg total) by mouth daily.   [DISCONTINUED] glipiZIDE (  GLUCOTROL) 10 MG tablet TAKE 1 TABLET BY MOUTH ONCE DAILY BEFORE BREAKFAST   [DISCONTINUED] lisinopril-hydrochlorothiazide (ZESTORETIC) 20-25 MG tablet Take 1 tablet by mouth once daily   [DISCONTINUED] lovastatin (MEVACOR) 40 MG tablet Take 1 tablet (40 mg total) by mouth at bedtime.   [DISCONTINUED] metFORMIN (GLUCOPHAGE) 500 MG tablet Take 1 tablet (500 mg total) by mouth 2 (two) times daily with a meal.   [DISCONTINUED] Semaglutide (RYBELSUS) 14 MG TABS Take 1 tablet (14 mg total) by mouth daily.   [DISCONTINUED] traZODone (DESYREL) 50 MG tablet Take 1 tablet (50 mg total) by mouth at bedtime as needed. for sleep   amLODipine (NORVASC) 10 MG tablet Take 1 tablet (10 mg total) by mouth daily.   glipiZIDE (GLUCOTROL) 10 MG tablet Take 1 tablet (10 mg total) by mouth daily before breakfast.   lisinopril-hydrochlorothiazide (ZESTORETIC) 20-25 MG tablet Take 1 tablet by mouth daily.   lovastatin (MEVACOR) 40 MG tablet Take 1 tablet (40 mg total) by mouth at bedtime.   metFORMIN (GLUCOPHAGE) 500 MG tablet Take 1 tablet (500 mg total) by mouth 2 (two)  times daily with a meal.   Semaglutide (RYBELSUS) 14 MG TABS Take 1 tablet (14 mg total) by mouth daily.   traZODone (DESYREL) 50 MG tablet Take 1 tablet (50 mg total) by mouth at bedtime as needed. for sleep   No facility-administered encounter medications on file as of 04/13/2023.    Allergies  Allergen Reactions   Farxiga [Dapagliflozin]     Yeast infections   Sulfa Antibiotics Hives and Itching    welts    Pertinent ROS per HPI, otherwise unremarkable      Objective:  BP (!) 141/73   Pulse 78   Temp 97.7 F (36.5 C)   Ht 5\' 3"  (1.6 m)   Wt 174 lb 6.4 oz (79.1 kg)   SpO2 93%   BMI 30.89 kg/m    Wt Readings from Last 3 Encounters:  04/13/23 174 lb 6.4 oz (79.1 kg)  01/08/23 172 lb 12.8 oz (78.4 kg)  11/05/22 174 lb (78.9 kg)    Physical Exam Vitals and nursing note reviewed.  Constitutional:      General: She is not in acute distress.    Appearance: Normal appearance. She is well-developed and well-groomed. She is obese. She is not ill-appearing, toxic-appearing or diaphoretic.  HENT:     Head: Normocephalic and atraumatic.     Jaw: There is normal jaw occlusion.     Right Ear: Hearing normal.     Left Ear: Hearing normal.     Nose: Nose normal.     Mouth/Throat:     Lips: Pink.     Mouth: Mucous membranes are moist.     Pharynx: Oropharynx is clear. Uvula midline.  Eyes:     General: Lids are normal.     Extraocular Movements: Extraocular movements intact.     Conjunctiva/sclera: Conjunctivae normal.     Pupils: Pupils are equal, round, and reactive to light.  Neck:     Thyroid: No thyroid mass, thyromegaly or thyroid tenderness.     Vascular: No carotid bruit or JVD.     Trachea: Trachea and phonation normal.  Cardiovascular:     Rate and Rhythm: Normal rate and regular rhythm.     Chest Wall: PMI is not displaced.     Pulses: Normal pulses.          Dorsalis pedis pulses are 2+ on the right side and 2+ on the  left side.       Posterior tibial  pulses are 2+ on the right side and 2+ on the left side.     Heart sounds: Normal heart sounds. No murmur heard.    No friction rub. No gallop.  Pulmonary:     Effort: Pulmonary effort is normal. No respiratory distress.     Breath sounds: Normal breath sounds. No wheezing.  Abdominal:     General: Bowel sounds are normal. There is no distension or abdominal bruit.     Palpations: Abdomen is soft. There is no hepatomegaly or splenomegaly.     Tenderness: There is no abdominal tenderness. There is no right CVA tenderness or left CVA tenderness.     Hernia: No hernia is present.  Musculoskeletal:        General: Normal range of motion.     Cervical back: Normal range of motion and neck supple.     Right lower leg: No edema.     Left lower leg: No edema.  Feet:     Right foot:     Protective Sensation: 10 sites tested.  10 sites sensed.     Skin integrity: Skin integrity normal.     Toenail Condition: Right toenails are normal.     Left foot:     Protective Sensation: 10 sites tested.  10 sites sensed.     Skin integrity: Skin integrity normal.     Toenail Condition: Left toenails are normal.  Lymphadenopathy:     Cervical: No cervical adenopathy.  Skin:    General: Skin is warm and dry.     Capillary Refill: Capillary refill takes less than 2 seconds.     Coloration: Skin is not cyanotic, jaundiced or pale.     Findings: No rash.  Neurological:     General: No focal deficit present.     Mental Status: She is alert and oriented to person, place, and time.     Sensory: Sensation is intact.     Motor: Motor function is intact.     Coordination: Coordination is intact.     Gait: Gait is intact.     Deep Tendon Reflexes: Reflexes are normal and symmetric.  Psychiatric:        Attention and Perception: Attention and perception normal.        Mood and Affect: Mood and affect normal.        Speech: Speech normal.        Behavior: Behavior normal. Behavior is cooperative.         Thought Content: Thought content normal.        Cognition and Memory: Cognition and memory normal.        Judgment: Judgment normal.     Results for orders placed or performed in visit on 02/03/23  [facility] MAMMOGRAPHY   Collection Time: 01/12/23 12:00 AM  Result Value Ref Range   HM Mammogram 0-4 Bi-Rad 0-4 Bi-Rad, Self Reported Normal       Pertinent labs & imaging results that were available during my care of the patient were reviewed by me and considered in my medical decision making.  Assessment & Plan:  Chemise Amaro" was seen today for diabetes.  Diagnoses and all orders for this visit:  Type 2 diabetes mellitus with hyperglycemia, without long-term current use of insulin (HCC) -     CMP14+EGFR -     Bayer DCA Hb A1c Waived -     Microalbumin / creatinine urine ratio -  glipiZIDE (GLUCOTROL) 10 MG tablet; Take 1 tablet (10 mg total) by mouth daily before breakfast. -     Semaglutide (RYBELSUS) 14 MG TABS; Take 1 tablet (14 mg total) by mouth daily. -     lovastatin (MEVACOR) 40 MG tablet; Take 1 tablet (40 mg total) by mouth at bedtime. -     metFORMIN (GLUCOPHAGE) 500 MG tablet; Take 1 tablet (500 mg total) by mouth 2 (two) times daily with a meal.  Diabetes mellitus treated with oral medication (HCC) -     CMP14+EGFR -     Bayer DCA Hb A1c Waived -     glipiZIDE (GLUCOTROL) 10 MG tablet; Take 1 tablet (10 mg total) by mouth daily before breakfast. -     Semaglutide (RYBELSUS) 14 MG TABS; Take 1 tablet (14 mg total) by mouth daily. -     metFORMIN (GLUCOPHAGE) 500 MG tablet; Take 1 tablet (500 mg total) by mouth 2 (two) times daily with a meal.  Hypertension associated with type 2 diabetes mellitus (HCC) -     CMP14+EGFR -     Bayer DCA Hb A1c Waived -     Microalbumin / creatinine urine ratio -     lisinopril-hydrochlorothiazide (ZESTORETIC) 20-25 MG tablet; Take 1 tablet by mouth daily. -     amLODipine (NORVASC) 10 MG tablet; Take 1 tablet (10 mg total) by mouth  daily.  CKD stage 3 due to type 2 diabetes mellitus (HCC) -     CMP14+EGFR -     Bayer DCA Hb A1c Waived  Hyperlipidemia associated with type 2 diabetes mellitus (HCC) -     CMP14+EGFR -     Bayer DCA Hb A1c Waived -     lovastatin (MEVACOR) 40 MG tablet; Take 1 tablet (40 mg total) by mouth at bedtime.  Difficulty sleeping -     traZODone (DESYREL) 50 MG tablet; Take 1 tablet (50 mg total) by mouth at bedtime as needed. for sleep     Assessment and Plan    Type 2 Diabetes Mellitus Well-controlled with Rybelsus, metformin, and glipizide. Recent A1c is 5.4%. No symptoms of neuropathy, increased hunger, thirst, or urination. Fiber supplementation effective for bowel movements. - Continue Rybelsus, metformin 500 mg twice a day, and glipizide - Monitor blood sugars regularly - Reassess A1c in three months - Consider reducing metformin if A1c remains stable or decreases further  Hypertension Well-controlled with amlodipine. No significant leg swelling or other symptoms. - Continue amlodipine - Monitor blood pressure regularly  Hyperlipidemia Well-controlled with lovastatin. No reported muscle aches or pains. - Continue lovastatin, take at night  Depression and Anxiety Well-controlled with Celexa. No issues reported. - Continue Celexa  General Health Maintenance Up to date. Annual eye doctor visits, pending dentist appointment for chipped tooth. Mammogram due next September. Current DEXA scan. - Obtain urine sample to check protein and albumin levels - Schedule follow-up in three months - Order mammogram in September  Follow-up - Follow-up appointment in three months.        Continue all other maintenance medications.  Follow up plan: Return in about 3 months (around 07/12/2023) for DM.   Continue healthy lifestyle choices, including diet (rich in fruits, vegetables, and lean proteins, and low in salt and simple carbohydrates) and exercise (at least 30 minutes of  moderate physical activity daily).  Educational handout given for DM  The above assessment and management plan was discussed with the patient. The patient verbalized understanding of and has agreed to  the management plan. Patient is aware to call the clinic if they develop any new symptoms or if symptoms persist or worsen. Patient is aware when to return to the clinic for a follow-up visit. Patient educated on when it is appropriate to go to the emergency department.   Kari Baars, FNP-C [facility] [phone]

## 2023-04-14 LAB — CMP14+EGFR
ALT: 9 [IU]/L (ref 0–32)
AST: 13 [IU]/L (ref 0–40)
Albumin: 4 g/dL (ref 3.8–4.8)
Alkaline Phosphatase: 102 [IU]/L (ref 44–121)
BUN/Creatinine Ratio: 9 — ABNORMAL LOW (ref 12–28)
BUN: 11 mg/dL (ref 8–27)
Bilirubin Total: 0.4 mg/dL (ref 0.0–1.2)
CO2: 24 mmol/L (ref 20–29)
Calcium: 9.2 mg/dL (ref 8.7–10.3)
Chloride: 98 mmol/L (ref 96–106)
Creatinine, Ser: 1.2 mg/dL — ABNORMAL HIGH (ref 0.57–1.00)
Globulin, Total: 2 g/dL (ref 1.5–4.5)
Glucose: 130 mg/dL — ABNORMAL HIGH (ref 70–99)
Potassium: 4 mmol/L (ref 3.5–5.2)
Sodium: 140 mmol/L (ref 134–144)
Total Protein: 6 g/dL (ref 6.0–8.5)
eGFR: 48 mL/min/{1.73_m2} — ABNORMAL LOW (ref >59)

## 2023-04-14 LAB — MICROALBUMIN / CREATININE URINE RATIO
Creatinine, Urine: 50.3 mg/dL
Microalb/Creat Ratio: 13 mg/g{creat} (ref 0–29)
Microalbumin, Urine: 6.5 ug/mL

## 2023-04-26 ENCOUNTER — Encounter: Payer: Self-pay | Admitting: Family Medicine

## 2023-05-06 ENCOUNTER — Other Ambulatory Visit (HOSPITAL_COMMUNITY): Payer: Self-pay

## 2023-05-06 ENCOUNTER — Telehealth: Payer: Self-pay

## 2023-05-06 NOTE — Progress Notes (Signed)
 Pharmacy Medication Assistance Program Note    05/21/2023  Patient ID: Jeanne Medina, female   DOB: Jan 20, 1951, 73 y.o.   MRN: [id number]     05/06/2023  Outreach Medication One  Manufacturer Medication One Novo Nordisk  Nordisk Drugs Rybelsus   Dose of Rybelsus  14MG   Type of Sport And Exercise Psychologist  Date Application Received From Provider 05/06/2023  Date Application Submitted to Manufacturer 05/06/2023  Method Application Sent to Manufacturer Online  Patient Assistance Determination Approved  Approval End Date 04/26/2024

## 2023-05-19 ENCOUNTER — Encounter: Payer: Self-pay | Admitting: Family Medicine

## 2023-05-21 ENCOUNTER — Other Ambulatory Visit: Payer: Self-pay | Admitting: Family Medicine

## 2023-05-21 DIAGNOSIS — L209 Atopic dermatitis, unspecified: Secondary | ICD-10-CM

## 2023-06-09 DIAGNOSIS — N189 Chronic kidney disease, unspecified: Secondary | ICD-10-CM | POA: Diagnosis not present

## 2023-06-09 DIAGNOSIS — R809 Proteinuria, unspecified: Secondary | ICD-10-CM | POA: Diagnosis not present

## 2023-06-09 DIAGNOSIS — D649 Anemia, unspecified: Secondary | ICD-10-CM | POA: Diagnosis not present

## 2023-06-09 DIAGNOSIS — E211 Secondary hyperparathyroidism, not elsewhere classified: Secondary | ICD-10-CM | POA: Diagnosis not present

## 2023-06-09 DIAGNOSIS — D631 Anemia in chronic kidney disease: Secondary | ICD-10-CM | POA: Diagnosis not present

## 2023-06-09 DIAGNOSIS — E611 Iron deficiency: Secondary | ICD-10-CM | POA: Diagnosis not present

## 2023-06-16 DIAGNOSIS — R809 Proteinuria, unspecified: Secondary | ICD-10-CM | POA: Diagnosis not present

## 2023-06-16 DIAGNOSIS — E1129 Type 2 diabetes mellitus with other diabetic kidney complication: Secondary | ICD-10-CM | POA: Diagnosis not present

## 2023-06-16 DIAGNOSIS — E1122 Type 2 diabetes mellitus with diabetic chronic kidney disease: Secondary | ICD-10-CM | POA: Diagnosis not present

## 2023-06-16 DIAGNOSIS — I129 Hypertensive chronic kidney disease with stage 1 through stage 4 chronic kidney disease, or unspecified chronic kidney disease: Secondary | ICD-10-CM | POA: Diagnosis not present

## 2023-06-23 ENCOUNTER — Other Ambulatory Visit: Payer: Self-pay | Admitting: Family Medicine

## 2023-06-23 DIAGNOSIS — F419 Anxiety disorder, unspecified: Secondary | ICD-10-CM

## 2023-07-13 ENCOUNTER — Ambulatory Visit (INDEPENDENT_AMBULATORY_CARE_PROVIDER_SITE_OTHER): Payer: Medicare Other | Admitting: Family Medicine

## 2023-07-13 ENCOUNTER — Encounter: Payer: Self-pay | Admitting: Family Medicine

## 2023-07-13 DIAGNOSIS — E1165 Type 2 diabetes mellitus with hyperglycemia: Secondary | ICD-10-CM | POA: Diagnosis not present

## 2023-07-13 DIAGNOSIS — E1159 Type 2 diabetes mellitus with other circulatory complications: Secondary | ICD-10-CM

## 2023-07-13 DIAGNOSIS — E119 Type 2 diabetes mellitus without complications: Secondary | ICD-10-CM

## 2023-07-13 DIAGNOSIS — Z7984 Long term (current) use of oral hypoglycemic drugs: Secondary | ICD-10-CM

## 2023-07-13 DIAGNOSIS — I152 Hypertension secondary to endocrine disorders: Secondary | ICD-10-CM | POA: Diagnosis not present

## 2023-07-13 LAB — BAYER DCA HB A1C WAIVED: HB A1C (BAYER DCA - WAIVED): 5.2 % (ref 4.8–5.6)

## 2023-07-13 MED ORDER — GLIPIZIDE 10 MG PO TABS: 10.0000 mg | ORAL_TABLET | Freq: Every day | ORAL | 1 refills | Status: DC

## 2023-07-13 MED ORDER — LISINOPRIL-HYDROCHLOROTHIAZIDE 20-25 MG PO TABS: 1.0000 | ORAL_TABLET | Freq: Every day | ORAL | 1 refills | Status: DC

## 2023-07-13 NOTE — Telephone Encounter (Signed)
 Copied from CRM [id number]. Topic: Clinical - Lab/Test Results >> Jul 13, 2023  2:34 PM Elle L wrote: Reason for CRM: The patient called back regarding her lab results. I relayed them to her verbatim and she expressed understanding. The call disconnected towards the end of the call. I called her back and left her a voicemail.

## 2023-07-13 NOTE — Addendum Note (Signed)
 Addended by: Sonny Masters on: 07/13/2023 01:18 PM   Modules accepted: Orders

## 2023-07-13 NOTE — Progress Notes (Signed)
 Subjective:  Patient ID: Jeanne Medina, female    DOB: 1950-10-21, 73 y.o.   MRN: [id number]  Patient Care Team: Sonny Masters, FNP as PCP - General (Family Medicine) Danella Maiers, [facility] (Pharmacist) Michaelle Copas, MD as Referring Physician (Optometry)   Chief Complaint:  Diabetes (3 month follow up )   HPI: Jeanne Medina is a 73 y.o. female presenting on 07/13/2023 for Diabetes (3 month follow up )   Discussed the use of AI scribe software for clinical note transcription with the patient, who gave verbal consent to proceed.  History of Present Illness   Jeanne Medina "Jeanne Medina" is a 73 year old female with diabetes who presents for routine follow-up of her blood sugar management. She is accompanied by her husband, Dorene Sorrow.  Blood sugar levels have been stable, with a reading of 109 mg/dL this morning. No increased hunger, thirst, or urination. She notes a decrease in appetite with age, stating 'as you grow older, you just don't eat as much as you used to.'  Her current medication regimen includes glipizide 10 mg every morning, metformin twice a day with meals, and Rybelsus at a dose of 14 mg. No side effects from Rybelsus, such as nausea, vomiting, or diarrhea, and no issues with glipizide.  No changes in vision, but she acknowledges that she and her husband, Dorene Sorrow, are overdue for an eye doctor appointment.          Relevant past medical, surgical, family, and social history reviewed and updated as indicated.  Allergies and medications reviewed and updated. Data reviewed: Chart in Epic.   Past Medical History:  Diagnosis Date   Anxiety    Arthritis    osteoarthritis   Asthma    CKD (chronic kidney disease) stage 3, GFR 30-59 ml/min (HCC)    Diabetes mellitus without complication (HCC)    Hypercholesteremia    Hypertension     Past Surgical History:  Procedure Laterality Date   CATARACT EXTRACTION W/PHACO Left 08/18/2016   Procedure: CATARACT EXTRACTION PHACO  AND INTRAOCULAR LENS PLACEMENT (IOC);  Surgeon: Jethro Bolus, MD;  Location: [facility];  Service: Ophthalmology;  Laterality: Left;  CDE: 6.05   CATARACT EXTRACTION W/PHACO Left 09/01/2016   Procedure: REPOSITIONING OF LEFT IOL LENS;  Surgeon: Jethro Bolus, MD;  Location: [facility];  Service: Ophthalmology;  Laterality: Left;   CATARACT EXTRACTION W/PHACO Right 10/06/2016   Procedure: CATARACT EXTRACTION PHACO AND INTRAOCULAR LENS PLACEMENT (IOC);  Surgeon: Jethro Bolus, MD;  Location: [facility];  Service: Ophthalmology;  Laterality: Right;  CDE: 6.21    Social History   Socioeconomic History   Marital status: Married    Spouse name: Dorene Sorrow   Number of children: 0   Years of education: Not on file   Highest education level: Some college, no degree  Occupational History   Occupation: retired  Tobacco Use   Smoking status: Former    Current packs/day: 0.00    Average packs/day: 0.3 packs/day for 20.0 years (5.0 ttl pk-yrs)    Types: Cigarettes    Start date: 08/13/1960    Quit date: 08/13/1980    Years since quitting: 42.9   Smokeless tobacco: Never  Vaping Use   Vaping status: Never Used  Substance and Sexual Activity   Alcohol use: No   Drug use: No   Sexual activity: Yes    Birth control/protection: Post-menopausal  Other Topics Concern   Not on file  Social History Narrative  Lives home with her husband. Works out at [employer] 60 min 3x per week and walks 30 minutes other days.   Sister lives in [place]   Social Drivers of Health   Financial Resource Strain: Medium Risk (07/12/2023)   Overall Financial Resource Strain (CARDIA)    Difficulty of Paying Living Expenses: Somewhat hard  Food Insecurity: No Food Insecurity (07/12/2023)   Hunger Vital Sign    Worried About Running Out of Food in the Last Year: Never true    Ran Out of Food in the Last Year: Never true  Transportation Needs: No Transportation Needs (07/12/2023)   PRAPARE - Administrator, Civil Service (Medical):  No    Lack of Transportation (Non-Medical): No  Physical Activity: Sufficiently Active (07/12/2023)   Exercise Vital Sign    Days of Exercise per Week: 3 days    Minutes of Exercise per Session: 60 min  Stress: No Stress Concern Present (07/12/2023)   [employer] of Occupational Health - Occupational Stress Questionnaire    Feeling of Stress : Only a little  Social Connections: Moderately Integrated (07/12/2023)   Social Connection and Isolation Panel [NHANES]    Frequency of Communication with Friends and Family: Once a week    Frequency of Social Gatherings with Friends and Family: Once a week    Attends Religious Services: More than 4 times per year    Active Member of [employer] or Organizations: No    Attends Engineer, structural: More than 4 times per year    Marital Status: Married  Catering manager Violence: Not At Risk (11/05/2022)   Humiliation, Afraid, Rape, and Kick questionnaire    Fear of Current or Ex-Partner: No    Emotionally Abused: No    Physically Abused: No    Sexually Abused: No    Outpatient Encounter Medications as of 07/13/2023  Medication Sig   acetaminophen (TYLENOL) 325 MG tablet Take 2 tablets (650 mg total) by mouth every 6 (six) hours as needed for mild pain (or Fever >/= 101).   albuterol (PROVENTIL) (2.5 MG/3ML) 0.083% nebulizer solution Take 3 mLs (2.5 mg total) by nebulization every 2 (two) hours as needed for wheezing or shortness of breath.   albuterol (VENTOLIN HFA) 108 (90 Base) MCG/ACT inhaler Inhale 2 puffs into the lungs every 4 (four) hours as needed for wheezing or shortness of breath.   amLODipine (NORVASC) 10 MG tablet Take 1 tablet (10 mg total) by mouth daily.   citalopram (CELEXA) 40 MG tablet Take 1 tablet by mouth once daily   diphenhydrAMINE (BENADRYL) 25 MG tablet Take 25 mg by mouth at bedtime as needed (for allergies.).   fluticasone (FLONASE) 50 MCG/ACT nasal spray Use 1 spray(s) in each nostril twice daily   L-Lysine 500  MG CAPS Take 1 capsule by mouth daily.   lovastatin (MEVACOR) 40 MG tablet Take 1 tablet (40 mg total) by mouth at bedtime.   metFORMIN (GLUCOPHAGE) 500 MG tablet Take 1 tablet (500 mg total) by mouth 2 (two) times daily with a meal.   Semaglutide (RYBELSUS) 14 MG TABS Take 1 tablet (14 mg total) by mouth daily.   traZODone (DESYREL) 50 MG tablet Take 1 tablet (50 mg total) by mouth at bedtime as needed. for sleep   triamcinolone cream (KENALOG) 0.1 % APPLY TOPICALLY TWICE DAILY   glipiZIDE (GLUCOTROL) 10 MG tablet Take 1 tablet (10 mg total) by mouth daily before breakfast.   lisinopril-hydrochlorothiazide (ZESTORETIC) 20-25 MG tablet Take 1 tablet  by mouth daily.   [DISCONTINUED] glipiZIDE (GLUCOTROL) 10 MG tablet Take 1 tablet (10 mg total) by mouth daily before breakfast.   [DISCONTINUED] lisinopril-hydrochlorothiazide (ZESTORETIC) 20-25 MG tablet Take 1 tablet by mouth daily.   No facility-administered encounter medications on file as of 07/13/2023.    Allergies  Allergen Reactions   Farxiga [Dapagliflozin]     Yeast infections   Sulfa Antibiotics Hives and Itching    welts    Pertinent ROS per HPI, otherwise unremarkable      Objective:  BP (!) 151/72   Pulse 84   Temp 97.7 F (36.5 C)   Ht 5\' 3"  (1.6 m)   Wt 170 lb (77.1 kg)   SpO2 94%   BMI 30.11 kg/m    Wt Readings from Last 3 Encounters:  07/13/23 170 lb (77.1 kg)  04/13/23 174 lb 6.4 oz (79.1 kg)  01/08/23 172 lb 12.8 oz (78.4 kg)    Physical Exam Vitals and nursing note reviewed.  Constitutional:      General: She is not in acute distress.    Appearance: Normal appearance. She is obese. She is not ill-appearing, toxic-appearing or diaphoretic.  HENT:     Head: Normocephalic and atraumatic.     Nose: Nose normal.     Mouth/Throat:     Mouth: Mucous membranes are moist.  Eyes:     Conjunctiva/sclera: Conjunctivae normal.     Pupils: Pupils are equal, round, and reactive to light.  Cardiovascular:      Rate and Rhythm: Normal rate and regular rhythm.     Heart sounds: Normal heart sounds.  Pulmonary:     Effort: Pulmonary effort is normal.     Breath sounds: Normal breath sounds.  Musculoskeletal:     Cervical back: Neck supple.     Right lower leg: No edema.     Left lower leg: No edema.  Skin:    General: Skin is warm and dry.     Capillary Refill: Capillary refill takes less than 2 seconds.  Neurological:     General: No focal deficit present.     Mental Status: She is alert and oriented to person, place, and time.  Psychiatric:        Mood and Affect: Mood normal.        Behavior: Behavior normal.        Thought Content: Thought content normal.        Judgment: Judgment normal.      Results for orders placed or performed in visit on 04/13/23  Bayer DCA Hb A1c Waived   Collection Time: 04/13/23  8:23 AM  Result Value Ref Range   HB A1C (BAYER DCA - WAIVED) 5.4 4.8 - 5.6 %  CMP14+EGFR   Collection Time: 04/13/23  8:24 AM  Result Value Ref Range   Glucose 130 (H) 70 - 99 mg/dL   BUN 11 8 - 27 mg/dL   Creatinine, Ser 1.61 (H) 0.57 - 1.00 mg/dL   eGFR 48 (L) >09 UE/AVW/0.98   BUN/Creatinine Ratio 9 (L) 12 - 28   Sodium 140 134 - 144 mmol/L   Potassium 4.0 3.5 - 5.2 mmol/L   Chloride 98 96 - 106 mmol/L   CO2 24 20 - 29 mmol/L   Calcium 9.2 8.7 - 10.3 mg/dL   Total Protein 6.0 6.0 - 8.5 g/dL   Albumin 4.0 3.8 - 4.8 g/dL   Globulin, Total 2.0 1.5 - 4.5 g/dL   Bilirubin Total 0.4 0.0 - 1.2  mg/dL   Alkaline Phosphatase 102 44 - 121 IU/L   AST 13 0 - 40 IU/L   ALT 9 0 - 32 IU/L  Microalbumin / creatinine urine ratio   Collection Time: 04/13/23  8:47 AM  Result Value Ref Range   Creatinine, Urine 50.3 Not Estab. mg/dL   Microalbumin, Urine 6.5 Not Estab. ug/mL   Microalb/Creat Ratio 13 0 - 29 mg/g creat       Pertinent labs & imaging results that were available during my care of the patient were reviewed by me and considered in my medical decision  making.  Assessment & Plan:  Haislee Corso" was seen today for diabetes.  Diagnoses and all orders for this visit:  Hypertension associated with type 2 diabetes mellitus (HCC) -     CMP14+EGFR -     Bayer DCA Hb A1c Waived -     lisinopril-hydrochlorothiazide (ZESTORETIC) 20-25 MG tablet; Take 1 tablet by mouth daily.  Type 2 diabetes mellitus with hyperglycemia, without long-term current use of insulin (HCC) -     CMP14+EGFR -     Bayer DCA Hb A1c Waived -     glipiZIDE (GLUCOTROL) 10 MG tablet; Take 1 tablet (10 mg total) by mouth daily before breakfast.  Diabetes mellitus treated with oral medication (HCC) -     CMP14+EGFR -     Bayer DCA Hb A1c Waived -     glipiZIDE (GLUCOTROL) 10 MG tablet; Take 1 tablet (10 mg total) by mouth daily before breakfast.     Assessment and Plan    Type 2 Diabetes Mellitus Type 2 Diabetes Mellitus with good glycemic control. Blood sugar this morning was 109 mg/dL. No polyphagia, polydipsia, or polyuria reported. Currently on glipizide 10 mg daily, metformin twice daily, and Rybelsus 14 mg. Previous A1c was 5.4%, with prior values of 6.2% and 7.7%. Discontinuing metformin may benefit renal function as her creatinine level is approximately 1.2 mg/dL. If A1c remains controlled, glipizide may also be discontinued in the future. - Order A1c test - Discontinue metformin if A1c is below 7% - Continue glipizide 10 mg daily - Continue Rybelsus 14 mg daily - Follow up in 3 months to evaluate the possibility of discontinuing glipizide  General Health Maintenance Has not had a recent ophthalmologic examination. Importance of regular eye exams discussed, especially in the context of diabetes management. - Encourage scheduling an ophthalmology appointment          Continue all other maintenance medications.  Follow up plan: Return in about 3 months (around 10/13/2023) for DM.   Continue healthy lifestyle choices, including diet (rich in fruits,  vegetables, and lean proteins, and low in salt and simple carbohydrates) and exercise (at least 30 minutes of moderate physical activity daily).  Educational handout given for DM  The above assessment and management plan was discussed with the patient. The patient verbalized understanding of and has agreed to the management plan. Patient is aware to call the clinic if they develop any new symptoms or if symptoms persist or worsen. Patient is aware when to return to the clinic for a follow-up visit. Patient educated on when it is appropriate to go to the emergency department.   Kari Baars, FNP-C [facility] [phone]

## 2023-07-13 NOTE — Patient Instructions (Signed)

## 2023-07-14 LAB — CMP14+EGFR
ALT: 6 IU/L (ref 0–32)
AST: 8 IU/L (ref 0–40)
Albumin: 4.3 g/dL (ref 3.8–4.8)
Alkaline Phosphatase: 97 IU/L (ref 44–121)
BUN/Creatinine Ratio: 11 — ABNORMAL LOW (ref 12–28)
BUN: 12 mg/dL (ref 8–27)
Bilirubin Total: 0.4 mg/dL (ref 0.0–1.2)
CO2: 23 mmol/L (ref 20–29)
Calcium: 9.4 mg/dL (ref 8.7–10.3)
Chloride: 96 mmol/L (ref 96–106)
Creatinine, Ser: 1.08 mg/dL — ABNORMAL HIGH (ref 0.57–1.00)
Globulin, Total: 2.1 g/dL (ref 1.5–4.5)
Glucose: 112 mg/dL — ABNORMAL HIGH (ref 70–99)
Potassium: 3.5 mmol/L (ref 3.5–5.2)
Sodium: 137 mmol/L (ref 134–144)
Total Protein: 6.4 g/dL (ref 6.0–8.5)
eGFR: 54 mL/min/{1.73_m2} — ABNORMAL LOW (ref >59)

## 2023-08-04 ENCOUNTER — Other Ambulatory Visit: Payer: Self-pay | Admitting: Family Medicine

## 2023-08-04 DIAGNOSIS — J0110 Acute frontal sinusitis, unspecified: Secondary | ICD-10-CM

## 2023-09-17 ENCOUNTER — Other Ambulatory Visit: Payer: Self-pay | Admitting: Family Medicine

## 2023-09-17 DIAGNOSIS — F419 Anxiety disorder, unspecified: Secondary | ICD-10-CM

## 2023-10-14 ENCOUNTER — Ambulatory Visit: Admitting: Family Medicine

## 2023-10-20 ENCOUNTER — Telehealth: Payer: Self-pay

## 2023-11-04 ENCOUNTER — Ambulatory Visit: Admitting: Family Medicine

## 2023-11-04 ENCOUNTER — Ambulatory Visit: Payer: Self-pay | Admitting: Family Medicine

## 2023-11-04 ENCOUNTER — Ambulatory Visit

## 2023-11-04 ENCOUNTER — Encounter: Payer: Self-pay | Admitting: Family Medicine

## 2023-11-04 VITALS — BP 134/73 | HR 78 | Temp 98.2°F | Ht 63.0 in | Wt 167.0 lb

## 2023-11-04 DIAGNOSIS — E1159 Type 2 diabetes mellitus with other circulatory complications: Secondary | ICD-10-CM

## 2023-11-04 DIAGNOSIS — E785 Hyperlipidemia, unspecified: Secondary | ICD-10-CM

## 2023-11-04 DIAGNOSIS — I152 Hypertension secondary to endocrine disorders: Secondary | ICD-10-CM | POA: Diagnosis not present

## 2023-11-04 DIAGNOSIS — F419 Anxiety disorder, unspecified: Secondary | ICD-10-CM

## 2023-11-04 DIAGNOSIS — E1165 Type 2 diabetes mellitus with hyperglycemia: Secondary | ICD-10-CM | POA: Diagnosis not present

## 2023-11-04 DIAGNOSIS — E1169 Type 2 diabetes mellitus with other specified complication: Secondary | ICD-10-CM

## 2023-11-04 DIAGNOSIS — Z7984 Long term (current) use of oral hypoglycemic drugs: Secondary | ICD-10-CM

## 2023-11-04 DIAGNOSIS — N183 Chronic kidney disease, stage 3 unspecified: Secondary | ICD-10-CM

## 2023-11-04 DIAGNOSIS — G479 Sleep disorder, unspecified: Secondary | ICD-10-CM

## 2023-11-04 DIAGNOSIS — J4520 Mild intermittent asthma, uncomplicated: Secondary | ICD-10-CM

## 2023-11-04 DIAGNOSIS — E119 Type 2 diabetes mellitus without complications: Secondary | ICD-10-CM | POA: Diagnosis not present

## 2023-11-04 DIAGNOSIS — E1122 Type 2 diabetes mellitus with diabetic chronic kidney disease: Secondary | ICD-10-CM

## 2023-11-04 LAB — BAYER DCA HB A1C WAIVED: HB A1C (BAYER DCA - WAIVED): 6.6 % — ABNORMAL HIGH (ref 4.8–5.6)

## 2023-11-04 LAB — CBC WITH DIFFERENTIAL/PLATELET
Basophils Absolute: 0.1 x10E3/uL (ref 0.0–0.2)
Basos: 1 %
EOS (ABSOLUTE): 0.3 x10E3/uL (ref 0.0–0.4)
Eos: 4 %
Hematocrit: 40.2 % (ref 34.0–46.6)
Hemoglobin: 13 g/dL (ref 11.1–15.9)
Immature Grans (Abs): 0 x10E3/uL (ref 0.0–0.1)
Immature Granulocytes: 0 %
Lymphocytes Absolute: 3 x10E3/uL (ref 0.7–3.1)
Lymphs: 35 %
MCH: 29 pg (ref 26.6–33.0)
MCHC: 32.3 g/dL (ref 31.5–35.7)
MCV: 90 fL (ref 79–97)
Monocytes Absolute: 0.6 x10E3/uL (ref 0.1–0.9)
Monocytes: 7 %
Neutrophils Absolute: 4.6 x10E3/uL (ref 1.4–7.0)
Neutrophils: 53 %
Platelets: 383 x10E3/uL (ref 150–450)
RBC: 4.49 x10E6/uL (ref 3.77–5.28)
RDW: 13.1 % (ref 11.7–15.4)
WBC: 8.5 x10E3/uL (ref 3.4–10.8)

## 2023-11-04 MED ORDER — CITALOPRAM HYDROBROMIDE 40 MG PO TABS: 40.0000 mg | ORAL_TABLET | Freq: Every day | ORAL | 3 refills | Status: AC

## 2023-11-04 NOTE — Patient Instructions (Addendum)

## 2023-11-04 NOTE — Progress Notes (Signed)
 Unable to complete diabetic eye exam due to pt eye not dilating

## 2023-11-04 NOTE — Progress Notes (Signed)
 Subjective:  Patient ID: Jeanne Medina, female    DOB: 15-Dec-1950, 73 y.o.   MRN: [id number]  Patient Care Team: Severa Rock HERO, FNP as PCP - General (Family Medicine) Billee Mliss BIRCH, [facility] (Pharmacist) Ladora Ross Lacy Phebe, MD as Referring Physician (Optometry)   Chief Complaint:  Diabetes (3 month follow up )   HPI: Jeanne Medina is a 73 y.o. female presenting on 11/04/2023 for Diabetes (3 month follow up )   The patient presents for a follow-up visit to manage their diabetes, hypertension, and other chronic conditions.  They are currently taking 14 mg of Rybelsus  and 10 mg of glipizide  for diabetes management. Blood sugar readings have been stable, with a recent reading of 113 mg/dL and a highest reading of 129 mg/dL since the last visit. Their A1c is 6.6%. They have recently started eating a peanut butter sandwich with non-grain bread for breakfast instead of just a ginger snap.  For hypertension, they are on amlodipine  and Zestoretic  (lisinopril  HCTZ) with no recent changes in these medications.  They are taking iron supplements and report no constipation, a common side effect.  They are on lovastatin  for cholesterol management and report no issues.  For sleep, they take trazodone , and for anxiety and depression, they are on Celexa . They report no side effects such as headaches, nausea, or vomiting, and no thoughts of self-harm or harm to others.  They have a history of asthma but report no issues this summer and have not needed to use their albuterol  inhaler.  No significant changes in weight, though they may have gained a pound or two from recent dietary changes. No chest pain, leg swelling, or shortness of breath. Bowel habits are more loose than usual, but they are not experiencing constipation.         11/04/2023    9:02 AM 07/13/2023    8:49 AM 04/13/2023    8:34 AM 01/08/2023    9:02 AM  GAD 7 : Generalized Anxiety Score  Nervous, Anxious, on Edge 0 0 0 0   Control/stop worrying 0 0 0 0  Worry too much - different things 0 0 0 0  Trouble relaxing 0 0 0 0  Restless 0 0 0 0  Easily annoyed or irritable 0 0 0 0  Afraid - awful might happen 0 0 0 0  Total GAD 7 Score 0 0 0 0  Anxiety Difficulty Not difficult at all Not difficult at all Not difficult at all Not difficult at all       11/04/2023    9:02 AM 07/13/2023    8:49 AM 04/13/2023    8:34 AM 01/08/2023    9:02 AM 11/05/2022    9:15 AM  Depression screen PHQ 2/9  Decreased Interest 0 0 0 0 0  Down, Depressed, Hopeless 0 0 0 0 0  PHQ - 2 Score 0 0 0 0 0  Altered sleeping 1 0 1 0 0  Tired, decreased energy 0 0 0 0 0  Change in appetite 0 0 0 0 0  Feeling bad or failure about yourself  0 0 0 0 0  Trouble concentrating 0 0 0 0 0  Moving slowly or fidgety/restless 0 0 0 0 0  Suicidal thoughts 0 0 0 0 0  PHQ-9 Score 1 0 1 0 0  Difficult doing work/chores Not difficult at all Not difficult at all Not difficult at all Not difficult at all Not difficult at all  Relevant past medical, surgical, family, and social history reviewed and updated as indicated.  Allergies and medications reviewed and updated. Data reviewed: Chart in Epic.   Past Medical History:  Diagnosis Date   Anxiety    Arthritis    osteoarthritis   Asthma    CKD (chronic kidney disease) stage 3, GFR 30-59 ml/min (HCC)    Diabetes mellitus without complication (HCC)    Hypercholesteremia    Hypertension     Past Surgical History:  Procedure Laterality Date   CATARACT EXTRACTION W/PHACO Left 08/18/2016   Procedure: CATARACT EXTRACTION PHACO AND INTRAOCULAR LENS PLACEMENT (IOC);  Surgeon: Oneil Platts, MD;  Location: [facility];  Service: Ophthalmology;  Laterality: Left;  CDE: 6.05   CATARACT EXTRACTION W/PHACO Left 09/01/2016   Procedure: REPOSITIONING OF LEFT IOL LENS;  Surgeon: Platts Oneil, MD;  Location: [facility];  Service: Ophthalmology;  Laterality: Left;   CATARACT EXTRACTION W/PHACO Right 10/06/2016    Procedure: CATARACT EXTRACTION PHACO AND INTRAOCULAR LENS PLACEMENT (IOC);  Surgeon: Platts Oneil, MD;  Location: [facility];  Service: Ophthalmology;  Laterality: Right;  CDE: 6.21    Social History   Socioeconomic History   Marital status: Married    Spouse name: Christopher   Number of children: 0   Years of education: Not on file   Highest education level: Associate degree: occupational, Scientist, product/process development, or vocational program  Occupational History   Occupation: retired  Tobacco Use   Smoking status: Former    Current packs/day: 0.00    Average packs/day: 0.3 packs/day for 20.0 years (5.0 ttl pk-yrs)    Types: Cigarettes    Start date: 08/13/1960    Quit date: 08/13/1980    Years since quitting: 43.2   Smokeless tobacco: Never  Vaping Use   Vaping status: Never Used  Substance and Sexual Activity   Alcohol use: No   Drug use: No   Sexual activity: Yes    Birth control/protection: Post-menopausal  Other Topics Concern   Not on file  Social History Narrative   Lives home with her husband. Works out at [employer] 60 min 3x per week and walks 30 minutes other days.   Sister lives in [place]   Social Drivers of Health   Financial Resource Strain: Medium Risk (11/03/2023)   Overall Financial Resource Strain (CARDIA)    Difficulty of Paying Living Expenses: Somewhat hard  Food Insecurity: No Food Insecurity (11/03/2023)   Hunger Vital Sign    Worried About Running Out of Food in the Last Year: Never true    Ran Out of Food in the Last Year: Never true  Transportation Needs: No Transportation Needs (11/03/2023)   PRAPARE - Administrator, Civil Service (Medical): No    Lack of Transportation (Non-Medical): No  Physical Activity: Sufficiently Active (11/03/2023)   Exercise Vital Sign    Days of Exercise per Week: 3 days    Minutes of Exercise per Session: 60 min  Stress: No Stress Concern Present (07/12/2023)   [employer] of Occupational Health - Occupational Stress Questionnaire     Feeling of Stress : Only a little  Social Connections: Moderately Integrated (11/03/2023)   Social Connection and Isolation Panel    Frequency of Communication with Friends and Family: Once a week    Frequency of Social Gatherings with Friends and Family: Three times a week    Attends Religious Services: More than 4 times per year    Active Member of Clubs or Organizations: No  Attends Banker Meetings: Not on file    Marital Status: Married  Intimate Partner Violence: Not At Risk (11/05/2022)   Humiliation, Afraid, Rape, and Kick questionnaire    Fear of Current or Ex-Partner: No    Emotionally Abused: No    Physically Abused: No    Sexually Abused: No    Outpatient Encounter Medications as of 11/04/2023  Medication Sig   acetaminophen  (TYLENOL ) 325 MG tablet Take 2 tablets (650 mg total) by mouth every 6 (six) hours as needed for mild pain (or Fever >/= 101).   albuterol  (PROVENTIL ) (2.5 MG/3ML) 0.083% nebulizer solution Take 3 mLs (2.5 mg total) by nebulization every 2 (two) hours as needed for wheezing or shortness of breath.   albuterol  (VENTOLIN  HFA) 108 (90 Base) MCG/ACT inhaler Inhale 2 puffs into the lungs every 4 (four) hours as needed for wheezing or shortness of breath.   amLODipine  (NORVASC ) 10 MG tablet Take 1 tablet (10 mg total) by mouth daily.   diphenhydrAMINE  (BENADRYL ) 25 MG tablet Take 25 mg by mouth at bedtime as needed (for allergies.).   FEROSUL 325 (65 Fe) MG tablet Take 325 mg by mouth daily.   fluticasone  (FLONASE ) 50 MCG/ACT nasal spray Use 1 spray(s) in each nostril twice daily   glipiZIDE  (GLUCOTROL ) 10 MG tablet Take 1 tablet (10 mg total) by mouth daily before breakfast.   L-Lysine 500 MG CAPS Take 1 capsule by mouth daily.   lisinopril -hydrochlorothiazide  (ZESTORETIC ) 20-25 MG tablet Take 1 tablet by mouth daily.   lovastatin  (MEVACOR ) 40 MG tablet Take 1 tablet (40 mg total) by mouth at bedtime.   Semaglutide  (RYBELSUS ) 14 MG TABS Take 1  tablet (14 mg total) by mouth daily.   traZODone  (DESYREL ) 50 MG tablet Take 1 tablet (50 mg total) by mouth at bedtime as needed. for sleep   triamcinolone  cream (KENALOG ) 0.1 % APPLY TOPICALLY TWICE DAILY   [DISCONTINUED] citalopram  (CELEXA ) 40 MG tablet Take 1 tablet by mouth once daily   citalopram  (CELEXA ) 40 MG tablet Take 1 tablet (40 mg total) by mouth daily.   No facility-administered encounter medications on file as of 11/04/2023.    Allergies  Allergen Reactions   Farxiga  [Dapagliflozin ]     Yeast infections   Sulfa Antibiotics Hives and Itching    welts    Pertinent ROS per HPI, otherwise unremarkable      Objective:  BP 134/73   Pulse 78   Temp 98.2 F (36.8 C)   Ht 5' 3 (1.6 m)   Wt 167 lb (75.8 kg)   SpO2 96%   BMI 29.58 kg/m    Wt Readings from Last 3 Encounters:  11/04/23 167 lb (75.8 kg)  07/13/23 170 lb (77.1 kg)  04/13/23 174 lb 6.4 oz (79.1 kg)    Physical Exam Vitals and nursing note reviewed.  Constitutional:      General: She is not in acute distress.    Appearance: Normal appearance. She is well-developed and well-groomed. She is obese. She is not ill-appearing, toxic-appearing or diaphoretic.  HENT:     Head: Normocephalic and atraumatic.     Jaw: There is normal jaw occlusion.     Right Ear: Hearing normal.     Left Ear: Hearing normal.     Nose: Nose normal.     Mouth/Throat:     Lips: Pink.     Mouth: Mucous membranes are moist.     Pharynx: Oropharynx is clear. Uvula midline.  Eyes:  General: Lids are normal.     Extraocular Movements: Extraocular movements intact.     Conjunctiva/sclera: Conjunctivae normal.     Pupils: Pupils are equal, round, and reactive to light.  Neck:     Trachea: Trachea and phonation normal.  Cardiovascular:     Rate and Rhythm: Normal rate and regular rhythm.     Chest Wall: PMI is not displaced.     Pulses: Normal pulses.     Heart sounds: Normal heart sounds. No murmur heard.    No friction  rub. No gallop.  Pulmonary:     Effort: Pulmonary effort is normal. No respiratory distress.     Breath sounds: Normal breath sounds. No wheezing.  Abdominal:     General: Bowel sounds are normal.     Palpations: Abdomen is soft.  Musculoskeletal:        General: Normal range of motion.     Cervical back: Normal range of motion and neck supple.     Right lower leg: No edema.     Left lower leg: No edema.  Skin:    General: Skin is warm and dry.     Capillary Refill: Capillary refill takes less than 2 seconds.     Coloration: Skin is not cyanotic, jaundiced or pale.     Findings: No rash.  Neurological:     General: No focal deficit present.     Mental Status: She is alert and oriented to person, place, and time.     Sensory: Sensation is intact.     Motor: Motor function is intact.     Coordination: Coordination is intact.     Gait: Gait is intact.     Deep Tendon Reflexes: Reflexes are normal and symmetric.  Psychiatric:        Attention and Perception: Attention and perception normal.        Mood and Affect: Mood and affect normal.        Speech: Speech normal.        Behavior: Behavior normal. Behavior is cooperative.        Thought Content: Thought content normal.        Cognition and Memory: Cognition and memory normal.        Judgment: Judgment normal.      Results for orders placed or performed in visit on 07/13/23  Bayer DCA Hb A1c Waived   Collection Time: 07/13/23  9:04 AM  Result Value Ref Range   HB A1C (BAYER DCA - WAIVED) 5.2 4.8 - 5.6 %  CMP14+EGFR   Collection Time: 07/13/23  9:14 AM  Result Value Ref Range   Glucose 112 (H) 70 - 99 mg/dL   BUN 12 8 - 27 mg/dL   Creatinine, Ser 8.91 (H) 0.57 - 1.00 mg/dL   eGFR 54 (L) >40 fO/fpw/8.26   BUN/Creatinine Ratio 11 (L) 12 - 28   Sodium 137 134 - 144 mmol/L   Potassium 3.5 3.5 - 5.2 mmol/L   Chloride 96 96 - 106 mmol/L   CO2 23 20 - 29 mmol/L   Calcium 9.4 8.7 - 10.3 mg/dL   Total Protein 6.4 6.0 - 8.5  g/dL   Albumin 4.3 3.8 - 4.8 g/dL   Globulin, Total 2.1 1.5 - 4.5 g/dL   Bilirubin Total 0.4 0.0 - 1.2 mg/dL   Alkaline Phosphatase 97 44 - 121 IU/L   AST 8 0 - 40 IU/L   ALT 6 0 - 32 IU/L  Pertinent labs & imaging results that were available during my care of the patient were reviewed by me and considered in my medical decision making.  Assessment & Plan:  Meilani Edmundson was seen today for diabetes.  Diagnoses and all orders for this visit:  Type 2 diabetes mellitus with hyperglycemia, without long-term current use of insulin  (HCC) -     CBC with Differential/Platelet -     Bayer DCA Hb A1c Waived  Diabetes mellitus treated with oral medication (HCC) -     CBC with Differential/Platelet -     Bayer DCA Hb A1c Waived  Hypertension associated with type 2 diabetes mellitus (HCC) -     CBC with Differential/Platelet -     Bayer DCA Hb A1c Waived  Hyperlipidemia associated with type 2 diabetes mellitus (HCC) -     CBC with Differential/Platelet -     Bayer DCA Hb A1c Waived  CKD stage 3 due to type 2 diabetes mellitus (HCC) -     CBC with Differential/Platelet  Anxiety -     CBC with Differential/Platelet -     citalopram  (CELEXA ) 40 MG tablet; Take 1 tablet (40 mg total) by mouth daily.  Mild intermittent asthma without complication -     CBC with Differential/Platelet  Difficulty sleeping -     CBC with Differential/Platelet -     citalopram  (CELEXA ) 40 MG tablet; Take 1 tablet (40 mg total) by mouth daily.       Type 2 Diabetes Mellitus Diabetes is well-controlled on Rybelsus  14 mg and glipizide  10 mg, with a recent A1c of 6.6%. Previous attempts to discontinue medication when A1c fell below 6.5% resulted in increased levels. - Continue Rybelsus  and glipizide  - Order A1c test  Hypertension On amlodipine  and lisinopril  HCTZ (Zestoretic ).  Iron Deficiency Anemia Currently on iron supplementation with no constipation reported. Advised to take Metamucil fiber  capsules daily to prevent constipation. - Advise daily intake of Metamucil fiber capsules  Hyperlipidemia Hyperlipidemia is managed with lovastatin , with no reported issues.  Anxiety and Depression Anxiety and depression are managed with Celexa , with no side effects such as headaches, nausea, or vomiting, and no thoughts of self-harm or harm to others. - Refill Celexa  prescription and send to [facility]  Insomnia Insomnia is managed with trazodone , with no reported issues.  Asthma Asthma is well-controlled with no need for albuterol  inhaler this summer.  General Health Maintenance Advised to maintain a balanced diet and practice moderation in food intake to support overall health.  Follow-up Scheduled for follow-up with Dr. Archie on August 4th, eye exam with Miss Chiquita, and another follow-up in three months. - Schedule follow-up appointment with Dr. Archie on August 4th - Schedule eye exam with Miss Chiquita - Schedule follow-up appointment in three months          Continue all other maintenance medications.  Follow up plan: Return in about 3 months (around 02/04/2024), or if symptoms worsen or fail to improve, for chronic follow up .   Continue healthy lifestyle choices, including diet (rich in fruits, vegetables, and lean proteins, and low in salt and simple carbohydrates) and exercise (at least 30 minutes of moderate physical activity daily).  Educational handout given for DM  The above assessment and management plan was discussed with the patient. The patient verbalized understanding of and has agreed to the management plan. Patient is aware to call the clinic if they develop any new symptoms or if symptoms persist or worsen. Patient is aware  when to return to the clinic for a follow-up visit. Patient educated on when it is appropriate to go to the emergency department.   Rosaline Bruns, FNP-C [facility] [phone]

## 2023-11-08 ENCOUNTER — Ambulatory Visit: Payer: Medicare Other

## 2023-11-08 VITALS — BP 134/73 | HR 78 | Ht 63.0 in | Wt 167.0 lb

## 2023-11-08 DIAGNOSIS — Z0000 Encounter for general adult medical examination without abnormal findings: Secondary | ICD-10-CM | POA: Diagnosis not present

## 2023-11-08 NOTE — Progress Notes (Signed)
 Subjective:   Jeanne Medina is a 73 y.o. who presents for a Medicare Wellness preventive visit.  As a reminder, Annual Wellness Visits don't include a physical exam, and some assessments may be limited, especially if this visit is performed virtually. We may recommend an in-person follow-up visit with your provider if needed.  Visit Complete: Virtual I connected with  Jeanne Medina on 11/08/23 by a audio enabled telemedicine application and verified that I am speaking with the correct person using two identifiers.  Patient Location: Home  Provider Location: Home Office  I discussed the limitations of evaluation and management by telemedicine. The patient expressed understanding and agreed to proceed.  Vital Signs: Because this visit was a virtual/telehealth visit, some criteria may be missing or patient reported. Any vitals not documented were not able to be obtained and vitals that have been documented are patient reported.  VideoDeclined- This patient declined Librarian, academic. Therefore the visit was completed with audio only.  Persons Participating in Visit: Patient.  AWV Questionnaire: Yes: Patient Medicare AWV questionnaire was completed by the patient on 11/05/23; I have confirmed that all information answered by patient is correct and no changes since this date.  Cardiac Risk Factors include: advanced age (>43men, >70 women);diabetes mellitus;dyslipidemia;hypertension;obesity (BMI >30kg/m2)     Objective:    Today's Vitals   11/08/23 0921  BP: 134/73  Pulse: 78  Weight: 167 lb (75.8 kg)  Height: 5' 3 (1.6 m)   Body mass index is 29.58 kg/m.     11/08/2023    9:33 AM 11/05/2022    9:16 AM 10/06/2021   11:17 AM 03/18/2021    4:00 PM 03/17/2021    7:46 AM 10/03/2020   12:50 PM 10/03/2019    2:21 PM  Advanced Directives  Does Patient Have a Medical Advance Directive? Yes Yes Yes No Yes Yes Yes  Type of Estate agent  of [employer] Power of [place];Living will Healthcare Power of [place];Living will Healthcare Power of [employer] Power of [employer] Power of [place];Living will Healthcare Power of Attorney  Does patient want to make changes to medical advance directive?  No - Patient declined  No - Patient declined No - Patient declined  No - Patient declined  Copy of Healthcare Power of Attorney in Chart? Yes - validated most recent copy scanned in chart (See row information) Yes - validated most recent copy scanned in chart (See row information) Yes - validated most recent copy scanned in chart (See row information) Yes - validated most recent copy scanned in chart (See row information) Yes - validated most recent copy scanned in chart (See row information) No - copy requested No - copy requested  Would patient like information on creating a medical advance directive?    No - Patient declined       Current Medications (verified) Outpatient Encounter Medications as of 11/08/2023  Medication Sig   acetaminophen  (TYLENOL ) 325 MG tablet Take 2 tablets (650 mg total) by mouth every 6 (six) hours as needed for mild pain (or Fever >/= 101).   albuterol  (PROVENTIL ) (2.5 MG/3ML) 0.083% nebulizer solution Take 3 mLs (2.5 mg total) by nebulization every 2 (two) hours as needed for wheezing or shortness of breath.   albuterol  (VENTOLIN  HFA) 108 (90 Base) MCG/ACT inhaler Inhale 2 puffs into the lungs every 4 (four) hours as needed for wheezing or shortness of breath.   amLODipine  (NORVASC ) 10 MG tablet Take 1 tablet (10  mg total) by mouth daily.   citalopram  (CELEXA ) 40 MG tablet Take 1 tablet (40 mg total) by mouth daily.   diphenhydrAMINE  (BENADRYL ) 25 MG tablet Take 25 mg by mouth at bedtime as needed (for allergies.).   FEROSUL 325 (65 Fe) MG tablet Take 325 mg by mouth daily.   fluticasone  (FLONASE ) 50 MCG/ACT nasal spray Use 1 spray(s) in each nostril twice daily   glipiZIDE  (GLUCOTROL ) 10  MG tablet Take 1 tablet (10 mg total) by mouth daily before breakfast.   L-Lysine 500 MG CAPS Take 1 capsule by mouth daily.   lisinopril -hydrochlorothiazide  (ZESTORETIC ) 20-25 MG tablet Take 1 tablet by mouth daily.   lovastatin  (MEVACOR ) 40 MG tablet Take 1 tablet (40 mg total) by mouth at bedtime.   Semaglutide  (RYBELSUS ) 14 MG TABS Take 1 tablet (14 mg total) by mouth daily.   traZODone  (DESYREL ) 50 MG tablet Take 1 tablet (50 mg total) by mouth at bedtime as needed. for sleep   triamcinolone  cream (KENALOG ) 0.1 % APPLY TOPICALLY TWICE DAILY   No facility-administered encounter medications on file as of 11/08/2023.    Allergies (verified) Farxiga  [dapagliflozin ] and Sulfa antibiotics   History: Past Medical History:  Diagnosis Date   Anxiety    Arthritis    osteoarthritis   Asthma    CKD (chronic kidney disease) stage 3, GFR 30-59 ml/min (HCC)    Diabetes mellitus without complication (HCC)    Hypercholesteremia    Hypertension    Past Surgical History:  Procedure Laterality Date   CATARACT EXTRACTION W/PHACO Left 08/18/2016   Procedure: CATARACT EXTRACTION PHACO AND INTRAOCULAR LENS PLACEMENT (IOC);  Surgeon: Oneil Platts, MD;  Location: [facility];  Service: Ophthalmology;  Laterality: Left;  CDE: 6.05   CATARACT EXTRACTION W/PHACO Left 09/01/2016   Procedure: REPOSITIONING OF LEFT IOL LENS;  Surgeon: Platts Oneil, MD;  Location: [facility];  Service: Ophthalmology;  Laterality: Left;   CATARACT EXTRACTION W/PHACO Right 10/06/2016   Procedure: CATARACT EXTRACTION PHACO AND INTRAOCULAR LENS PLACEMENT (IOC);  Surgeon: Platts Oneil, MD;  Location: [facility];  Service: Ophthalmology;  Laterality: Right;  CDE: 6.21   Family History  Problem Relation Age of Onset   Heart failure Mother    Hypertension Mother    Heart disease Mother    Arthritis Mother    Stroke Father    Heart disease Father    Hypertension Father    Dementia Sister    Gallbladder disease Brother    Pancreatic cancer  Brother    Heart attack Brother    Rheum arthritis Sister    Heart disease Sister    Cancer Sister        tonsils   Diabetes Sister    Diabetes Sister    Heart disease Sister    Rheum arthritis Sister    Social History   Socioeconomic History   Marital status: Married    Spouse name: Christopher   Number of children: 0   Years of education: Not on file   Highest education level: Associate degree: occupational, Scientist, product/process development, or vocational program  Occupational History   Occupation: retired  Tobacco Use   Smoking status: Former    Current packs/day: 0.00    Average packs/day: 0.3 packs/day for 30.0 years (10.0 ttl pk-yrs)    Types: Cigarettes    Start date: 08/13/1960    Quit date: 08/13/1980    Years since quitting: 43.2   Smokeless tobacco: Never  Vaping Use   Vaping status: Never Used  Substance  and Sexual Activity   Alcohol use: No   Drug use: No   Sexual activity: Yes    Birth control/protection: Post-menopausal  Other Topics Concern   Not on file  Social History Narrative   Lives home with her husband. Works out at [employer] 60 min 3x per week and walks 30 minutes other days.   Sister lives in [place]   Social Drivers of Health   Financial Resource Strain: Low Risk  (11/08/2023)   Overall Financial Resource Strain (CARDIA)    Difficulty of Paying Living Expenses: Not hard at all  Recent Concern: Financial Resource Strain - Medium Risk (11/03/2023)   Overall Financial Resource Strain (CARDIA)    Difficulty of Paying Living Expenses: Somewhat hard  Food Insecurity: No Food Insecurity (11/08/2023)   Hunger Vital Sign    Worried About Running Out of Food in the Last Year: Never true    Ran Out of Food in the Last Year: Never true  Transportation Needs: No Transportation Needs (11/08/2023)   PRAPARE - Administrator, Civil Service (Medical): No    Lack of Transportation (Non-Medical): No  Physical Activity: Sufficiently Active (11/08/2023)   Exercise Vital Sign     Days of Exercise per Week: 3 days    Minutes of Exercise per Session: 60 min  Stress: No Stress Concern Present (11/08/2023)   [employer] of Occupational Health - Occupational Stress Questionnaire    Feeling of Stress: Not at all  Social Connections: Socially Integrated (11/08/2023)   Social Connection and Isolation Panel    Frequency of Communication with Friends and Family: Three times a week    Frequency of Social Gatherings with Friends and Family: Three times a week    Attends Religious Services: More than 4 times per year    Active Member of Clubs or Organizations: Yes    Attends Engineer, structural: More than 4 times per year    Marital Status: Married    Tobacco Counseling Counseling given: Yes    Clinical Intake:  Pre-visit preparation completed: Yes  Pain : No/denies pain     BMI - recorded: 29.58 Nutritional Status: BMI 25 -29 Overweight Nutritional Risks: None Diabetes: Yes CBG done?: No  Lab Results  Component Value Date   HGBA1C 6.6 (H) 11/04/2023   HGBA1C 5.2 07/13/2023   HGBA1C 5.4 04/13/2023     How often do you need to have someone help you when you read instructions, pamphlets, or other written materials from your doctor or pharmacy?: 1 - Never  Interpreter Needed?: No  Information entered by :: alia t/cma   Activities of Daily Living     11/05/2023    4:20 PM  In your present state of health, do you have any difficulty performing the following activities:  Hearing? 0  Vision? 0  Difficulty concentrating or making decisions? 0  Walking or climbing stairs? 0  Dressing or bathing? 0  Doing errands, shopping? 0  Preparing Food and eating ? N  Using the Toilet? N  In the past six months, have you accidently leaked urine? N  Do you have problems with loss of bowel control? N  Managing your Medications? N  Managing your Finances? N  Housekeeping or managing your Housekeeping? N    Patient Care Team: Severa Rock HERO, FNP as  PCP - General (Family Medicine) Billee Mliss BIRCH, [facility] (Pharmacist) Ladora Ross Lacy Phebe, MD as Referring Physician (Optometry)  I have updated your Care Teams any recent  Medical Services you may have received from other providers in the past year.     Assessment:   This is a routine wellness examination for Anija.  Hearing/Vision screen Hearing Screening - Comments:: Pt denies hearing dif Vision Screening - Comments:: Pt denies vision dif/pt goes Dr. Jama at [facility] in [place]/last ov 1/24   Goals Addressed             This Visit's Progress    Patient Stated   On track    10/06/2021 - continue going to [employer] - increase strength - maintain independence       Depression Screen     11/08/2023    9:28 AM 11/04/2023    9:02 AM 07/13/2023    8:49 AM 04/13/2023    8:34 AM 01/08/2023    9:02 AM 11/05/2022    9:15 AM 10/07/2022    8:02 AM  PHQ 2/9 Scores  PHQ - 2 Score 0 0 0 0 0 0 0  PHQ- 9 Score 0 1 0 1 0 0 0    Fall Risk     11/08/2023    9:24 AM 11/05/2023    4:20 PM 11/04/2023    9:02 AM 07/13/2023    8:49 AM 04/13/2023    8:34 AM  Fall Risk   Falls in the past year? 0 0 0 0 0  Number falls in past yr: 0      Injury with Fall? 0      Risk for fall due to : No Fall Risks  No Fall Risks No Fall Risks   Follow up Falls evaluation completed  Falls evaluation completed Falls evaluation completed     MEDICARE RISK AT HOME:  Medicare Risk at Home Any stairs in or around the home?: (Patient-Rptd) Yes If so, are there any without handrails?: (Patient-Rptd) No Home free of loose throw rugs in walkways, pet beds, electrical cords, etc?: (Patient-Rptd) Yes Adequate lighting in your home to reduce risk of falls?: (Patient-Rptd) Yes Life alert?: (Patient-Rptd) No Use of a cane, walker or w/c?: (Patient-Rptd) No Grab bars in the bathroom?: (Patient-Rptd) No Shower chair or bench in shower?: (Patient-Rptd) No Elevated toilet seat or a handicapped toilet?: (Patient-Rptd) No  TIMED UP  AND GO:  Was the test performed?  no  Cognitive Function: 6CIT completed        11/08/2023    9:29 AM 11/05/2022    9:16 AM 10/06/2021   11:18 AM 10/03/2019    2:18 PM  6CIT Screen  What Year? 0 points 0 points 0 points 0 points  What month? 0 points 0 points 0 points 0 points  What time? 0 points 0 points 0 points 0 points  Count back from 20 0 points 0 points 0 points 0 points  Months in reverse 0 points 0 points 2 points 0 points  Repeat phrase 0 points 0 points 2 points 0 points  Total Score 0 points 0 points 4 points 0 points    Immunizations Immunization History  Administered Date(s) Administered   Fluad Trivalent(High Dose 65+) 01/08/2023   Influenza Split 02/10/2017   Influenza,inj,Quad PF,6+ Mos 01/26/2019   Influenza,inj,quad, With Preservative 01/28/2018   Influenza-Unspecified 03/14/2007, 02/09/2014, 02/08/2015, 01/28/2018, 02/02/2020, 02/13/2021   Pneumococcal Conjugate-13 02/09/2014   Pneumococcal Polysaccharide-23 05/14/2015   Td 05/09/1998   Tdap 06/11/2017   Zoster Recombinant(Shingrix) 02/02/2020, 07/11/2020   Zoster, Live 12/02/2012    Screening Tests Health Maintenance  Topic Date Due   OPHTHALMOLOGY EXAM  06/22/2023  COVID-19 Vaccine (1 - 2024-25 season) 11/20/2023 (Originally 12/27/2022)   INFLUENZA VACCINE  11/26/2023   MAMMOGRAM  01/13/2024   Fecal DNA (Cologuard)  04/01/2024   Diabetic kidney evaluation - Urine ACR  04/12/2024   FOOT EXAM  04/12/2024   HEMOGLOBIN A1C  05/06/2024   Diabetic kidney evaluation - eGFR measurement  07/12/2024   Medicare Annual Wellness (AWV)  11/07/2024   DTaP/Tdap/Td (3 - Td or Tdap) 06/12/2027   Pneumococcal Vaccine: 50+ Years  Completed   DEXA SCAN  Completed   Hepatitis C Screening  Completed   Zoster Vaccines- Shingrix  Completed   Hepatitis B Vaccines  Aged Out   HPV VACCINES  Aged Out   Meningococcal B Vaccine  Aged Out    Health Maintenance  Health Maintenance Due  Topic Date Due   OPHTHALMOLOGY  EXAM  06/22/2023   Health Maintenance Items Addressed: See Nurse Notes at the end of this note  Additional Screening:  Vision Screening: Recommended annual ophthalmology exams for early detection of glaucoma and other disorders of the eye. Would you like a referral to an eye doctor? No    Dental Screening: Recommended annual dental exams for proper oral hygiene  Community Resource Referral / Chronic Care Management: CRR required this visit?  No   CCM required this visit?  No   Plan:    I have personally reviewed and noted the following in the patient's chart:   Medical and social history Use of alcohol, tobacco or illicit drugs  Current medications and supplements including opioid prescriptions. Patient is not currently taking opioid prescriptions. Functional ability and status Nutritional status Physical activity Advanced directives List of other physicians Hospitalizations, surgeries, and ER visits in previous 12 months Vitals Screenings to include cognitive, depression, and falls Referrals and appointments  In addition, I have reviewed and discussed with patient certain preventive protocols, quality metrics, and best practice recommendations. A written personalized care plan for preventive services as well as general preventive health recommendations were provided to patient.   Ozie Ned, CMA   11/08/2023   After Visit Summary: (MyChart) Due to this being a telephonic visit, the after visit summary with patients personalized plan was offered to patient via MyChart   Notes: Patient is aware and due for Diabetic eye exam, will make an appt soon per pt

## 2023-11-08 NOTE — Patient Instructions (Signed)
 [name] , Thank you for taking time out of your busy schedule to complete your Annual Wellness Visit with me. I enjoyed our conversation and look forward to speaking with you again next year. I, as well as your care team,  appreciate your ongoing commitment to your health goals. Please review the following plan we discussed and let me know if I can assist you in the future. Your Game plan/ To Do List     Follow up Visits: Next Medicare AWV with our clinical staff: 11/08/24 at 9:20a.m.   Have you seen your provider in the last 6 months (3 months if uncontrolled diabetes)? Yes Next Office Visit with your provider: 02/08/24 at 8:05a.m.  Clinician Recommendations:  Aim for 30 minutes of exercise or brisk walking, 6-8 glasses of water, and 5 servings of fruits and vegetables each day.       This is a list of the screening recommended for you and due dates:  Health Maintenance  Topic Date Due   Eye exam for diabetics  06/22/2023   Medicare Annual Wellness Visit  11/05/2023   COVID-19 Vaccine (1 - 2024-25 season) 11/20/2023*   Flu Shot  11/26/2023   Mammogram  01/13/2024   Cologuard (Stool DNA test)  04/01/2024   Yearly kidney health urinalysis for diabetes  04/12/2024   Complete foot exam   04/12/2024   Hemoglobin A1C  05/06/2024   Yearly kidney function blood test for diabetes  07/12/2024   DTaP/Tdap/Td vaccine (3 - Td or Tdap) 06/12/2027   Pneumococcal Vaccine for age over 18  Completed   DEXA scan (bone density measurement)  Completed   Hepatitis C Screening  Completed   Zoster (Shingles) Vaccine  Completed   Hepatitis B Vaccine  Aged Out   HPV Vaccine  Aged Out   Meningitis B Vaccine  Aged Out  *Topic was postponed. The date shown is not the original due date.    Advanced directives: (In Chart) A copy of your advanced directives are scanned into your chart should your provider ever need it. Advance Care Planning is important because it:  [x]  Makes sure you receive the medical  care that is consistent with your values, goals, and preferences  [x]  It provides guidance to your family and loved ones and reduces their decisional burden about whether or not they are making the right decisions based on your wishes.  Follow the link provided in your after visit summary or read over the paperwork we have mailed to you to help you started getting your Advance Directives in place. If you need assistance in completing these, please reach out to us  so that we can help you!  See attachments for Preventive Care and Fall Prevention Tips.

## 2023-11-18 DIAGNOSIS — E211 Secondary hyperparathyroidism, not elsewhere classified: Secondary | ICD-10-CM | POA: Diagnosis not present

## 2023-11-18 DIAGNOSIS — E559 Vitamin D deficiency, unspecified: Secondary | ICD-10-CM | POA: Diagnosis not present

## 2023-11-18 DIAGNOSIS — N189 Chronic kidney disease, unspecified: Secondary | ICD-10-CM | POA: Diagnosis not present

## 2023-11-18 DIAGNOSIS — D649 Anemia, unspecified: Secondary | ICD-10-CM | POA: Diagnosis not present

## 2023-11-18 DIAGNOSIS — D631 Anemia in chronic kidney disease: Secondary | ICD-10-CM | POA: Diagnosis not present

## 2023-11-18 DIAGNOSIS — R809 Proteinuria, unspecified: Secondary | ICD-10-CM | POA: Diagnosis not present

## 2023-11-26 DIAGNOSIS — E119 Type 2 diabetes mellitus without complications: Secondary | ICD-10-CM | POA: Diagnosis not present

## 2023-11-26 DIAGNOSIS — H40033 Anatomical narrow angle, bilateral: Secondary | ICD-10-CM | POA: Diagnosis not present

## 2023-11-29 DIAGNOSIS — E1122 Type 2 diabetes mellitus with diabetic chronic kidney disease: Secondary | ICD-10-CM | POA: Diagnosis not present

## 2023-11-29 DIAGNOSIS — I129 Hypertensive chronic kidney disease with stage 1 through stage 4 chronic kidney disease, or unspecified chronic kidney disease: Secondary | ICD-10-CM | POA: Diagnosis not present

## 2023-11-29 DIAGNOSIS — R809 Proteinuria, unspecified: Secondary | ICD-10-CM | POA: Diagnosis not present

## 2023-11-29 DIAGNOSIS — N1832 Chronic kidney disease, stage 3b: Secondary | ICD-10-CM | POA: Diagnosis not present

## 2023-12-03 DIAGNOSIS — K080 Exfoliation of teeth due to systemic causes: Secondary | ICD-10-CM | POA: Diagnosis not present

## 2023-12-15 DIAGNOSIS — K080 Exfoliation of teeth due to systemic causes: Secondary | ICD-10-CM | POA: Diagnosis not present

## 2023-12-17 ENCOUNTER — Telehealth: Admitting: Physician Assistant

## 2023-12-17 DIAGNOSIS — R112 Nausea with vomiting, unspecified: Secondary | ICD-10-CM

## 2023-12-17 DIAGNOSIS — R42 Dizziness and giddiness: Secondary | ICD-10-CM

## 2023-12-17 NOTE — Progress Notes (Signed)
  Because of your dizziness, nausea and vomiting, I feel your condition warrants further evaluation and I recommend that you be seen in a face-to-face visit.   NOTE: There will be NO CHARGE for this E-Visit   If you are having a true medical emergency, please call 911.     For an urgent face to face visit, [facility] has multiple urgent care centers for your convenience.  Click the link below for the full list of locations and hours, walk-in wait times, appointment scheduling options and driving directions:  Urgent Care - [place], [place], [place], [place], [place], [place]  [place]     Your MyChart E-visit questionnaire answers were reviewed by a board certified advanced clinical practitioner to complete your personal care plan based on your specific symptoms.    Thank you for using e-Visits.  Approximately 5 minutes was spent documenting and reviewing patient's chart.

## 2023-12-28 ENCOUNTER — Other Ambulatory Visit: Payer: Self-pay | Admitting: Family Medicine

## 2023-12-28 DIAGNOSIS — L209 Atopic dermatitis, unspecified: Secondary | ICD-10-CM

## 2024-01-14 DIAGNOSIS — Z1231 Encounter for screening mammogram for malignant neoplasm of breast: Secondary | ICD-10-CM | POA: Diagnosis not present

## 2024-01-14 LAB — HM MAMMOGRAPHY

## 2024-01-19 ENCOUNTER — Encounter: Payer: Self-pay | Admitting: Family Medicine

## 2024-01-24 ENCOUNTER — Encounter: Payer: Self-pay | Admitting: Family Medicine

## 2024-01-25 ENCOUNTER — Ambulatory Visit: Payer: Self-pay

## 2024-01-25 ENCOUNTER — Ambulatory Visit: Admitting: Family Medicine

## 2024-01-25 ENCOUNTER — Encounter: Payer: Self-pay | Admitting: Family Medicine

## 2024-01-25 ENCOUNTER — Ambulatory Visit (INDEPENDENT_AMBULATORY_CARE_PROVIDER_SITE_OTHER)

## 2024-01-25 VITALS — BP 127/59 | HR 66 | Temp 97.5°F | Ht 63.0 in | Wt 167.4 lb

## 2024-01-25 DIAGNOSIS — R112 Nausea with vomiting, unspecified: Secondary | ICD-10-CM | POA: Diagnosis not present

## 2024-01-25 DIAGNOSIS — M549 Dorsalgia, unspecified: Secondary | ICD-10-CM | POA: Diagnosis not present

## 2024-01-25 DIAGNOSIS — M546 Pain in thoracic spine: Secondary | ICD-10-CM | POA: Diagnosis not present

## 2024-01-25 DIAGNOSIS — M4804 Spinal stenosis, thoracic region: Secondary | ICD-10-CM | POA: Diagnosis not present

## 2024-01-25 DIAGNOSIS — R42 Dizziness and giddiness: Secondary | ICD-10-CM

## 2024-01-25 DIAGNOSIS — M47814 Spondylosis without myelopathy or radiculopathy, thoracic region: Secondary | ICD-10-CM | POA: Diagnosis not present

## 2024-01-25 DIAGNOSIS — M48061 Spinal stenosis, lumbar region without neurogenic claudication: Secondary | ICD-10-CM | POA: Diagnosis not present

## 2024-01-25 DIAGNOSIS — G8929 Other chronic pain: Secondary | ICD-10-CM

## 2024-01-25 MED ORDER — METHYLPREDNISOLONE ACETATE 40 MG/ML IJ SUSP
40.0000 mg | Freq: Once | INTRAMUSCULAR | Status: AC
  Administered 2024-01-25: 40 mg via INTRAMUSCULAR

## 2024-01-25 MED ORDER — RYBELSUS 7 MG PO TABS: 7.0000 mg | ORAL_TABLET | Freq: Every day | ORAL | 3 refills | Status: DC

## 2024-01-25 MED ORDER — MECLIZINE HCL 12.5 MG PO TABS: 12.5000 mg | ORAL_TABLET | Freq: Three times a day (TID) | ORAL | 0 refills | Status: AC | PRN

## 2024-01-25 NOTE — Telephone Encounter (Signed)
 Appt made.

## 2024-01-25 NOTE — Progress Notes (Signed)
 Subjective:  Patient ID: Jeanne Medina, female    DOB: May 27, 1950, 73 y.o.   MRN: [id number]  Patient Care Team: Severa Rock HERO, FNP as PCP - General (Family Medicine) Billee Mliss BIRCH, [facility] (Pharmacist) Ladora Ross Lacy Phebe, MD as Referring Physician (Optometry)   Chief Complaint:  Back Pain (Middle back pain x 3 weeks daily. States it goes up her back and into her neck ) and Nausea (When taking the rybelsus  )   HPI: Jeanne Medina is a 73 y.o. female presenting on 01/25/2024 for Back Pain (Middle back pain x 3 weeks daily. States it goes up her back and into her neck ) and Nausea (When taking the rybelsus  )   Jeanne Medina is a 73 year old female who presents with nausea and vomiting after taking Rybelsus .  She has been experiencing nausea and vomiting intermittently for about three weeks, which she attributes to her Rybelsus  medication. The symptoms occur after taking the medication on an empty stomach in the morning. She is currently on the highest dose of 14 mg. Dizziness is also associated with the nausea and vomiting, and bowel movements are now normal.  She experiences dizziness, particularly in the mornings or when changing positions from lying on her back to sitting up. She has a history of vertigo for which she was prescribed meclizine  in 2021, and it was effective in managing her symptoms.  She has chronic upper back pain between the shoulder blades extending to the neck and has a history of arthritis. She manages the pain with Tylenol  Arthritis, a heating pad, and rest, which provide relief. No other medications have been used for acute back pain relief.          Relevant past medical, surgical, family, and social history reviewed and updated as indicated.  Allergies and medications reviewed and updated. Data reviewed: Chart in Epic.   Past Medical History:  Diagnosis Date   Anxiety    Arthritis    osteoarthritis   Asthma    CKD (chronic kidney disease) stage  3, GFR 30-59 ml/min (HCC)    Diabetes mellitus without complication (HCC)    Hypercholesteremia    Hypertension     Past Surgical History:  Procedure Laterality Date   CATARACT EXTRACTION W/PHACO Left 08/18/2016   Procedure: CATARACT EXTRACTION PHACO AND INTRAOCULAR LENS PLACEMENT (IOC);  Surgeon: Oneil Platts, MD;  Location: [facility];  Service: Ophthalmology;  Laterality: Left;  CDE: 6.05   CATARACT EXTRACTION W/PHACO Left 09/01/2016   Procedure: REPOSITIONING OF LEFT IOL LENS;  Surgeon: Platts Oneil, MD;  Location: [facility];  Service: Ophthalmology;  Laterality: Left;   CATARACT EXTRACTION W/PHACO Right 10/06/2016   Procedure: CATARACT EXTRACTION PHACO AND INTRAOCULAR LENS PLACEMENT (IOC);  Surgeon: Platts Oneil, MD;  Location: [facility];  Service: Ophthalmology;  Laterality: Right;  CDE: 6.21    Social History   Socioeconomic History   Marital status: Married    Spouse name: Christopher   Number of children: 0   Years of education: Not on file   Highest education level: Associate degree: occupational, Scientist, product/process development, or vocational program  Occupational History   Occupation: retired  Tobacco Use   Smoking status: Former    Current packs/day: 0.00    Average packs/day: 0.3 packs/day for 30.0 years (10.0 ttl pk-yrs)    Types: Cigarettes    Start date: 08/13/1960    Quit date: 08/13/1980    Years since quitting: 43.4   Smokeless tobacco:  Never  Vaping Use   Vaping status: Never Used  Substance and Sexual Activity   Alcohol use: No   Drug use: No   Sexual activity: Yes    Birth control/protection: Post-menopausal  Other Topics Concern   Not on file  Social History Narrative   Lives home with her husband. Works out at [employer] 60 min 3x per week and walks 30 minutes other days.   Sister lives in [place]   Social Drivers of Health   Financial Resource Strain: Low Risk  (11/08/2023)   Overall Financial Resource Strain (CARDIA)    Difficulty of Paying Living Expenses: Not hard at all  Recent  Concern: Financial Resource Strain - Medium Risk (11/03/2023)   Overall Financial Resource Strain (CARDIA)    Difficulty of Paying Living Expenses: Somewhat hard  Food Insecurity: No Food Insecurity (11/08/2023)   Hunger Vital Sign    Worried About Running Out of Food in the Last Year: Never true    Ran Out of Food in the Last Year: Never true  Transportation Needs: No Transportation Needs (11/08/2023)   PRAPARE - Administrator, Civil Service (Medical): No    Lack of Transportation (Non-Medical): No  Physical Activity: Sufficiently Active (11/08/2023)   Exercise Vital Sign    Days of Exercise per Week: 3 days    Minutes of Exercise per Session: 60 min  Stress: No Stress Concern Present (11/08/2023)   [employer] of Occupational Health - Occupational Stress Questionnaire    Feeling of Stress: Not at all  Social Connections: Socially Integrated (11/08/2023)   Social Connection and Isolation Panel    Frequency of Communication with Friends and Family: Three times a week    Frequency of Social Gatherings with Friends and Family: Three times a week    Attends Religious Services: More than 4 times per year    Active Member of Clubs or Organizations: Yes    Attends Banker Meetings: More than 4 times per year    Marital Status: Married  Catering manager Violence: Not At Risk (11/08/2023)   Humiliation, Afraid, Rape, and Kick questionnaire    Fear of Current or Ex-Partner: No    Emotionally Abused: No    Physically Abused: No    Sexually Abused: No    Outpatient Encounter Medications as of 01/25/2024  Medication Sig   acetaminophen  (TYLENOL ) 325 MG tablet Take 2 tablets (650 mg total) by mouth every 6 (six) hours as needed for mild pain (or Fever >/= 101).   albuterol  (PROVENTIL ) (2.5 MG/3ML) 0.083% nebulizer solution Take 3 mLs (2.5 mg total) by nebulization every 2 (two) hours as needed for wheezing or shortness of breath.   albuterol  (VENTOLIN  HFA) 108 (90 Base)  MCG/ACT inhaler Inhale 2 puffs into the lungs every 4 (four) hours as needed for wheezing or shortness of breath.   amLODipine  (NORVASC ) 10 MG tablet Take 1 tablet (10 mg total) by mouth daily.   citalopram  (CELEXA ) 40 MG tablet Take 1 tablet (40 mg total) by mouth daily.   diphenhydrAMINE  (BENADRYL ) 25 MG tablet Take 25 mg by mouth at bedtime as needed (for allergies.).   FEROSUL 325 (65 Fe) MG tablet Take 325 mg by mouth daily.   fluticasone  (FLONASE ) 50 MCG/ACT nasal spray Use 1 spray(s) in each nostril twice daily   glipiZIDE  (GLUCOTROL ) 10 MG tablet Take 1 tablet (10 mg total) by mouth daily before breakfast.   L-Lysine 500 MG CAPS Take 1 capsule by mouth daily.  lisinopril -hydrochlorothiazide  (ZESTORETIC ) 20-25 MG tablet Take 1 tablet by mouth daily.   lovastatin  (MEVACOR ) 40 MG tablet Take 1 tablet (40 mg total) by mouth at bedtime.   meclizine  (ANTIVERT ) 12.5 MG tablet Take 1 tablet (12.5 mg total) by mouth 3 (three) times daily as needed for dizziness.   Semaglutide  (RYBELSUS ) 7 MG TABS Take 1 tablet (7 mg total) by mouth daily.   traZODone  (DESYREL ) 50 MG tablet Take 1 tablet (50 mg total) by mouth at bedtime as needed. for sleep   triamcinolone  cream (KENALOG ) 0.1 % APPLY TOPICALLY TWICE DAILY   [DISCONTINUED] Semaglutide  (RYBELSUS ) 14 MG TABS Take 1 tablet (14 mg total) by mouth daily.   [EXPIRED] methylPREDNISolone  acetate (DEPO-MEDROL ) injection 40 mg    No facility-administered encounter medications on file as of 01/25/2024.    Allergies  Allergen Reactions   Farxiga  [Dapagliflozin ]     Yeast infections   Sulfa Antibiotics Hives and Itching    welts    Pertinent ROS per HPI, otherwise unremarkable      Objective:  BP (!) 127/59   Pulse 66   Temp (!) 97.5 F (36.4 C)   Ht 5' 3 (1.6 m)   Wt 167 lb 6.4 oz (75.9 kg)   SpO2 96%   BMI 29.65 kg/m    Wt Readings from Last 3 Encounters:  01/25/24 167 lb 6.4 oz (75.9 kg)  11/08/23 167 lb (75.8 kg)  11/04/23 167 lb  (75.8 kg)    Physical Exam Vitals and nursing note reviewed.  Constitutional:      General: She is not in acute distress.    Appearance: Normal appearance. She is not ill-appearing, toxic-appearing or diaphoretic.  HENT:     Head: Normocephalic and atraumatic.     Nose: Nose normal.     Mouth/Throat:     Mouth: Mucous membranes are moist.  Eyes:     Conjunctiva/sclera: Conjunctivae normal.     Pupils: Pupils are equal, round, and reactive to light.  Cardiovascular:     Rate and Rhythm: Normal rate and regular rhythm.     Heart sounds: Normal heart sounds.  Pulmonary:     Effort: Pulmonary effort is normal.     Breath sounds: Normal breath sounds.  Abdominal:     General: Bowel sounds are normal.     Palpations: Abdomen is soft.     Tenderness: There is no abdominal tenderness.  Musculoskeletal:     Cervical back: Normal, normal range of motion and neck supple.     Thoracic back: Tenderness present. No swelling, edema, deformity, signs of trauma, lacerations, spasms or bony tenderness. Normal range of motion. No scoliosis.     Lumbar back: Normal.     Right lower leg: No edema.     Left lower leg: No edema.  Skin:    General: Skin is warm and dry.     Capillary Refill: Capillary refill takes less than 2 seconds.  Neurological:     General: No focal deficit present.     Mental Status: She is alert and oriented to person, place, and time.     Cranial Nerves: No cranial nerve deficit.     Sensory: No sensory deficit.     Motor: No weakness.     Coordination: Coordination normal.     Gait: Gait normal.     Deep Tendon Reflexes: Reflexes normal.  Psychiatric:        Mood and Affect: Mood normal.  Behavior: Behavior normal.        Thought Content: Thought content normal.        Judgment: Judgment normal.     Results for orders placed or performed in visit on 11/04/23  Bayer DCA Hb A1c Waived   Collection Time: 11/04/23  8:49 AM  Result Value Ref Range   HB A1C  (BAYER DCA - WAIVED) 6.6 (H) 4.8 - 5.6 %  CBC with Differential/Platelet   Collection Time: 11/04/23  8:50 AM  Result Value Ref Range   WBC 8.5 3.4 - 10.8 x10E3/uL   RBC 4.49 3.77 - 5.28 x10E6/uL   Hemoglobin 13.0 11.1 - 15.9 g/dL   Hematocrit 59.7 65.9 - 46.6 %   MCV 90 79 - 97 fL   MCH 29.0 26.6 - 33.0 pg   MCHC 32.3 31.5 - 35.7 g/dL   RDW 86.8 88.2 - 84.5 %   Platelets 383 150 - 450 x10E3/uL   Neutrophils 53 Not Estab. %   Lymphs 35 Not Estab. %   Monocytes 7 Not Estab. %   Eos 4 Not Estab. %   Basos 1 Not Estab. %   Neutrophils Absolute 4.6 1.4 - 7.0 x10E3/uL   Lymphocytes Absolute 3.0 0.7 - 3.1 x10E3/uL   Monocytes Absolute 0.6 0.1 - 0.9 x10E3/uL   EOS (ABSOLUTE) 0.3 0.0 - 0.4 x10E3/uL   Basophils Absolute 0.1 0.0 - 0.2 x10E3/uL   Immature Granulocytes 0 Not Estab. %   Immature Grans (Abs) 0.0 0.0 - 0.1 x10E3/uL       Pertinent labs & imaging results that were available during my care of the patient were reviewed by me and considered in my medical decision making.  Assessment & Plan:  Medea Deines was seen today for back pain and nausea.  Diagnoses and all orders for this visit:  Nausea and vomiting in adult patient -     Semaglutide  (RYBELSUS ) 7 MG TABS; Take 1 tablet (7 mg total) by mouth daily. -     Amylase -     Lipase -     CBC with Differential/Platelet -     CMP14+EGFR  Vertigo -     CBC with Differential/Platelet -     CMP14+EGFR -     meclizine  (ANTIVERT ) 12.5 MG tablet; Take 1 tablet (12.5 mg total) by mouth 3 (three) times daily as needed for dizziness.  Mid back pain, chronic -     DG Thoracic Spine 2 View; Future -     methylPREDNISolone  acetate (DEPO-MEDROL ) injection 40 mg      Nausea and vomiting due to Rybelsus  (semaglutide ) Nausea and vomiting intermittently for three weeks, likely due to Rybelsus , occurring after taking the medication on an empty stomach. Currently on 14 mg dose. A1c is well-controlled at 6.6%. - Reduce Rybelsus  dose to  7 mg. - Order labs to check amylase, lipase, and liver enzymes.  Dizziness, likely positional and post-vomiting Dizziness in the mornings or with positional changes, likely related to nausea and vomiting. Previously treated with meclizine  for vertigo. - Prescribe meclizine  as needed for dizziness, cautioning about potential drowsiness.  Type 2 diabetes mellitus Type 2 diabetes mellitus with well-controlled A1c at 6.6%.  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 Managed with Tylenol  arthritis, heating pad, and rest. Avoid oral prednisone due to diabetes, opting for a steroid injection to manage pain. - Order thoracic spine X-ray. - Administer steroid injection to manage pain.  Continue all other maintenance medications.  Follow up plan: Return if symptoms worsen or fail to improve.   Continue healthy lifestyle choices, including diet (rich in fruits, vegetables, and lean proteins, and low in salt and simple carbohydrates) and exercise (at least 30 minutes of moderate physical activity daily).  Educational handout given for vertigo, arthritis   The above assessment and management plan was discussed with the patient. The patient verbalized understanding of and has agreed to the management plan. Patient is aware to call the clinic if they develop any new symptoms or if symptoms persist or worsen. Patient is aware when to return to the clinic for a follow-up visit. Patient educated on when it is appropriate to go to the emergency department.   Rosaline Bruns, FNP-C [facility] [phone]

## 2024-01-25 NOTE — Telephone Encounter (Signed)
 FYI Only or Action Required?: FYI only for provider.  Patient was last seen in primary care on 11/04/2023 by Severa Rock HERO, FNP.  Called Nurse Triage reporting Back Pain.  Symptoms began 2 to 3 weeks.  Interventions attempted: OTC medications: tylenol .  Symptoms are: gradually worsening.  Triage Disposition: See PCP When Office is Open (Within 3 Days)  Patient/caregiver understands and will follow disposition?: Yes      Copied from CRM #[id number]. Topic: Clinical - Red Word Triage >> Jan 25, 2024  8:58 AM Mia F wrote: Red Word that prompted transfer to Nurse Triage: Pain in upper back going up into the neck. Causing dizziness. Think the dizziness could be from medication Rybulsus but not sure. Also feeling nauseous. Been going on for about 2-3 weeks. Does not seem to be getting worse but not getting better. Has been taking Rubulus for a couple of years but she notice she gets nauseous after taking the medication because sometime she throws it up. Reason for Disposition  [1] MODERATE back pain (e.g., interferes with normal activities) AND [2] present > 3 days  [1] MODERATE dizziness (e.g., interferes with normal activities) AND [2] has been evaluated by doctor (or NP/PA) for this  Answer Assessment - Initial Assessment Questions 1. ONSET: When did the pain begin? (e.g., minutes, hours, days)     2 to 3 weeks 2. LOCATION: Where does it hurt? (upper, mid or lower back)     Upper to mid back 3. SEVERITY: How bad is the pain?  (e.g., Scale 1-10; mild, moderate, or severe)     7 or 8/10 4. PATTERN: Is the pain constant? (e.g., yes, no; constant, intermittent)      Comes and goes 5. RADIATION: Does the pain shoot into your legs or somewhere else?     To neck 6. CAUSE:  What do you think is causing the back pain?      arthritis 7. BACK OVERUSE:  Any recent lifting of heavy objects, strenuous work or exercise?      no 8. MEDICINES: What have you taken so far for the  pain? (e.g., nothing, acetaminophen , NSAIDS)     Tylenol   9. NEUROLOGIC SYMPTOMS: Do you have any weakness, numbness, or problems with bowel/bladder control?     no 10. OTHER SYMPTOMS: Do you have any other symptoms? (e.g., fever, abdomen pain, burning with urination, blood in urine)       N/v - when you take Rybulsus 11. PREGNANCY: Is there any chance you are pregnant? When was your last menstrual period?       na  Pt gets dizzy when laying on back.  N/v - when you take Rybulsus  Answer Assessment - Initial Assessment Questions 1. DESCRIPTION: Describe your dizziness.     Dizziness when laying on back 2. LIGHTHEADED: Do you feel lightheaded? (e.g., somewhat faint, woozy, weak upon standing)     Dizziness and spinning 3. VERTIGO: Do you feel like either you or the room is spinning or tilting? (i.e., vertigo)     no 4. SEVERITY: How bad is it?  Do you feel like you are going to faint? Can you stand and walk?     Weak/dizzy 5. ONSET:  When did the dizziness begin?      2 to 3 weeks 6. AGGRAVATING FACTORS: Does anything make it worse? (e.g., standing, change in head position)     When laying flat 7. HEART RATE: Can you tell me your heart rate? How many beats  in 15 seconds?  (Note: Not all patients can do this.)       na 8. CAUSE: What do you think is causing the dizziness? (e.g., decreased fluids or food, diarrhea, emotional distress, heat exposure, new medicine, sudden standing, vomiting; unknown)     unknown 9. RECURRENT SYMPTOM: Have you had dizziness before? If Yes, ask: When was the last time? What happened that time?     Yes in the past 10. OTHER SYMPTOMS: Do you have any other symptoms? (e.g., fever, chest pain, vomiting, diarrhea, bleeding)       N/v 11. PREGNANCY: Is there any chance you are pregnant? When was your last menstrual period?       na  Protocols used: Back Pain-A-AH, Dizziness - Lightheadedness-A-AH

## 2024-01-26 ENCOUNTER — Ambulatory Visit: Payer: Self-pay | Admitting: Family Medicine

## 2024-01-26 DIAGNOSIS — R7989 Other specified abnormal findings of blood chemistry: Secondary | ICD-10-CM

## 2024-01-26 LAB — CMP14+EGFR
ALT: 9 IU/L (ref 0–32)
AST: 13 IU/L (ref 0–40)
Albumin: 4.4 g/dL (ref 3.8–4.8)
Alkaline Phosphatase: 80 IU/L (ref 49–135)
BUN/Creatinine Ratio: 8 — AB (ref 12–28)
BUN: 12 mg/dL (ref 8–27)
Bilirubin Total: 0.6 mg/dL (ref 0.0–1.2)
CO2: 21 mmol/L (ref 20–29)
Calcium: 10 mg/dL (ref 8.7–10.3)
Chloride: 88 mmol/L — AB (ref 96–106)
Creatinine, Ser: 1.43 mg/dL — AB (ref 0.57–1.00)
Globulin, Total: 2.3 g/dL (ref 1.5–4.5)
Glucose: 201 mg/dL — AB (ref 70–99)
Potassium: 3.4 mmol/L — AB (ref 3.5–5.2)
Sodium: 127 mmol/L — AB (ref 134–144)
Total Protein: 6.7 g/dL (ref 6.0–8.5)
eGFR: 39 mL/min/1.73 — AB (ref >59)

## 2024-01-26 LAB — CBC WITH DIFFERENTIAL/PLATELET
Basophils Absolute: 0 x10E3/uL (ref 0.0–0.2)
Basos: 0 %
EOS (ABSOLUTE): 0 x10E3/uL (ref 0.0–0.4)
Eos: 0 %
Hematocrit: 39.7 % (ref 34.0–46.6)
Hemoglobin: 13.3 g/dL (ref 11.1–15.9)
Immature Grans (Abs): 0.1 x10E3/uL (ref 0.0–0.1)
Immature Granulocytes: 1 %
Lymphocytes Absolute: 1.3 x10E3/uL (ref 0.7–3.1)
Lymphs: 13 %
MCH: 29.5 pg (ref 26.6–33.0)
MCHC: 33.5 g/dL (ref 31.5–35.7)
MCV: 88 fL (ref 79–97)
Monocytes Absolute: 0.5 x10E3/uL (ref 0.1–0.9)
Monocytes: 5 %
Neutrophils Absolute: 8.3 x10E3/uL — ABNORMAL HIGH (ref 1.4–7.0)
Neutrophils: 81 %
Platelets: 474 x10E3/uL — ABNORMAL HIGH (ref 150–450)
RBC: 4.51 x10E6/uL (ref 3.77–5.28)
RDW: 12.5 % (ref 11.7–15.4)
WBC: 10.2 x10E3/uL (ref 3.4–10.8)

## 2024-01-26 LAB — AMYLASE: Amylase: 61 U/L (ref 31–110)

## 2024-01-26 LAB — LIPASE: Lipase: 22 U/L (ref 14–85)

## 2024-01-26 NOTE — Telephone Encounter (Signed)
 Patient aware and verbalizes understanding.  Appointment scheduled and future lab placed

## 2024-01-27 ENCOUNTER — Telehealth: Payer: Self-pay | Admitting: Pharmacist

## 2024-01-27 NOTE — Telephone Encounter (Incomplete Revision)
 Farxiga  caused yeast infections

## 2024-01-27 NOTE — Telephone Encounter (Addendum)
 Rybelsus  PAP will be ending at the end of 03/2024 Patient will not be able to afford Discussed options for PAP including: Januvia , Farxiga  (but caused yeast infections) and potentially insulin  only if needed Encouraged patient to reach out to insurance to see in options are affordable in 2026 Encourage diet & lifestyle changes Will follow up with patient in January 2026  Mliss Tarry Griffin, PharmD, BCACP, CPP Clinical Pharmacist, [facility]

## 2024-02-02 ENCOUNTER — Other Ambulatory Visit

## 2024-02-02 DIAGNOSIS — R7989 Other specified abnormal findings of blood chemistry: Secondary | ICD-10-CM

## 2024-02-03 ENCOUNTER — Other Ambulatory Visit: Payer: Self-pay

## 2024-02-03 ENCOUNTER — Encounter (HOSPITAL_COMMUNITY): Payer: Self-pay | Admitting: *Deleted

## 2024-02-03 ENCOUNTER — Ambulatory Visit: Payer: Self-pay | Admitting: Family Medicine

## 2024-02-03 ENCOUNTER — Emergency Department (HOSPITAL_COMMUNITY)

## 2024-02-03 ENCOUNTER — Emergency Department (HOSPITAL_COMMUNITY)
Admission: EM | Admit: 2024-02-03 | Discharge: 2024-02-03 | Disposition: A | Discharge status: Home / Self Care | Source: Ambulatory Visit | Attending: Emergency Medicine | Admitting: Emergency Medicine

## 2024-02-03 DIAGNOSIS — E876 Hypokalemia: Secondary | ICD-10-CM | POA: Diagnosis not present

## 2024-02-03 DIAGNOSIS — R9389 Abnormal findings on diagnostic imaging of other specified body structures: Secondary | ICD-10-CM | POA: Diagnosis not present

## 2024-02-03 DIAGNOSIS — I700 Atherosclerosis of aorta: Secondary | ICD-10-CM | POA: Diagnosis not present

## 2024-02-03 DIAGNOSIS — E871 Hypo-osmolality and hyponatremia: Secondary | ICD-10-CM | POA: Diagnosis not present

## 2024-02-03 DIAGNOSIS — D72829 Elevated white blood cell count, unspecified: Secondary | ICD-10-CM | POA: Insufficient documentation

## 2024-02-03 DIAGNOSIS — S2232XA Fracture of one rib, left side, initial encounter for closed fracture: Secondary | ICD-10-CM | POA: Diagnosis not present

## 2024-02-03 DIAGNOSIS — R42 Dizziness and giddiness: Secondary | ICD-10-CM | POA: Diagnosis not present

## 2024-02-03 LAB — URINALYSIS, ROUTINE W REFLEX MICROSCOPIC
Bilirubin Urine: NEGATIVE
Glucose, UA: NEGATIVE mg/dL
Hgb urine dipstick: NEGATIVE
Ketones, ur: NEGATIVE mg/dL
Leukocytes,Ua: NEGATIVE
Nitrite: NEGATIVE
Protein, ur: NEGATIVE mg/dL
Specific Gravity, Urine: 1.002 — ABNORMAL LOW (ref 1.005–1.030)
pH: 7 (ref 5.0–8.0)

## 2024-02-03 LAB — CBC WITH DIFFERENTIAL/PLATELET
Abs Immature Granulocytes: 0.06 K/uL (ref 0.00–0.07)
Basophils Absolute: 0 K/uL (ref 0.0–0.1)
Basophils Relative: 0 %
Eosinophils Absolute: 0.1 K/uL (ref 0.0–0.5)
Eosinophils Relative: 1 %
HCT: 36.2 % (ref 36.0–46.0)
Hemoglobin: 12.9 g/dL (ref 12.0–15.0)
Immature Granulocytes: 0 %
Lymphocytes Relative: 13 %
Lymphs Abs: 1.7 K/uL (ref 0.7–4.0)
MCH: 30.3 pg (ref 26.0–34.0)
MCHC: 35.6 g/dL (ref 30.0–36.0)
MCV: 85 fL (ref 80.0–100.0)
Monocytes Absolute: 1 K/uL (ref 0.1–1.0)
Monocytes Relative: 7 %
Neutro Abs: 10.5 K/uL — ABNORMAL HIGH (ref 1.7–7.7)
Neutrophils Relative %: 79 %
Platelets: 455 K/uL — ABNORMAL HIGH (ref 150–400)
RBC: 4.26 MIL/uL (ref 3.87–5.11)
RDW: 12.8 % (ref 11.5–15.5)
WBC: 13.4 K/uL — ABNORMAL HIGH (ref 4.0–10.5)
nRBC: 0 % (ref 0.0–0.2)

## 2024-02-03 LAB — BASIC METABOLIC PANEL WITH GFR
Anion gap: 14 (ref 5–15)
BUN/Creatinine Ratio: 9 — AB (ref 12–28)
BUN: 11 mg/dL (ref 8–27)
BUN: 12 mg/dL (ref 8–23)
CO2: 24 mmol/L (ref 20–29)
CO2: 28 mmol/L (ref 22–32)
Calcium: 11.3 mg/dL — ABNORMAL HIGH (ref 8.7–10.3)
Calcium: 10.9 mg/dL — ABNORMAL HIGH (ref 8.9–10.3)
Chloride: 83 mmol/L — ABNORMAL LOW (ref 96–106)
Chloride: 85 mmol/L — ABNORMAL LOW (ref 98–111)
Creatinine, Ser: 1.25 mg/dL — AB (ref 0.57–1.00)
Creatinine, Ser: 1.18 mg/dL — ABNORMAL HIGH (ref 0.44–1.00)
GFR, Estimated: 49 mL/min — ABNORMAL LOW (ref >60)
Glucose, Bld: 89 mg/dL (ref 70–99)
Glucose: 150 mg/dL — ABNORMAL HIGH (ref 70–99)
Potassium: 3.5 mmol/L (ref 3.5–5.2)
Potassium: 2.9 mmol/L — ABNORMAL LOW (ref 3.5–5.1)
Sodium: 125 mmol/L — ABNORMAL LOW (ref 134–144)
Sodium: 127 mmol/L — ABNORMAL LOW (ref 135–145)
eGFR: 46 mL/min/1.73 — AB (ref >59)

## 2024-02-03 LAB — MAGNESIUM: Magnesium: 1.3 mg/dL — ABNORMAL LOW (ref 1.7–2.4)

## 2024-02-03 MED ORDER — POTASSIUM CHLORIDE CRYS ER 20 MEQ PO TBCR
40.0000 meq | EXTENDED_RELEASE_TABLET | Freq: Once | ORAL | Status: AC
  Administered 2024-02-03: 40 meq via ORAL
  Filled 2024-02-03: qty 2

## 2024-02-03 MED ORDER — POTASSIUM CHLORIDE ER 10 MEQ PO TBCR: 10.0000 meq | EXTENDED_RELEASE_TABLET | Freq: Every day | ORAL | 0 refills | Status: AC

## 2024-02-03 MED ORDER — MAGNESIUM CITRATE PO SOLN: 296.0000 mL | Freq: Once | ORAL | 0 refills | Status: DC

## 2024-02-03 MED ORDER — MAGNESIUM OXIDE 400 MG PO TABS: 400.0000 mg | ORAL_TABLET | Freq: Every day | ORAL | 0 refills | Status: AC

## 2024-02-03 MED ORDER — MAGNESIUM SULFATE 2 GM/50ML IV SOLN
2.0000 g | INTRAVENOUS | Status: AC
  Administered 2024-02-03: 2 g via INTRAVENOUS
  Filled 2024-02-03: qty 50

## 2024-02-03 MED ORDER — SODIUM CHLORIDE 0.9 % IV BOLUS
1000.0000 mL | Freq: Once | INTRAVENOUS | Status: AC
Start: 2024-02-03 — End: 2024-02-03
  Administered 2024-02-03: 1000 mL via INTRAVENOUS

## 2024-02-03 NOTE — ED Notes (Signed)
 Provider aware pt just urinated before sample was ordered. Will try again when pt has to void.

## 2024-02-03 NOTE — ED Notes (Signed)
 Requesting magnesium from pharmacy.

## 2024-02-03 NOTE — ED Triage Notes (Signed)
 Pt sent here for low sodium.  + dizziness, + nausea + HA

## 2024-02-03 NOTE — Discharge Instructions (Addendum)
 We have reviewed all of your blood work today, it does in fact confirm that your salt level was low but it also showed that your magnesium level was very low.  For this I would like you to start taking magnesium once a day, potassium once a day for 7 days and follow-up with your doctor this week.  I did send a message to let them know about your results, they will need to refer you to an endocrinologist  Your x-ray was normal  ER for any worsening symptoms.

## 2024-02-03 NOTE — ED Provider Notes (Signed)
 [place] EMERGENCY DEPARTMENT AT [facility] Provider Note   CSN: [id number] Arrival date & time: 02/03/24  1517     Patient presents with: No chief complaint on file.   Jeanne Medina is a 73 y.o. female.   HPI    This patient has had some degree of intermittent vertigo over time - she has been seen for this going back a couple of years - husband and patient present today with c/o dizziness described as the room spinning when she lays down and then sits up, which is what occurred when she was at the chiropractor last week.  She then went to her family doctor and had blood work which showed a sodium of 127, this was repeated within the last 48 hours and it did drop to 125.  Because of this feeling of dizziness and the low sodium she was recommended to come to the hospital for evaluation and to get a bag of salt.  The patient denies starting any new medications recently, no changes in her daily habits or dietary choices, no history of cancer, she has not smoked cigarettes in many years  The patient denies to me that she has any headache, changes in vision, numbness or weakness, dysuria diarrhea dysuria, hematuria, frequency or urgency, no edema of the legs, no rashes to the skin  Prior to Admission medications   Medication Sig Start Date End Date Taking? Authorizing Provider  magnesium oxide (MAG-OX) 400 MG tablet Take 1 tablet (400 mg total) by mouth daily. 02/03/24  Yes Cleotilde Rogue, MD  potassium chloride  (KLOR-CON ) 10 MEQ tablet Take 1 tablet (10 mEq total) by mouth daily. 02/03/24  Yes Cleotilde Rogue, MD  acetaminophen  (TYLENOL ) 325 MG tablet Take 2 tablets (650 mg total) by mouth every 6 (six) hours as needed for mild pain (or Fever >/= 101). 03/19/21   Pearlean Manus, MD  albuterol  (PROVENTIL ) (2.5 MG/3ML) 0.083% nebulizer solution Take 3 mLs (2.5 mg total) by nebulization every 2 (two) hours as needed for wheezing or shortness of breath. 03/19/21   Pearlean Manus, MD   albuterol  (VENTOLIN  HFA) 108 (90 Base) MCG/ACT inhaler Inhale 2 puffs into the lungs every 4 (four) hours as needed for wheezing or shortness of breath. 03/19/21   Pearlean Manus, MD  amLODipine  (NORVASC ) 10 MG tablet Take 1 tablet (10 mg total) by mouth daily. 04/13/23   Severa Rock HERO, FNP  citalopram  (CELEXA ) 40 MG tablet Take 1 tablet (40 mg total) by mouth daily. 11/04/23   Severa Rock HERO, FNP  diphenhydrAMINE  (BENADRYL ) 25 MG tablet Take 25 mg by mouth at bedtime as needed (for allergies.).    [provider]  FEROSUL 325 (65 Fe) MG tablet Take 325 mg by mouth daily. 08/12/23   [provider]  fluticasone  (FLONASE ) 50 MCG/ACT nasal spray Use 1 spray(s) in each nostril twice daily 08/04/23   Severa Rock HERO, FNP  glipiZIDE  (GLUCOTROL ) 10 MG tablet Take 1 tablet (10 mg total) by mouth daily before breakfast. 07/13/23   Rakes, Rock HERO, FNP  L-Lysine 500 MG CAPS Take 1 capsule by mouth daily.    [provider]  lisinopril -hydrochlorothiazide  (ZESTORETIC ) 20-25 MG tablet Take 1 tablet by mouth daily. 07/13/23   Severa Rock HERO, FNP  lovastatin  (MEVACOR ) 40 MG tablet Take 1 tablet (40 mg total) by mouth at bedtime. 04/13/23   Severa Rock HERO, FNP  meclizine  (ANTIVERT ) 12.5 MG tablet Take 1 tablet (12.5 mg total) by mouth 3 (three) times  daily as needed for dizziness. 01/25/24   Severa Rock HERO, FNP  Semaglutide  (RYBELSUS ) 7 MG TABS Take 1 tablet (7 mg total) by mouth daily. 01/25/24   Severa Rock HERO, FNP  traZODone  (DESYREL ) 50 MG tablet Take 1 tablet (50 mg total) by mouth at bedtime as needed. for sleep 04/13/23   Severa Rock HERO, FNP  triamcinolone  cream (KENALOG ) 0.1 % APPLY TOPICALLY TWICE DAILY 12/29/23   Joesph Annabella HERO, FNP    Allergies: Farxiga  [dapagliflozin ] and Sulfa antibiotics    Review of Systems  All other systems reviewed and are negative.   Updated Vital Signs BP 133/67   Pulse 87   Temp 97.9 F (36.6 C) (Oral)   Resp 20   Ht 1.6 m (5' 3)   Wt 75.8  kg   SpO2 94%   BMI 29.58 kg/m   Physical Exam Vitals and nursing note reviewed.  Constitutional:      General: She is not in acute distress.    Appearance: She is well-developed.  HENT:     Head: Normocephalic and atraumatic.     Mouth/Throat:     Pharynx: No oropharyngeal exudate.  Eyes:     General: No scleral icterus.       Right eye: No discharge.        Left eye: No discharge.     Conjunctiva/sclera: Conjunctivae normal.     Pupils: Pupils are equal, round, and reactive to light.  Neck:     Thyroid: No thyromegaly.     Vascular: No JVD.  Cardiovascular:     Rate and Rhythm: Normal rate and regular rhythm.     Heart sounds: Normal heart sounds. No murmur heard.    No friction rub. No gallop.  Pulmonary:     Effort: Pulmonary effort is normal. No respiratory distress.     Breath sounds: Normal breath sounds. No wheezing or rales.  Abdominal:     General: Bowel sounds are normal. There is no distension.     Palpations: Abdomen is soft. There is no mass.     Tenderness: There is no abdominal tenderness.  Musculoskeletal:        General: No tenderness. Normal range of motion.     Cervical back: Normal range of motion and neck supple.  Lymphadenopathy:     Cervical: No cervical adenopathy.  Skin:    General: Skin is warm and dry.     Findings: No erythema or rash.  Neurological:     Mental Status: She is alert.     Coordination: Coordination normal.     Comments: Normal speech coordination and strength, normal gait, normal level of alertness, answering my questions appropriately, normal coordination  Psychiatric:        Behavior: Behavior normal.     (all labs ordered are listed, but only abnormal results are displayed) Labs Reviewed  BASIC METABOLIC PANEL WITH GFR - Abnormal; Notable for the following components:      Result Value   Sodium 127 (*)    Potassium 2.9 (*)    Chloride 85 (*)    Creatinine, Ser 1.18 (*)    Calcium 10.9 (*)    GFR, Estimated 49  (*)    All other components within normal limits  CBC WITH DIFFERENTIAL/PLATELET - Abnormal; Notable for the following components:   WBC 13.4 (*)    Platelets 455 (*)    Neutro Abs 10.5 (*)    All other components within normal limits  MAGNESIUM -  Abnormal; Notable for the following components:   Magnesium 1.3 (*)    All other components within normal limits  URINALYSIS, ROUTINE W REFLEX MICROSCOPIC - Abnormal; Notable for the following components:   Color, Urine COLORLESS (*)    Specific Gravity, Urine 1.002 (*)    All other components within normal limits    EKG: None  Radiology: [facility] Chest Port 1 View Result Date: 02/03/2024 CLINICAL DATA:  Low-sodium. EXAM: PORTABLE CHEST 1 VIEW COMPARISON:  03/17/2021 FINDINGS: Stable heart size and mediastinal contours. Aortic atherosclerosis. Chronic interstitial coarsening. Chronic elevation of right hemidiaphragm. No evidence of confluent airspace disease or pulmonary mass. No pleural fluid. No pneumothorax. Remote left rib fracture. IMPRESSION: Chronic interstitial coarsening. No acute findings. No evidence of pulmonary mass on this single frontal view. Electronically Signed   By: Andrea Gasman M.D.   On: 02/03/2024 19:23     Procedures   Medications Ordered in the ED  sodium chloride  0.9 % bolus 1,000 mL (0 mLs Intravenous Stopped 02/03/24 1849)  magnesium sulfate IVPB 2 g 50 mL (0 g Intravenous Stopped 02/03/24 1907)  potassium chloride  SA (KLOR-CON  M) CR tablet 40 mEq (40 mEq Oral Given 02/03/24 1742)                                    Medical Decision Making Amount and/or Complexity of Data Reviewed Labs: ordered. Radiology: ordered.  Risk OTC drugs. Prescription drug management.    This patient presents to the ED for concern of dizziness, this seems to be a classic vertigo differential diagnosis includes vertigo, hyponatremia, hypomagnesemia, hypokalemia, dehydration, seems less likely to be related to heart or brain as  this is clearly a positional change causing vertigo    Additional history obtained   Additional history obtained from Electronic Medical Record External records from outside source obtained and reviewed including medical records including labs drawn at the doctor's office   Lab Tests:  I Ordered, and personally interpreted labs.  The pertinent results include: Hyponatremia to 127, labs reviewed going back over the last 12 months, has been hyponatremic since September 30, had an elevated creatinine at that time, today it is down to 1.18 which is the best it has been in the last year.  She is also hypokalemic a little bit today as well   Imaging Studies ordered:  I ordered imaging studies including chest x-ray I independently visualized and interpreted imaging which showed no acute findings other than some interstitial coarsening I agree with the radiologist interpretation   Medicines ordered and prescription drug management:  I ordered medication including sodium chloride  infusion as well as magnesium and potassium supplements    I have reviewed the patients home medicines and have made adjustments as needed   Problem List / ED Course:  The patient may very well have some adrenal issues or other endocrinologic issues causing her abnormal findings.  Will need to be referred to endocrinology, will allow family doctors to do that.  She appears hemodynamically stable otherwise, she does not appear dehydrated`   Social Determinants of Health:  none        Final diagnoses:  Hyponatremia  Hypomagnesemia  Hypokalemia    ED Discharge Orders          Ordered    magnesium citrate solution   Once,   Status:  Discontinued        02/03/24 1859  potassium chloride  (KLOR-CON ) 10 MEQ tablet  Daily        02/03/24 1859    magnesium oxide (MAG-OX) 400 MG tablet  Daily        02/03/24 1859               Cleotilde Rogue, MD 02/03/24 1941

## 2024-02-04 ENCOUNTER — Encounter: Payer: Self-pay | Admitting: Family Medicine

## 2024-02-08 ENCOUNTER — Ambulatory Visit (INDEPENDENT_AMBULATORY_CARE_PROVIDER_SITE_OTHER): Admitting: Family Medicine

## 2024-02-08 ENCOUNTER — Encounter: Payer: Self-pay | Admitting: Family Medicine

## 2024-02-08 VITALS — BP 128/78 | HR 77 | Temp 96.7°F | Ht 63.0 in | Wt 164.6 lb

## 2024-02-08 DIAGNOSIS — E785 Hyperlipidemia, unspecified: Secondary | ICD-10-CM

## 2024-02-08 DIAGNOSIS — E1165 Type 2 diabetes mellitus with hyperglycemia: Secondary | ICD-10-CM | POA: Diagnosis not present

## 2024-02-08 DIAGNOSIS — E1122 Type 2 diabetes mellitus with diabetic chronic kidney disease: Secondary | ICD-10-CM | POA: Diagnosis not present

## 2024-02-08 DIAGNOSIS — M5135 Other intervertebral disc degeneration, thoracolumbar region: Secondary | ICD-10-CM | POA: Insufficient documentation

## 2024-02-08 DIAGNOSIS — E871 Hypo-osmolality and hyponatremia: Secondary | ICD-10-CM

## 2024-02-08 DIAGNOSIS — N183 Chronic kidney disease, stage 3 unspecified: Secondary | ICD-10-CM

## 2024-02-08 DIAGNOSIS — E1159 Type 2 diabetes mellitus with other circulatory complications: Secondary | ICD-10-CM

## 2024-02-08 DIAGNOSIS — E119 Type 2 diabetes mellitus without complications: Secondary | ICD-10-CM

## 2024-02-08 DIAGNOSIS — Z7984 Long term (current) use of oral hypoglycemic drugs: Secondary | ICD-10-CM

## 2024-02-08 DIAGNOSIS — E1169 Type 2 diabetes mellitus with other specified complication: Secondary | ICD-10-CM | POA: Diagnosis not present

## 2024-02-08 DIAGNOSIS — I152 Hypertension secondary to endocrine disorders: Secondary | ICD-10-CM

## 2024-02-08 LAB — BAYER DCA HB A1C WAIVED: HB A1C (BAYER DCA - WAIVED): 6.1 % — ABNORMAL HIGH (ref 4.8–5.6)

## 2024-02-08 MED ORDER — GLIPIZIDE 10 MG PO TABS: 10.0000 mg | ORAL_TABLET | Freq: Every day | ORAL | 1 refills | Status: AC

## 2024-02-08 MED ORDER — LISINOPRIL-HYDROCHLOROTHIAZIDE 20-25 MG PO TABS: 1.0000 | ORAL_TABLET | Freq: Every day | ORAL | 1 refills | Status: DC

## 2024-02-08 NOTE — Progress Notes (Signed)
 Subjective:  Patient ID: Jeanne Medina, female    DOB: 1950-09-14, 73 y.o.   MRN: [id number]  Patient Care Team: Severa Rock HERO, FNP as PCP - General (Family Medicine) Billee Mliss BIRCH, [facility] (Pharmacist) Ladora Ross Lacy Phebe, MD as Referring Physician (Optometry)   Chief Complaint:  Medical Management of Chronic Issues (3 month chronic check up )   HPI: Jeanne Medina is a 73 y.o. female presenting on 02/08/2024 for Medical Management of Chronic Issues (3 month chronic check up )   [name] is a 73 year old female with diabetes and hypertension who presents for chronic follow-up and follow-up after an ED visit for low sodium levels.  She feels significantly better after receiving fluids in the ED, which resolved her dizziness. Her sodium levels were found to be low during recent office visit and she was sent to the ED due to symptomatic hyponatremia.   She has a history of diabetes and notes that her blood sugars have been running better. She is currently taking Rybelsus  14 mg and glipizide  10 mg, with a blood sugar reading of 109 this morning. No increased hunger, thirst, or urination. Previous issues with Rybelsus  have improved.  She continues to take Zestoretic  for hypertension. She is also taking lovastatin  for cholesterol management and denies any muscle aches or pains associated with it.  She experiences chronic back pain and reports that a recent x-ray showed degenerative changes in her thoracic and lumbar spine. She manages the pain with Tylenol  Arthritis and topical treatments applied which help alleviate the pain.  She takes fiber daily to manage constipation, which has been effective. She confirms that fiber helps with constipation.          Relevant past medical, surgical, family, and social history reviewed and updated as indicated.  Allergies and medications reviewed and updated. Data reviewed: Chart in Epic.   Past Medical History:  Diagnosis Date    Anxiety    Arthritis    osteoarthritis   Asthma    CKD (chronic kidney disease) stage 3, GFR 30-59 ml/min (HCC)    Diabetes mellitus without complication (HCC)    Hypercholesteremia    Hypertension     Past Surgical History:  Procedure Laterality Date   CATARACT EXTRACTION W/PHACO Left 08/18/2016   Procedure: CATARACT EXTRACTION PHACO AND INTRAOCULAR LENS PLACEMENT (IOC);  Surgeon: Oneil Platts, MD;  Location: [facility];  Service: Ophthalmology;  Laterality: Left;  CDE: 6.05   CATARACT EXTRACTION W/PHACO Left 09/01/2016   Procedure: REPOSITIONING OF LEFT IOL LENS;  Surgeon: Platts Oneil, MD;  Location: [facility];  Service: Ophthalmology;  Laterality: Left;   CATARACT EXTRACTION W/PHACO Right 10/06/2016   Procedure: CATARACT EXTRACTION PHACO AND INTRAOCULAR LENS PLACEMENT (IOC);  Surgeon: Platts Oneil, MD;  Location: [facility];  Service: Ophthalmology;  Laterality: Right;  CDE: 6.21    Social History   Socioeconomic History   Marital status: Married    Spouse name: Jeanne Medina   Number of children: 0   Years of education: Not on file   Highest education level: 12th grade  Occupational History   Occupation: retired  Tobacco Use   Smoking status: Former    Current packs/day: 0.00    Average packs/day: 0.3 packs/day for 30.0 years (10.0 ttl pk-yrs)    Types: Cigarettes    Start date: 08/13/1960    Quit date: 08/13/1980    Years since quitting: 43.5   Smokeless tobacco: Never  Vaping Use  Vaping status: Never Used  Substance and Sexual Activity   Alcohol use: No   Drug use: No   Sexual activity: Yes    Birth control/protection: Post-menopausal  Other Topics Concern   Not on file  Social History Narrative   Lives home with her husband. Works out at [employer] 60 min 3x per week and walks 30 minutes other days.   Sister lives in [place]   Social Drivers of Health   Financial Resource Strain: Medium Risk (02/06/2024)   Overall Financial Resource Strain (CARDIA)    Difficulty of Paying Living  Expenses: Somewhat hard  Food Insecurity: No Food Insecurity (02/06/2024)   Hunger Vital Sign    Worried About Running Out of Food in the Last Year: Never true    Ran Out of Food in the Last Year: Never true  Transportation Needs: No Transportation Needs (02/06/2024)   PRAPARE - Administrator, Civil Service (Medical): No    Lack of Transportation (Non-Medical): No  Physical Activity: Sufficiently Active (02/06/2024)   Exercise Vital Sign    Days of Exercise per Week: 3 days    Minutes of Exercise per Session: 60 min  Stress: No Stress Concern Present (02/06/2024)   [employer] of Occupational Health - Occupational Stress Questionnaire    Feeling of Stress: Not at all  Social Connections: Moderately Integrated (02/06/2024)   Social Connection and Isolation Panel    Frequency of Communication with Friends and Family: Once a week    Frequency of Social Gatherings with Friends and Family: Never    Attends Religious Services: More than 4 times per year    Active Member of [employer] or Organizations: Yes    Attends Engineer, structural: More than 4 times per year    Marital Status: Married  Catering manager Violence: Not At Risk (11/08/2023)   Humiliation, Afraid, Rape, and Kick questionnaire    Fear of Current or Ex-Partner: No    Emotionally Abused: No    Physically Abused: No    Sexually Abused: No    Outpatient Encounter Medications as of 02/08/2024  Medication Sig   acetaminophen  (TYLENOL ) 325 MG tablet Take 2 tablets (650 mg total) by mouth every 6 (six) hours as needed for mild pain (or Fever >/= 101).   amLODipine  (NORVASC ) 10 MG tablet Take 1 tablet (10 mg total) by mouth daily.   citalopram  (CELEXA ) 40 MG tablet Take 1 tablet (40 mg total) by mouth daily.   diphenhydrAMINE  (BENADRYL ) 25 MG tablet Take 25 mg by mouth at bedtime as needed (for allergies.).   FEROSUL 325 (65 Fe) MG tablet Take 325 mg by mouth daily.   fluticasone  (FLONASE ) 50 MCG/ACT  nasal spray Use 1 spray(s) in each nostril twice daily   glipiZIDE  (GLUCOTROL ) 10 MG tablet Take 1 tablet (10 mg total) by mouth daily before breakfast.   L-Lysine 500 MG CAPS Take 1 capsule by mouth daily.   lisinopril -hydrochlorothiazide  (ZESTORETIC ) 20-25 MG tablet Take 1 tablet by mouth daily.   lovastatin  (MEVACOR ) 40 MG tablet Take 1 tablet (40 mg total) by mouth at bedtime.   magnesium oxide (MAG-OX) 400 MG tablet Take 1 tablet (400 mg total) by mouth daily.   meclizine  (ANTIVERT ) 12.5 MG tablet Take 1 tablet (12.5 mg total) by mouth 3 (three) times daily as needed for dizziness.   potassium chloride  (KLOR-CON ) 10 MEQ tablet Take 1 tablet (10 mEq total) by mouth daily.   Semaglutide  (RYBELSUS ) 7 MG TABS Take 1 tablet (  7 mg total) by mouth daily.   traZODone  (DESYREL ) 50 MG tablet Take 1 tablet (50 mg total) by mouth at bedtime as needed. for sleep   triamcinolone  cream (KENALOG ) 0.1 % APPLY TOPICALLY TWICE DAILY   [DISCONTINUED] albuterol  (PROVENTIL ) (2.5 MG/3ML) 0.083% nebulizer solution Take 3 mLs (2.5 mg total) by nebulization every 2 (two) hours as needed for wheezing or shortness of breath.   [DISCONTINUED] albuterol  (VENTOLIN  HFA) 108 (90 Base) MCG/ACT inhaler Inhale 2 puffs into the lungs every 4 (four) hours as needed for wheezing or shortness of breath.   [DISCONTINUED] glipiZIDE  (GLUCOTROL ) 10 MG tablet Take 1 tablet (10 mg total) by mouth daily before breakfast.   [DISCONTINUED] lisinopril -hydrochlorothiazide  (ZESTORETIC ) 20-25 MG tablet Take 1 tablet by mouth daily.   No facility-administered encounter medications on file as of 02/08/2024.    Allergies  Allergen Reactions   Farxiga  [Dapagliflozin ]     Yeast infections   Sulfa Antibiotics Hives and Itching    welts    Pertinent ROS per HPI, otherwise unremarkable      Objective:  BP 128/78   Pulse 77   Temp (!) 96.7 F (35.9 C)   Ht 5' 3 (1.6 m)   Wt 164 lb 9.6 oz (74.7 kg)   SpO2 96%   BMI 29.16 kg/m     Wt Readings from Last 3 Encounters:  02/08/24 164 lb 9.6 oz (74.7 kg)  02/03/24 167 lb (75.8 kg)  01/25/24 167 lb 6.4 oz (75.9 kg)    Physical Exam Constitutional:      General: She is not in acute distress.    Appearance: Normal appearance. She is not ill-appearing, toxic-appearing or diaphoretic.  HENT:     Head: Normocephalic and atraumatic.     Nose: Nose normal.     Mouth/Throat:     Mouth: Mucous membranes are moist.  Eyes:     Pupils: Pupils are equal, round, and reactive to light.  Cardiovascular:     Rate and Rhythm: Normal rate and regular rhythm.     Heart sounds: Normal heart sounds.  Pulmonary:     Effort: Pulmonary effort is normal.     Breath sounds: Normal breath sounds.  Musculoskeletal:     Cervical back: Neck supple.     Right lower leg: No edema.     Left lower leg: No edema.  Skin:    General: Skin is warm and dry.     Capillary Refill: Capillary refill takes less than 2 seconds.  Neurological:     General: No focal deficit present.     Mental Status: She is alert and oriented to person, place, and time.  Psychiatric:        Mood and Affect: Mood normal.        Behavior: Behavior normal.        Thought Content: Thought content normal.        Judgment: Judgment normal.       Results for orders placed or performed in visit on 02/04/24  HM MAMMOGRAPHY   Collection Time: 01/14/24  7:47 AM  Result Value Ref Range   HM Mammogram 0-4 Bi-Rad 0-4 Bi-Rad, Self Reported Normal       Pertinent labs & imaging results that were available during my care of the patient were reviewed by me and considered in my medical decision making.  Assessment & Plan:  Reema Chick was seen today for medical management of chronic issues.  Diagnoses and all orders for this visit:  Type 2 diabetes mellitus with hyperglycemia, without long-term current use of insulin  (HCC) -     Bayer DCA Hb A1c Waived -     lisinopril -hydrochlorothiazide  (ZESTORETIC ) 20-25 MG  tablet; Take 1 tablet by mouth daily. -     glipiZIDE  (GLUCOTROL ) 10 MG tablet; Take 1 tablet (10 mg total) by mouth daily before breakfast. -     BMP8+EGFR -     Magnesium  Diabetes mellitus treated with oral medication (HCC) -     Bayer DCA Hb A1c Waived -     lisinopril -hydrochlorothiazide  (ZESTORETIC ) 20-25 MG tablet; Take 1 tablet by mouth daily. -     glipiZIDE  (GLUCOTROL ) 10 MG tablet; Take 1 tablet (10 mg total) by mouth daily before breakfast. -     BMP8+EGFR -     Magnesium  Hypertension associated with type 2 diabetes mellitus (HCC) -     Bayer DCA Hb A1c Waived -     lisinopril -hydrochlorothiazide  (ZESTORETIC ) 20-25 MG tablet; Take 1 tablet by mouth daily. -     BMP8+EGFR -     Magnesium  Hyperlipidemia associated with type 2 diabetes mellitus (HCC) -     Bayer DCA Hb A1c Waived -     BMP8+EGFR -     Magnesium  CKD stage 3 due to type 2 diabetes mellitus (HCC) -     Bayer DCA Hb A1c Waived -     BMP8+EGFR -     Magnesium  DDD (degenerative disc disease), thoracolumbar -     BMP8+EGFR  Hyponatremia -     BMP8+EGFR  Hypomagnesemia -     BMP8+EGFR -     Magnesium       Hyponatremia, resolved Hyponatremia has resolved following treatment with fluids in the emergency department. Dizziness has improved significantly. - Repeat sodium and magnesium levels today to ensure stability.  Type 2 diabetes mellitus Blood sugars have been running high, but recent fasting blood sugar was 109 mg/dL. Hemoglobin A1c is 6.1%, indicating good control. She is currently on Rybelsus  14 mg and glipizide  10 mg. No increased hunger, thirst, or urination reported. Fiber supplementation is helping with constipation. - Continue Rybelsus  14 mg and glipizide  10 mg. - Continue daily fiber supplementation for constipation.  Hypertension Blood pressure is well-controlled on current medication regimen. She is taking Zestoretic .  Hyperlipidemia Currently managed with lovastatin . No muscle  aches or pains reported related to lovastatin  use.  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 Pain is managed with Tylenol  Arthritis and topical treatments. Physical therapy is an option if symptoms worsen. - Continue Tylenol  Arthritis for pain management. - Use topical Voltaren as needed for pain. - Consider physical therapy if symptoms worsen.          Continue all other maintenance medications.  Follow up plan: Return in 3 months (on 05/10/2024), or if symptoms worsen or fail to improve, for DM.   Continue healthy lifestyle choices, including diet (rich in fruits, vegetables, and lean proteins, and low in salt and simple carbohydrates) and exercise (at least 30 minutes of moderate physical activity daily).  Educational handout given for DM  The above assessment and management plan was discussed with the patient. The patient verbalized understanding of and has agreed to the management plan. Patient is aware to call the clinic if they develop any new symptoms or if symptoms persist or worsen. Patient is aware when to return to the clinic for  a follow-up visit. Patient educated on when it is appropriate to go to the emergency department.   Rosaline Bruns, FNP-C [facility] [phone]

## 2024-02-08 NOTE — Patient Instructions (Addendum)

## 2024-02-09 ENCOUNTER — Ambulatory Visit: Payer: Self-pay | Admitting: Family Medicine

## 2024-02-09 ENCOUNTER — Telehealth: Payer: Self-pay | Admitting: Family Medicine

## 2024-02-09 DIAGNOSIS — E1165 Type 2 diabetes mellitus with hyperglycemia: Secondary | ICD-10-CM

## 2024-02-09 DIAGNOSIS — E871 Hypo-osmolality and hyponatremia: Secondary | ICD-10-CM

## 2024-02-09 DIAGNOSIS — E1122 Type 2 diabetes mellitus with diabetic chronic kidney disease: Secondary | ICD-10-CM

## 2024-02-09 LAB — BMP8+EGFR
BUN/Creatinine Ratio: 8 — ABNORMAL LOW (ref 12–28)
BUN: 10 mg/dL (ref 8–27)
CO2: 23 mmol/L (ref 20–29)
Calcium: 10.1 mg/dL (ref 8.7–10.3)
Chloride: 92 mmol/L — ABNORMAL LOW (ref 96–106)
Creatinine, Ser: 1.26 mg/dL — ABNORMAL HIGH (ref 0.57–1.00)
Glucose: 128 mg/dL — ABNORMAL HIGH (ref 70–99)
Potassium: 5 mmol/L (ref 3.5–5.2)
Sodium: 131 mmol/L — ABNORMAL LOW (ref 134–144)
eGFR: 45 mL/min/1.73 — ABNORMAL LOW (ref >59)

## 2024-02-09 LAB — MAGNESIUM: Magnesium: 1.5 mg/dL — AB (ref 1.6–2.3)

## 2024-02-09 NOTE — Telephone Encounter (Unsigned)
 Copied from CRM #[id number]. Topic: Referral - Question >> Feb 09, 2024 12:53 PM Lauren C wrote: Reason for CRM: Pt calling regarding endocrinology referral that was to be set up for her. She wants to know if she can have it sent to Dr. Littie Care with [facility]. Her husband is established with her.

## 2024-02-10 NOTE — Telephone Encounter (Signed)
 Called and spoke with patient and made her aware

## 2024-02-10 NOTE — Telephone Encounter (Signed)
 Referral redirected to [facility] as Patient requested.

## 2024-02-15 DIAGNOSIS — E871 Hypo-osmolality and hyponatremia: Secondary | ICD-10-CM | POA: Diagnosis not present

## 2024-02-15 DIAGNOSIS — E1165 Type 2 diabetes mellitus with hyperglycemia: Secondary | ICD-10-CM | POA: Diagnosis not present

## 2024-02-17 ENCOUNTER — Other Ambulatory Visit

## 2024-02-24 DIAGNOSIS — E871 Hypo-osmolality and hyponatremia: Secondary | ICD-10-CM | POA: Diagnosis not present

## 2024-03-03 NOTE — Progress Notes (Signed)
 Jeanne Medina                                          MRN: [id number]   03/03/2024   The [facility] Quality Team Specialist reviewed this patient medical record for the purposes of chart review for care gap closure. The following were reviewed: chart review for care gap closure-kidney health evaluation for diabetes:eGFR  and uACR.    [facility] Quality Team

## 2024-03-13 ENCOUNTER — Other Ambulatory Visit: Payer: Self-pay | Admitting: *Deleted

## 2024-03-13 DIAGNOSIS — L209 Atopic dermatitis, unspecified: Secondary | ICD-10-CM

## 2024-03-17 DIAGNOSIS — E871 Hypo-osmolality and hyponatremia: Secondary | ICD-10-CM | POA: Diagnosis not present

## 2024-03-17 DIAGNOSIS — E612 Magnesium deficiency: Secondary | ICD-10-CM | POA: Diagnosis not present

## 2024-03-17 DIAGNOSIS — E1165 Type 2 diabetes mellitus with hyperglycemia: Secondary | ICD-10-CM | POA: Diagnosis not present

## 2024-04-10 DIAGNOSIS — I10 Essential (primary) hypertension: Secondary | ICD-10-CM | POA: Diagnosis not present

## 2024-04-10 DIAGNOSIS — N189 Chronic kidney disease, unspecified: Secondary | ICD-10-CM | POA: Diagnosis not present

## 2024-04-10 DIAGNOSIS — D631 Anemia in chronic kidney disease: Secondary | ICD-10-CM | POA: Diagnosis not present

## 2024-04-10 DIAGNOSIS — E119 Type 2 diabetes mellitus without complications: Secondary | ICD-10-CM | POA: Diagnosis not present

## 2024-05-10 ENCOUNTER — Ambulatory Visit: Payer: Self-pay | Admitting: Family Medicine

## 2024-05-10 ENCOUNTER — Encounter: Payer: Self-pay | Admitting: Family Medicine

## 2024-05-10 ENCOUNTER — Ambulatory Visit

## 2024-05-10 VITALS — BP 137/56 | HR 86 | Temp 98.1°F | Ht 63.0 in | Wt 169.2 lb

## 2024-05-10 DIAGNOSIS — E785 Hyperlipidemia, unspecified: Secondary | ICD-10-CM | POA: Diagnosis not present

## 2024-05-10 DIAGNOSIS — I152 Hypertension secondary to endocrine disorders: Secondary | ICD-10-CM

## 2024-05-10 DIAGNOSIS — Z1212 Encounter for screening for malignant neoplasm of rectum: Secondary | ICD-10-CM | POA: Diagnosis not present

## 2024-05-10 DIAGNOSIS — E1122 Type 2 diabetes mellitus with diabetic chronic kidney disease: Secondary | ICD-10-CM

## 2024-05-10 DIAGNOSIS — E1159 Type 2 diabetes mellitus with other circulatory complications: Secondary | ICD-10-CM | POA: Diagnosis not present

## 2024-05-10 DIAGNOSIS — Z7984 Long term (current) use of oral hypoglycemic drugs: Secondary | ICD-10-CM

## 2024-05-10 DIAGNOSIS — N183 Chronic kidney disease, stage 3 unspecified: Secondary | ICD-10-CM

## 2024-05-10 DIAGNOSIS — E1169 Type 2 diabetes mellitus with other specified complication: Secondary | ICD-10-CM

## 2024-05-10 DIAGNOSIS — G479 Sleep disorder, unspecified: Secondary | ICD-10-CM

## 2024-05-10 DIAGNOSIS — E119 Type 2 diabetes mellitus without complications: Secondary | ICD-10-CM

## 2024-05-10 DIAGNOSIS — E1165 Type 2 diabetes mellitus with hyperglycemia: Secondary | ICD-10-CM

## 2024-05-10 DIAGNOSIS — R112 Nausea with vomiting, unspecified: Secondary | ICD-10-CM

## 2024-05-10 DIAGNOSIS — Z1211 Encounter for screening for malignant neoplasm of colon: Secondary | ICD-10-CM

## 2024-05-10 LAB — BASIC METABOLIC PANEL WITH GFR
BUN/Creatinine Ratio: 9 — ABNORMAL LOW (ref 12–28)
BUN: 11 mg/dL (ref 8–27)
CO2: 21 mmol/L (ref 20–29)
Calcium: 9.9 mg/dL (ref 8.7–10.3)
Chloride: 101 mmol/L (ref 96–106)
Creatinine, Ser: 1.22 mg/dL — ABNORMAL HIGH (ref 0.57–1.00)
Glucose: 124 mg/dL — ABNORMAL HIGH (ref 70–99)
Potassium: 3.9 mmol/L (ref 3.5–5.2)
Sodium: 141 mmol/L (ref 134–144)
eGFR: 47 mL/min/1.73 — ABNORMAL LOW

## 2024-05-10 LAB — BAYER DCA HB A1C WAIVED: HB A1C (BAYER DCA - WAIVED): 6.5 % — ABNORMAL HIGH (ref 4.8–5.6)

## 2024-05-10 LAB — MAGNESIUM: Magnesium: 2 mg/dL (ref 1.6–2.3)

## 2024-05-10 MED ORDER — LOVASTATIN 40 MG PO TABS: 40.0000 mg | ORAL_TABLET | Freq: Every day | ORAL | 1 refills | Status: AC

## 2024-05-10 MED ORDER — AMLODIPINE BESYLATE 10 MG PO TABS: 10.0000 mg | ORAL_TABLET | Freq: Every day | ORAL | 1 refills | Status: AC

## 2024-05-10 MED ORDER — RYBELSUS 7 MG PO TABS: 7.0000 mg | ORAL_TABLET | Freq: Every day | ORAL | 1 refills | Status: DC

## 2024-05-10 MED ORDER — RYBELSUS 14 MG PO TABS: 14.0000 mg | ORAL_TABLET | Freq: Every day | ORAL | 3 refills | Status: AC

## 2024-05-10 MED ORDER — TRAZODONE HCL 50 MG PO TABS: 50.0000 mg | ORAL_TABLET | Freq: Every evening | ORAL | 1 refills | Status: AC | PRN

## 2024-05-10 NOTE — Progress Notes (Signed)
 "    Subjective:  Patient ID: Jeanne Medina, female    DOB: 07-Jun-1950, 74 y.o.   MRN: [id number]  Patient Care Team: Severa Rock HERO, FNP as PCP - General (Family Medicine) Billee Mliss BIRCH, RPH-CPP (Pharmacist) Ladora Ross Lacy Phebe, MD as Referring Physician (Optometry)   Chief Complaint:  Diabetes (3 month follow up )   HPI: Jeanne Medina is a 74 y.o. female presenting on 05/10/2024 for Diabetes (3 month follow up )   The patient presents for management of hypertension and diabetes.  The patient has recently experienced a change in their hypertension medication regimen due to low magnesium , potassium, and sodium levels. They are currently taking lisinopril  20 mg and amlodipine  10 mg. No swelling, headaches, chest pain, or leg swelling are reported.  For diabetes management, they are on Rybelsus  14 mg and glipizide  10 mg. Rybelsus  is effective but expensive, and they have experienced elevated blood sugars. They have a history of allergy to Farxiga , which caused yeast infections. Metformin  was previously used without issues but is not an option now due to renal function. Injectable medications for diabetes have not been tried.  They deny any changes in urine output and report no issues with their cholesterol medications. Trazodone  is taken for sleep and is effective. Aside from needing a pedicure, the patient does not report any issues with their feet.          Relevant past medical, surgical, family, and social history reviewed and updated as indicated.  Allergies and medications reviewed and updated. Data reviewed: Chart in Epic.   Past Medical History:  Diagnosis Date   Anxiety    Arthritis    osteoarthritis   Asthma    CKD (chronic kidney disease) stage 3, GFR 30-59 ml/min (HCC)    Diabetes mellitus without complication (HCC)    Hypercholesteremia    Hypertension     Past Surgical History:  Procedure Laterality Date   CATARACT EXTRACTION W/PHACO Left 08/18/2016    Procedure: CATARACT EXTRACTION PHACO AND INTRAOCULAR LENS PLACEMENT (IOC);  Surgeon: Oneil Platts, MD;  Location: [facility];  Service: Ophthalmology;  Laterality: Left;  CDE: 6.05   CATARACT EXTRACTION W/PHACO Left 09/01/2016   Procedure: REPOSITIONING OF LEFT IOL LENS;  Surgeon: Platts Oneil, MD;  Location: [facility];  Service: Ophthalmology;  Laterality: Left;   CATARACT EXTRACTION W/PHACO Right 10/06/2016   Procedure: CATARACT EXTRACTION PHACO AND INTRAOCULAR LENS PLACEMENT (IOC);  Surgeon: Platts Oneil, MD;  Location: [facility];  Service: Ophthalmology;  Laterality: Right;  CDE: 6.21    Social History   Socioeconomic History   Marital status: Married    Spouse name: Christopher   Number of children: 0   Years of education: Not on file   Highest education level: 12th grade  Occupational History   Occupation: retired  Tobacco Use   Smoking status: Former    Current packs/day: 0.00    Average packs/day: 0.3 packs/day for 30.0 years (10.0 ttl pk-yrs)    Types: Cigarettes    Start date: 08/13/1960    Quit date: 08/13/1980    Years since quitting: 43.7   Smokeless tobacco: Never  Vaping Use   Vaping status: Never Used  Substance and Sexual Activity   Alcohol use: No   Drug use: No   Sexual activity: Yes    Birth control/protection: Post-menopausal  Other Topics Concern   Not on file  Social History Narrative   Lives home with her husband. Works out at [employer]  min 3x per week and walks 30 minutes other days.   Sister lives in [place]   Social Drivers of Health   Tobacco Use: Medium Risk (05/10/2024)   Patient History    Smoking Tobacco Use: Former    Smokeless Tobacco Use: Never    Passive Exposure: Not on file  Financial Resource Strain: Medium Risk (02/06/2024)   Overall Financial Resource Strain (CARDIA)    Difficulty of Paying Living Expenses: Somewhat hard  Food Insecurity: No Food Insecurity (02/06/2024)   Epic    Worried About Programme Researcher, Broadcasting/film/video in the Last Year: Never true     Ran Out of Food in the Last Year: Never true  Transportation Needs: No Transportation Needs (02/06/2024)   Epic    Lack of Transportation (Medical): No    Lack of Transportation (Non-Medical): No  Physical Activity: Sufficiently Active (02/06/2024)   Exercise Vital Sign    Days of Exercise per Week: 3 days    Minutes of Exercise per Session: 60 min  Stress: No Stress Concern Present (02/06/2024)   [employer] of Occupational Health - Occupational Stress Questionnaire    Feeling of Stress: Not at all  Social Connections: Moderately Integrated (02/06/2024)   Social Connection and Isolation Panel    Frequency of Communication with Friends and Family: Once a week    Frequency of Social Gatherings with Friends and Family: Never    Attends Religious Services: More than 4 times per year    Active Member of Clubs or Organizations: Yes    Attends Banker Meetings: More than 4 times per year    Marital Status: Married  Catering Manager Violence: Not At Risk (11/08/2023)   Epic    Fear of Current or Ex-Partner: No    Emotionally Abused: No    Physically Abused: No    Sexually Abused: No  Depression (PHQ2-9): Low Risk (05/10/2024)   Depression (PHQ2-9)    PHQ-2 Score: 0  Alcohol Screen: Low Risk (11/08/2023)   Alcohol Screen    Last Alcohol Screening Score (AUDIT): 0  Housing: Low Risk (02/06/2024)   Epic    Unable to Pay for Housing in the Last Year: No    Number of Times Moved in the Last Year: 0    Homeless in the Last Year: No  Utilities: Not At Risk (11/08/2023)   Epic    Threatened with loss of utilities: No  Health Literacy: Adequate Health Literacy (11/08/2023)   B1300 Health Literacy    Frequency of need for help with medical instructions: Never    Outpatient Encounter Medications as of 05/10/2024  Medication Sig   acetaminophen  (TYLENOL ) 325 MG tablet Take 2 tablets (650 mg total) by mouth every 6 (six) hours as needed for mild pain (or Fever >/= 101).    amLODipine  (NORVASC ) 10 MG tablet Take 1 tablet (10 mg total) by mouth daily.   citalopram  (CELEXA ) 40 MG tablet Take 1 tablet (40 mg total) by mouth daily.   FEROSUL 325 (65 Fe) MG tablet Take 325 mg by mouth daily.   fluticasone  (FLONASE ) 50 MCG/ACT nasal spray Use 1 spray(s) in each nostril twice daily   glipiZIDE  (GLUCOTROL ) 10 MG tablet Take 1 tablet (10 mg total) by mouth daily before breakfast.   L-Lysine 500 MG CAPS Take 1 capsule by mouth daily.   lisinopril  (ZESTRIL ) 20 MG tablet Take 20 mg by mouth daily.   lovastatin  (MEVACOR ) 40 MG tablet Take 1 tablet (40 mg total) by mouth  at bedtime.   magnesium  oxide (MAG-OX) 400 MG tablet Take 1 tablet (400 mg total) by mouth daily.   meclizine  (ANTIVERT ) 12.5 MG tablet Take 1 tablet (12.5 mg total) by mouth 3 (three) times daily as needed for dizziness.   potassium chloride  (KLOR-CON ) 10 MEQ tablet Take 1 tablet (10 mEq total) by mouth daily.   Semaglutide  (RYBELSUS ) 14 MG TABS Take 1 tablet (14 mg total) by mouth daily.   traZODone  (DESYREL ) 50 MG tablet Take 1 tablet (50 mg total) by mouth at bedtime as needed. for sleep   triamcinolone  cream (KENALOG ) 0.1 % APPLY TOPICLLY TO AFFECTED AREA TWICE DAILY   [DISCONTINUED] Semaglutide  (RYBELSUS ) 7 MG TABS Take 1 tablet (7 mg total) by mouth daily. (Patient taking differently: Take 14 mg by mouth daily.)   [DISCONTINUED] amLODipine  (NORVASC ) 10 MG tablet Take 1 tablet (10 mg total) by mouth daily.   [DISCONTINUED] diphenhydrAMINE  (BENADRYL ) 25 MG tablet Take 25 mg by mouth at bedtime as needed (for allergies.).   [DISCONTINUED] lisinopril -hydrochlorothiazide  (ZESTORETIC ) 20-25 MG tablet Take 1 tablet by mouth daily.   [DISCONTINUED] lovastatin  (MEVACOR ) 40 MG tablet Take 1 tablet (40 mg total) by mouth at bedtime.   [DISCONTINUED] Semaglutide  (RYBELSUS ) 7 MG TABS Take 1 tablet (7 mg total) by mouth daily.   [DISCONTINUED] traZODone  (DESYREL ) 50 MG tablet Take 1 tablet (50 mg total) by mouth at  bedtime as needed. for sleep   No facility-administered encounter medications on file as of 05/10/2024.    Allergies[1]  Pertinent ROS per HPI, otherwise unremarkable      Objective:  BP (!) 137/56   Pulse 86   Temp 98.1 F (36.7 C)   Ht 5' 3 (1.6 m)   Wt 169 lb 3.2 oz (76.7 kg)   SpO2 96%   BMI 29.97 kg/m    Wt Readings from Last 3 Encounters:  05/10/24 169 lb 3.2 oz (76.7 kg)  02/08/24 164 lb 9.6 oz (74.7 kg)  02/03/24 167 lb (75.8 kg)    Physical Exam Vitals and nursing note reviewed.  Constitutional:      General: She is not in acute distress.    Appearance: Normal appearance. She is well-developed and well-groomed. She is not ill-appearing, toxic-appearing or diaphoretic.  HENT:     Head: Normocephalic and atraumatic.     Jaw: There is normal jaw occlusion.     Right Ear: Hearing normal.     Left Ear: Hearing normal.     Nose: Nose normal.     Mouth/Throat:     Lips: Pink.     Mouth: Mucous membranes are moist.     Pharynx: Uvula midline.  Eyes:     General: Lids are normal.     Pupils: Pupils are equal, round, and reactive to light.  Neck:     Trachea: Trachea and phonation normal.  Cardiovascular:     Rate and Rhythm: Normal rate and regular rhythm.     Chest Wall: PMI is not displaced.     Pulses: Normal pulses.          Dorsalis pedis pulses are 2+ on the right side and 2+ on the left side.       Posterior tibial pulses are 2+ on the right side and 2+ on the left side.     Heart sounds: Normal heart sounds. No murmur heard.    No friction rub. No gallop.  Pulmonary:     Effort: Pulmonary effort is normal. No respiratory distress.  Breath sounds: Normal breath sounds. No wheezing.  Abdominal:     General: Bowel sounds are normal.     Palpations: Abdomen is soft.  Musculoskeletal:        General: Normal range of motion.     Cervical back: Normal range of motion and neck supple.     Right lower leg: No edema.     Left lower leg: No edema.      Right foot: Normal range of motion. No deformity, bunion, Charcot foot, foot drop or prominent metatarsal heads.     Left foot: Normal range of motion. No deformity, bunion, Charcot foot, foot drop or prominent metatarsal heads.  Feet:     Right foot:     Protective Sensation: 10 sites tested.  10 sites sensed.     Skin integrity: Skin integrity normal.     Left foot:     Protective Sensation: 10 sites tested.  10 sites sensed.     Skin integrity: Skin integrity normal.  Skin:    General: Skin is warm and dry.     Capillary Refill: Capillary refill takes less than 2 seconds.     Coloration: Skin is not cyanotic, jaundiced or pale.     Findings: No rash.  Neurological:     General: No focal deficit present.     Mental Status: She is alert and oriented to person, place, and time.     Sensory: Sensation is intact.     Motor: Motor function is intact.     Coordination: Coordination is intact.     Gait: Gait is intact.     Deep Tendon Reflexes: Reflexes are normal and symmetric.  Psychiatric:        Attention and Perception: Attention and perception normal.        Mood and Affect: Mood and affect normal.        Speech: Speech normal.        Behavior: Behavior normal. Behavior is cooperative.        Thought Content: Thought content normal.        Cognition and Memory: Cognition and memory normal.        Judgment: Judgment normal.      Results for orders placed or performed in visit on 02/08/24  Bayer DCA Hb A1c Waived   Collection Time: 02/08/24  8:07 AM  Result Value Ref Range   HB A1C (BAYER DCA - WAIVED) 6.1 (H) 4.8 - 5.6 %  BMP8+EGFR   Collection Time: 02/08/24  8:09 AM  Result Value Ref Range   Glucose 128 (H) 70 - 99 mg/dL   BUN 10 8 - 27 mg/dL   Creatinine, Ser 8.73 (H) 0.57 - 1.00 mg/dL   eGFR 45 (L) >40 fO/fpw/8.26   BUN/Creatinine Ratio 8 (L) 12 - 28   Sodium 131 (L) 134 - 144 mmol/L   Potassium 5.0 3.5 - 5.2 mmol/L   Chloride 92 (L) 96 - 106 mmol/L   CO2 23 20  - 29 mmol/L   Calcium 10.1 8.7 - 10.3 mg/dL  Magnesium    Collection Time: 02/08/24  8:09 AM  Result Value Ref Range   Magnesium  1.5 (L) 1.6 - 2.3 mg/dL       Pertinent labs & imaging results that were available during my care of the patient were reviewed by me and considered in my medical decision making.  Assessment & Plan:  Jeanne Medina was seen today for diabetes.  Diagnoses and all orders for this visit:  Type 2 diabetes mellitus with hyperglycemia, without long-term current use of insulin  (HCC) -     Bayer DCA Hb A1c Waived -     Magnesium  -     Basic metabolic panel with GFR -     lovastatin  (MEVACOR ) 40 MG tablet; Take 1 tablet (40 mg total) by mouth at bedtime. -     Discontinue: Semaglutide  (RYBELSUS ) 7 MG TABS; Take 1 tablet (7 mg total) by mouth daily. (Patient taking differently: Take 14 mg by mouth daily.) -     Microalbumin / creatinine urine ratio -     Semaglutide  (RYBELSUS ) 14 MG TABS; Take 1 tablet (14 mg total) by mouth daily.  Hypertension associated with type 2 diabetes mellitus (HCC) -     Bayer DCA Hb A1c Waived -     Magnesium  -     Basic metabolic panel with GFR -     amLODipine  (NORVASC ) 10 MG tablet; Take 1 tablet (10 mg total) by mouth daily. -     Microalbumin / creatinine urine ratio -     Semaglutide  (RYBELSUS ) 14 MG TABS; Take 1 tablet (14 mg total) by mouth daily.  Hyperlipidemia associated with type 2 diabetes mellitus (HCC) -     Bayer DCA Hb A1c Waived -     Magnesium  -     Basic metabolic panel with GFR -     lovastatin  (MEVACOR ) 40 MG tablet; Take 1 tablet (40 mg total) by mouth at bedtime. -     Semaglutide  (RYBELSUS ) 14 MG TABS; Take 1 tablet (14 mg total) by mouth daily.  Difficulty sleeping -     traZODone  (DESYREL ) 50 MG tablet; Take 1 tablet (50 mg total) by mouth at bedtime as needed. for sleep  Screening for colorectal cancer -     Cologuard  CKD stage 3 due to type 2 diabetes mellitus (HCC) -     Bayer DCA Hb A1c Waived -      Magnesium  -     Basic metabolic panel with GFR -     Semaglutide  (RYBELSUS ) 14 MG TABS; Take 1 tablet (14 mg total) by mouth daily.       Type 2 diabetes mellitus with chronic kidney disease stage 3, hypertension, and hyperlipidemia Type 2 diabetes mellitus with elevated blood sugars despite Rybelsus  14 mg. Allergic to Farxiga  due to yeast infections. Limited options due to insurance coverage and renal function. A1c is 6.5, indicating good control. Hypertension managed with lisinopril  20 mg and amlodipine  10 mg. No swelling, headaches, or chest pain. Hyperlipidemia managed with cholesterol medications without issues. - Continue Rybelsus  14 mg and monitor cost. - Sent prescription for semaglutide  to [facility] in [place]. - Ordered urine test for proteinuria. - Scheduled follow-up in 3 months.  Sleep disorder Managed with trazodone , which is effective. - Continue trazodone  for sleep.  General Health Maintenance Routine health maintenance discussed. Cologuard screening is due. Eye exam scheduled for January. - Ordered Cologuard screening. - Ensure eye exam is scheduled for January.          Continue all other maintenance medications.  Follow up plan: Return in about 3 months (around 08/08/2024) for 3 mths DM, 6 mths CPE.   Continue healthy lifestyle choices, including diet (rich in fruits, vegetables, and lean proteins, and low in salt and simple carbohydrates) and exercise (at least 30 minutes of moderate physical activity daily).  Educational handout given for DM  The above assessment and management plan was discussed with the patient.  The patient verbalized understanding of and has agreed to the management plan. Patient is aware to call the clinic if they develop any new symptoms or if symptoms persist or worsen. Patient is aware when to return to the clinic for a follow-up visit. Patient educated on when it is appropriate to go to the emergency department.   Rosaline Bruns,  FNP-C [facility] [phone]     [1]  Allergies Allergen Reactions   Farxiga  [Dapagliflozin ]     Yeast infections   Sulfa Antibiotics Hives and Itching    welts   "

## 2024-05-10 NOTE — Patient Instructions (Signed)

## 2024-05-11 ENCOUNTER — Telehealth: Payer: Self-pay | Admitting: Pharmacist

## 2024-05-11 ENCOUNTER — Telehealth: Payer: Self-pay | Admitting: Family Medicine

## 2024-05-11 DIAGNOSIS — E1165 Type 2 diabetes mellitus with hyperglycemia: Secondary | ICD-10-CM

## 2024-05-11 LAB — MICROALBUMIN / CREATININE URINE RATIO
Creatinine, Urine: 27.9 mg/dL
Microalb/Creat Ratio: 43 mg/g{creat} — ABNORMAL HIGH (ref 0–29)
Microalbumin, Urine: 11.9 ug/mL

## 2024-05-11 NOTE — Telephone Encounter (Signed)
 New [id number] placed for Pharmacy med assist/management Will need alternative to Rybelsus  Will follow  Mliss Tarry Griffin, PharmD, BCACP, CPP Clinical Pharmacist, [facility]

## 2024-05-11 NOTE — Telephone Encounter (Signed)
 Copied from CRM [id number]. Topic: Clinical - Lab/Test Results >> May 11, 2024  9:05 AM Ivette P wrote: Reason for CRM: Pt called in due to missed call regarding labs.   Read as follows:  Severa Rock HERO, FNP to [facility] Clinical     05/11/24  7:45 AM Result Note Renal function remains declined but stable. Continue to avoid NSAIDs. Stay hydrated.   Pt understood, no further questions.

## 2024-05-11 NOTE — Telephone Encounter (Signed)
 NOTED

## 2024-05-12 ENCOUNTER — Telehealth: Payer: Self-pay

## 2024-05-12 NOTE — Progress Notes (Signed)
 Care Guide Pharmacy Note  05/12/2024 Name: Jeanne Medina MRN: [id number] DOB: 01-19-1951  Referred By: Severa Rock HERO, FNP Reason for referral: Complex Care Management (Outreach to schedule with Pharm d )   Jeanne Medina is a 74 y.o. year old female who is a primary care patient of Rakes, Rock HERO, FNP.  Jeanne Medina was referred to the pharmacist for assistance related to: DMII  An unsuccessful telephone outreach was attempted today to contact the patient who was referred to the pharmacy team for assistance with medication assistance. Additional attempts will be made to contact the patient.  Jeoffrey Buffalo , RMA     [facility]  [facility], [facility] Guide  Direct Dial: [phone]  Website: [email or web address]

## 2024-05-12 NOTE — Progress Notes (Signed)
 Care Guide Pharmacy Note  05/12/2024 Name: ROBBIE NANGLE MRN: [id number] DOB: 1950-08-06  Referred By: Severa Rock HERO, FNP Reason for referral: Complex Care Management (Outreach to schedule with Pharm d )   KAYLEN MOTL is a 74 y.o. year old female who is a primary care patient of Rakes, Rock HERO, FNP.  Saia K Mcmanaway was referred to the pharmacist for assistance related to: DMII  Successful contact was made with the patient to discuss pharmacy services including being ready for the pharmacist to call at least 5 minutes before the scheduled appointment time and to have medication bottles and any blood pressure readings ready for review. The patient agreed to meet with the pharmacist via telephone visit on (date/time).06/01/2024  Jeoffrey Buffalo , RMA     [place]  [facility], [facility] Guide  Direct Dial: [phone]  Website: [email or web address]

## 2024-05-22 LAB — COLOGUARD: COLOGUARD: NEGATIVE

## 2024-05-29 ENCOUNTER — Other Ambulatory Visit: Payer: Self-pay | Admitting: Family Medicine

## 2024-05-29 DIAGNOSIS — L209 Atopic dermatitis, unspecified: Secondary | ICD-10-CM

## 2024-06-01 ENCOUNTER — Telehealth: Payer: Self-pay | Admitting: Family Medicine

## 2024-06-01 ENCOUNTER — Telehealth: Payer: Self-pay | Admitting: Pharmacist

## 2024-06-01 ENCOUNTER — Other Ambulatory Visit: Admitting: Pharmacist

## 2024-06-01 DIAGNOSIS — E1122 Type 2 diabetes mellitus with diabetic chronic kidney disease: Secondary | ICD-10-CM

## 2024-06-01 DIAGNOSIS — E1165 Type 2 diabetes mellitus with hyperglycemia: Secondary | ICD-10-CM

## 2024-06-01 DIAGNOSIS — E1169 Type 2 diabetes mellitus with other specified complication: Secondary | ICD-10-CM

## 2024-06-01 DIAGNOSIS — E1159 Type 2 diabetes mellitus with other circulatory complications: Secondary | ICD-10-CM

## 2024-06-01 NOTE — Telephone Encounter (Signed)
 Please route [facility] PAP to me for new start Trulicity 1.5mg  weekly. Patient was previously enrolled in the [employer] PAP for Rybelsus .

## 2024-06-01 NOTE — Telephone Encounter (Signed)
 Returned call to patient

## 2024-06-01 NOTE — Telephone Encounter (Signed)
 Pt called back and stated that she wanted to let you that Christopher her spouse was approved for the medication.

## 2024-06-01 NOTE — Progress Notes (Signed)
 "  06/01/2024 Name: Jeanne Medina MRN: [id number] DOB: 24-Oct-1950  Chief Complaint  Patient presents with   Diabetes    Jeanne Medina is a 74 y.o. year old female who presented for a telephone visit.   They were referred to the pharmacist by their PCP for assistance in managing diabetes and medication access.    Subjective:  Care Team: Primary Care Provider: Severa Rock HERO, FNP   Medication Access/Adherence  Current Pharmacy:  [facility] [address] [place] - [address] [place] [address] Phone: [phone] Fax: [phone]   Patient reports affordability concerns with their medications: Yes  Patient reports access/transportation concerns to their pharmacy: No  Patient reports adherence concerns with their medications:  No     Diabetes:  Current medications: Rybelsus  7mg  daily, glipizide  10mg  with breakfast Medications tried in the past:  Intolerances-patient has tried SGLT2/Farxiga  x2, however patient continues to have yeast infections  Metformin  stopped due to kidneys/GFR 40s Januvia -cost/not as effective  Current glucose readings: FBG<130, within goal   Patient denies hypoglycemic s/sx including dizziness, shakiness, sweating. Patient denies hyperglycemic symptoms including polyuria, polydipsia, polyphagia, nocturia, neuropathy, blurred vision.  Current meal patterns:  Discussed meal planning options and Plate method for healthy eating Avoid sugary drinks and desserts Incorporate balanced protein, non starchy veggies, 1 serving of carbohydrate with each meal Increase water intake Increase physical activity as able  Current physical activity: active/encouraged  Current medication access support: novo PAP-->Lilly PAP  Macrovascular and Microvascular Risk Reduction:  Statin? yes (lovastatin ); ACEi/ARB? yes (lisinopril ) Last urinary albumin/creatinine ratio:  Lab Results  Component Value Date   MICRALBCREAT 43 (H) 05/10/2024    MICRALBCREAT 13 04/13/2023   MICRALBCREAT 13 04/03/2022   MICRALBCREAT 43 (H) 02/18/2021   MICRALBCREAT 14 01/02/2020   MICRALBCREAT 111 (H) 11/04/2018   Last eye exam:  Lab Results  Component Value Date   HMDIABEYEEXA No Retinopathy 06/21/2022   Last foot exam: 05/10/2024 Tobacco Use:  Tobacco Use: Medium Risk (05/10/2024)   Patient History    Smoking Tobacco Use: Former    Smokeless Tobacco Use: Never    Passive Exposure: Not on file     Objective:  Lab Results  Component Value Date   HGBA1C 6.5 (H) 05/10/2024    Lab Results  Component Value Date   CREATININE 1.22 (H) 05/10/2024   BUN 11 05/10/2024   NA 141 05/10/2024   K 3.9 05/10/2024   CL 101 05/10/2024   CO2 21 05/10/2024    Lab Results  Component Value Date   CHOL 167 12/25/2021   HDL 70 12/25/2021   LDLCALC 76 12/25/2021   TRIG 123 12/25/2021   CHOLHDL 2.4 12/25/2021    Medications Reviewed Today     Reviewed by Billee Mliss BIRCH, RPH-CPP (Pharmacist) on 06/01/24 at 1302  Med List Status: <None>   Medication Order Taking? Sig Documenting Provider Last Dose Status Informant  acetaminophen  (TYLENOL ) 325 MG tablet [id number]  Take 2 tablets (650 mg total) by mouth every 6 (six) hours as needed for mild pain (or Fever >/= 101). Pearlean Manus, MD  Active   amLODipine  (NORVASC ) 10 MG tablet [id number]  Take 1 tablet (10 mg total) by mouth daily. Severa Rock HERO, FNP  Active   citalopram  (CELEXA ) 40 MG tablet [id number]  Take 1 tablet (40 mg total) by mouth daily. Severa Rock HERO, FNP  Active   FEROSUL 325 (65 Fe) MG tablet [id number]  Take 325 mg by  mouth daily. [provider]  Active   fluticasone  (FLONASE ) 50 MCG/ACT nasal spray [id number]  Use 1 spray(s) in each nostril twice daily Rakes, Rock HERO, FNP  Active   glipiZIDE  (GLUCOTROL ) 10 MG tablet [id number]  Take 1 tablet (10 mg total) by mouth daily before breakfast. Severa Rock HERO, FNP  Active   L-Lysine 500 MG CAPS [id number]  Take 1 capsule by mouth  daily. [provider]  Active Self  lisinopril  (ZESTRIL ) 20 MG tablet [id number]  Take 20 mg by mouth daily. [provider]  Active   lovastatin  (MEVACOR ) 40 MG tablet [id number]  Take 1 tablet (40 mg total) by mouth at bedtime. Severa Rock HERO, FNP  Active   magnesium  oxide (MAG-OX) 400 MG tablet [id number]  Take 1 tablet (400 mg total) by mouth daily. Cleotilde Rogue, MD  Active   meclizine  (ANTIVERT ) 12.5 MG tablet [id number]  Take 1 tablet (12.5 mg total) by mouth 3 (three) times daily as needed for dizziness. Severa Rock HERO, FNP  Active   potassium chloride  (KLOR-CON ) 10 MEQ tablet [id number]  Take 1 tablet (10 mEq total) by mouth daily. Cleotilde Rogue, MD  Active   Semaglutide  (RYBELSUS ) 14 MG TABS [id number]  Take 1 tablet (14 mg total) by mouth daily. Severa Rock HERO, FNP  Active   traZODone  (DESYREL ) 50 MG tablet [id number]  Take 1 tablet (50 mg total) by mouth at bedtime as needed. for sleep Severa Rock HERO, FNP  Active   triamcinolone  cream (KENALOG ) 0.1 % [id number]  APPLY  CREAM EXTERNALLY TO AFFECTED AREA TWICE DAILY Rakes, Rock HERO, FNP  Active               Assessment/Plan:   Diabetes: - Currently controlled; goal A1c <7%. Cardiorenal risk reduction is optimized.. Blood pressure is at goal <130/80. LDL is at goal.  - Reviewed long term cardiovascular and renal outcomes of uncontrolled blood sugar. and Reviewed goal A1c, goal fasting, and goal 2 hour post prandial glucose. Recommended to check glucose FBG or if symptomatic - Patient denies personal or family history of multiple endocrine neoplasia type 2, medullary thyroid  cancer; personal history of pancreatitis or gallbladder disease., Discussed side effects of gastrointestinal upset/nausea; eating smaller meals, avoiding high-fat foods, and remaining upright after eating may reduce nausea. Discussed that overeating is a major trigger of nausea with this class of medications, as often times patients will start to feel  full sooner and may need to decrease portion sizes from what they were previously accustomed to.  Plan to start Trulicity 1.5mg  weekly after patient finishes her Rybelsus  7mg  daily supply Discontinue glipizide  when starting Trulicity Will update medication list once changes take place  Follow Up Plan: 6 weeks PharmD  Mliss Tarry Griffin, PharmD, BCACP, CPP Clinical Pharmacist, [facility]   "

## 2024-06-01 NOTE — Telephone Encounter (Signed)
 Copied from CRM [id number]. Topic: Appointments - Other >> Jun 01, 2024  9:26 AM Travis F wrote: Patient is calling in because she was supposed to have a phone call from the clinic pharmacist, but hasn't been contacted. Patient wants to know if she was going to get a call. Please advise.

## 2024-09-13 ENCOUNTER — Encounter: Admitting: Family Medicine

## 2024-11-08 ENCOUNTER — Ambulatory Visit: Payer: Self-pay
